# Patient Record
Sex: Male | Born: 1955
Health system: Southern US, Community
[De-identification: ages and names within clinical notes are randomized; demographics above are authoritative.]

## PROBLEM LIST (undated history)

## (undated) DIAGNOSIS — F419 Anxiety disorder, unspecified: Secondary | ICD-10-CM

## (undated) DIAGNOSIS — E782 Mixed hyperlipidemia: Secondary | ICD-10-CM

## (undated) DIAGNOSIS — I10 Essential (primary) hypertension: Secondary | ICD-10-CM

## (undated) DIAGNOSIS — F329 Major depressive disorder, single episode, unspecified: Secondary | ICD-10-CM

## (undated) DIAGNOSIS — K219 Gastro-esophageal reflux disease without esophagitis: Secondary | ICD-10-CM

## (undated) DIAGNOSIS — I5022 Chronic systolic (congestive) heart failure: Secondary | ICD-10-CM

## (undated) DIAGNOSIS — F32A Depression, unspecified: Secondary | ICD-10-CM

## (undated) DIAGNOSIS — E6609 Other obesity due to excess calories: Secondary | ICD-10-CM

## (undated) DIAGNOSIS — E119 Type 2 diabetes mellitus without complications: Secondary | ICD-10-CM

## (undated) DIAGNOSIS — I509 Heart failure, unspecified: Secondary | ICD-10-CM

## (undated) DIAGNOSIS — R9431 Abnormal electrocardiogram [ECG] [EKG]: Secondary | ICD-10-CM

## (undated) DIAGNOSIS — I2510 Atherosclerotic heart disease of native coronary artery without angina pectoris: Secondary | ICD-10-CM

## (undated) HISTORY — DX: Anxiety disorder, unspecified: F41.9

## (undated) HISTORY — DX: Gastro-esophageal reflux disease without esophagitis: K21.9

## (undated) HISTORY — DX: Mixed hyperlipidemia: E78.2

## (undated) HISTORY — PX: HAND SURGERY: SHX662

## (undated) HISTORY — PX: ANKLE FRACTURE SURGERY: SHX122

## (undated) HISTORY — DX: Depression, unspecified: F32.A

## (undated) HISTORY — DX: Atherosclerotic heart disease of native coronary artery without angina pectoris: I25.10

## (undated) HISTORY — DX: Essential (primary) hypertension: I10

## (undated) HISTORY — DX: Chronic systolic (congestive) heart failure: I50.22

## (undated) HISTORY — DX: Type 2 diabetes mellitus without complications: E11.9

## (undated) HISTORY — PX: CARDIAC CATHETERIZATION: SHX172

## (undated) HISTORY — PX: CHOLECYSTECTOMY: SHX55

## (undated) HISTORY — DX: Other obesity due to excess calories: E66.09

## (undated) HISTORY — DX: Abnormal electrocardiogram (ECG) (EKG): R94.31

## (undated) HISTORY — DX: Heart failure, unspecified: I50.9

---
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
## 2005-08-05 ENCOUNTER — Ambulatory Visit: Payer: Self-pay | Admitting: Internal Medicine

## 2005-08-12 ENCOUNTER — Ambulatory Visit: Payer: Self-pay | Admitting: Gastroenterology

## 2005-08-30 ENCOUNTER — Ambulatory Visit: Payer: Self-pay | Admitting: Gastroenterology

## 2005-09-05 ENCOUNTER — Ambulatory Visit: Payer: Self-pay | Admitting: Internal Medicine

## 2005-10-17 ENCOUNTER — Ambulatory Visit: Payer: Self-pay | Admitting: Internal Medicine

## 2005-10-21 ENCOUNTER — Ambulatory Visit: Payer: Self-pay | Admitting: Family Medicine

## 2005-12-23 ENCOUNTER — Ambulatory Visit: Payer: Self-pay | Admitting: Internal Medicine

## 2006-02-20 ENCOUNTER — Ambulatory Visit: Payer: Self-pay | Admitting: Internal Medicine

## 2006-05-21 ENCOUNTER — Ambulatory Visit: Payer: Self-pay | Admitting: Internal Medicine

## 2006-10-10 ENCOUNTER — Ambulatory Visit: Payer: Self-pay | Admitting: Internal Medicine

## 2006-11-14 ENCOUNTER — Ambulatory Visit: Payer: Self-pay | Admitting: Internal Medicine

## 2007-03-03 ENCOUNTER — Emergency Department: Payer: Self-pay | Admitting: Emergency Medicine

## 2007-03-19 ENCOUNTER — Emergency Department: Payer: Self-pay | Admitting: Emergency Medicine

## 2010-04-30 ENCOUNTER — Ambulatory Visit: Payer: Self-pay

## 2010-05-16 ENCOUNTER — Ambulatory Visit: Payer: Self-pay

## 2010-06-15 ENCOUNTER — Ambulatory Visit: Payer: Self-pay

## 2010-07-26 ENCOUNTER — Encounter (INDEPENDENT_AMBULATORY_CARE_PROVIDER_SITE_OTHER): Payer: Self-pay | Admitting: *Deleted

## 2011-01-15 NOTE — Letter (Signed)
Summary: Colonoscopy Letter  [facility]  [address] [place], [place] [address]   Phone: [phone]  Fax: [phone]      July 26, 2010 MRN: [id number]   [facility] [address] [place], [place]  [address]   Dear Mr. Chaput,   According to your medical record, it is time for you to schedule a Colonoscopy. The American Cancer Society recommends this procedure as a method to detect early colon cancer. Patients with a family history of colon cancer, or a personal history of colon polyps or inflammatory bowel disease are at increased risk.  This letter has beeen generated based on the recommendations made at the time of your procedure. If you feel that in your particular situation this may no longer apply, please contact our office.  Please call our office at [phone] to schedule this appointment or to update your records at your earliest convenience.  Thank you for cooperating with [place] to provide you with the very best care possible.   Sincerely,  Judie Petit T. Russella Dar, M.D.  [facility] [phone]

## 2012-08-19 ENCOUNTER — Encounter: Payer: Self-pay | Admitting: Gastroenterology

## 2013-12-16 HISTORY — PX: CORONARY STENT PLACEMENT: SHX1402

## 2014-07-01 ENCOUNTER — Emergency Department: Payer: Self-pay | Admitting: Emergency Medicine

## 2014-07-01 LAB — BASIC METABOLIC PANEL
Anion Gap: 5 — ABNORMAL LOW (ref 7–16)
BUN: 21 mg/dL — AB (ref 7–18)
CALCIUM: 8.5 mg/dL (ref 8.5–10.1)
Chloride: 108 mmol/L — ABNORMAL HIGH (ref 98–107)
Co2: 28 mmol/L (ref 21–32)
Creatinine: 1.18 mg/dL (ref 0.60–1.30)
EGFR (African American): >60
GLUCOSE: 103 mg/dL — AB (ref 65–99)
Osmolality: 284 (ref 275–301)
Potassium: 4.2 mmol/L (ref 3.5–5.1)
Sodium: 141 mmol/L (ref 136–145)

## 2014-07-01 LAB — CBC
HCT: 43 % (ref 40.0–52.0)
HGB: 14.1 g/dL (ref 13.0–18.0)
MCH: 26.8 pg (ref 26.0–34.0)
MCHC: 32.8 g/dL (ref 32.0–36.0)
MCV: 82 fL (ref 80–100)
Platelet: 178 10*3/uL (ref 150–440)
RBC: 5.26 10*6/uL (ref 4.40–5.90)
RDW: 14.8 % — AB (ref 11.5–14.5)
WBC: 10.2 10*3/uL (ref 3.8–10.6)

## 2014-07-01 LAB — TROPONIN I: TROPONIN-I: 0.02 ng/mL

## 2014-07-02 LAB — TROPONIN I

## 2014-07-02 LAB — D-DIMER(ARMC): D-DIMER: 341 ng/mL

## 2014-07-12 ENCOUNTER — Other Ambulatory Visit: Payer: Self-pay | Admitting: Cardiovascular Disease

## 2014-07-12 LAB — CREATININE, SERUM
CREATININE: 1.03 mg/dL (ref 0.60–1.30)
EGFR (Non-African Amer.): >60

## 2014-07-12 LAB — POTASSIUM: Potassium: 4.3 mmol/L (ref 3.5–5.1)

## 2014-07-12 LAB — PROTIME-INR
INR: 1
Prothrombin Time: 12.7 secs (ref 11.5–14.7)

## 2014-07-12 LAB — HEMOGLOBIN: HGB: 13.5 g/dL (ref 13.0–18.0)

## 2014-07-12 LAB — HEMATOCRIT: HCT: 40.9 % (ref 40.0–52.0)

## 2014-07-12 LAB — BUN: BUN: 21 mg/dL — ABNORMAL HIGH (ref 7–18)

## 2014-07-14 ENCOUNTER — Ambulatory Visit: Payer: Self-pay | Admitting: Cardiovascular Disease

## 2014-07-14 LAB — CK TOTAL AND CKMB (NOT AT ARMC)
CK, Total: 90 U/L
CK-MB: 1.4 ng/mL (ref 0.5–3.6)

## 2014-07-15 LAB — BASIC METABOLIC PANEL
Anion Gap: 6 — ABNORMAL LOW (ref 7–16)
BUN: 14 mg/dL (ref 7–18)
CHLORIDE: 111 mmol/L — AB (ref 98–107)
Calcium, Total: 8.3 mg/dL — ABNORMAL LOW (ref 8.5–10.1)
Co2: 27 mmol/L (ref 21–32)
Creatinine: 1.07 mg/dL (ref 0.60–1.30)
EGFR (African American): >60
Glucose: 106 mg/dL — ABNORMAL HIGH (ref 65–99)
OSMOLALITY: 288 (ref 275–301)
Potassium: 4.4 mmol/L (ref 3.5–5.1)
SODIUM: 144 mmol/L (ref 136–145)

## 2014-12-06 ENCOUNTER — Ambulatory Visit: Payer: Self-pay | Admitting: Internal Medicine

## 2014-12-20 DIAGNOSIS — E6609 Other obesity due to excess calories: Secondary | ICD-10-CM | POA: Insufficient documentation

## 2014-12-20 DIAGNOSIS — K219 Gastro-esophageal reflux disease without esophagitis: Secondary | ICD-10-CM | POA: Insufficient documentation

## 2014-12-20 DIAGNOSIS — F419 Anxiety disorder, unspecified: Secondary | ICD-10-CM | POA: Insufficient documentation

## 2014-12-20 DIAGNOSIS — E1169 Type 2 diabetes mellitus with other specified complication: Secondary | ICD-10-CM | POA: Insufficient documentation

## 2014-12-20 DIAGNOSIS — F32A Depression, unspecified: Secondary | ICD-10-CM | POA: Insufficient documentation

## 2015-01-13 DIAGNOSIS — E1159 Type 2 diabetes mellitus with other circulatory complications: Secondary | ICD-10-CM | POA: Insufficient documentation

## 2015-01-13 DIAGNOSIS — E1140 Type 2 diabetes mellitus with diabetic neuropathy, unspecified: Secondary | ICD-10-CM | POA: Insufficient documentation

## 2015-01-13 DIAGNOSIS — I2510 Atherosclerotic heart disease of native coronary artery without angina pectoris: Secondary | ICD-10-CM | POA: Insufficient documentation

## 2015-01-13 DIAGNOSIS — E119 Type 2 diabetes mellitus without complications: Secondary | ICD-10-CM | POA: Insufficient documentation

## 2015-01-13 DIAGNOSIS — I10 Essential (primary) hypertension: Secondary | ICD-10-CM | POA: Insufficient documentation

## 2015-01-13 DIAGNOSIS — I519 Heart disease, unspecified: Secondary | ICD-10-CM | POA: Insufficient documentation

## 2015-01-13 DIAGNOSIS — I5022 Chronic systolic (congestive) heart failure: Secondary | ICD-10-CM | POA: Insufficient documentation

## 2015-04-11 ENCOUNTER — Encounter
Admit: 2015-04-11 | Disposition: A | Discharge status: Home / Self Care | Payer: Self-pay | Attending: Internal Medicine | Admitting: Internal Medicine

## 2015-05-07 ENCOUNTER — Encounter: Payer: Self-pay | Admitting: *Deleted

## 2015-05-07 NOTE — Progress Notes (Signed)
Input data from previous EMR to update the Individualized Treatment Plan.

## 2015-05-08 ENCOUNTER — Encounter: Payer: Self-pay | Admitting: *Deleted

## 2015-05-09 ENCOUNTER — Encounter: Payer: Self-pay | Admitting: *Deleted

## 2015-05-09 NOTE — Progress Notes (Signed)
Cardiac Individual Treatment Plan  Patient Details  Name: Steven Vang MRN: [id number] Date of Birth: Dec 01, 1956 Referring Provider:Dr. Gala Lewandowsky Initial Encounter Date:  04/11/2015 Diagnosis PTCA  Patient's Home Medications on Admission: No current outpatient prescriptions on file.  Past Medical History: No past medical history on file.  Tobacco Use: History  Smoking status  . Not on file  Smokeless tobacco  . Not on file    Labs: Recent Review Flowsheet Data    There is no flowsheet data to display.       Exercise Target Goals:    Exercise Program Goal: Individual exercise prescription set with THRR, safety & activity barriers. Participant demonstrates ability to understand and report RPE using BORG scale, to self-measure pulse accurately, and to acknowledge the importance of the exercise prescription.  Exercise Prescription Goal: Starting with aerobic activity 30 plus minutes a day, 3 days per week for initial exercise prescription. Provide home exercise prescription and guidelines that participant acknowledges understanding prior to discharge.  Activity Barriers & Risk Stratification:     Activity Barriers & Risk Stratification - 05/07/15 1042    Activity Barriers & Risk Stratification   Activity Barriers Other (comment)   Comments both ankle   Risk Stratification High      6 Minute Walk:     6 Minute Walk      04/11/15 1043       6 Minute Walk   Phase Initial     Distance 1360 feet     Walk Time 6 minutes     Resting HR 71 bpm     Resting BP 110/74 mmHg     Max Ex. HR 110 bpm     Max Ex. BP 140/80 mmHg     RPE 9     Symptoms No        Initial Exercise Prescription:   Exercise Prescription Changes:     Exercise Prescription Changes      05/08/15 1400           Exercise Review   Progression No  Has not started program, on waiting list          Discharge Exercise Prescription:   Nutrition:  Target Goals: Understanding of  nutrition guidelines, daily intake of sodium 1500mg , cholesterol 200mg , calories 30% from fat and 7% or less from saturated fats, daily to have 5 or more servings of fruits and vegetables.  Biometrics:     Pre Biometrics - 04/11/15 1044    Pre Biometrics   Height 5' 10.5" (1.791 m)   Weight (!) 300 lb 14.4 oz (136.487 kg)   Waist Circumference 53.25 inches   Hip Circumference 52 inches   Waist to Hip Ratio 1.02 %   BMI (Calculated) 42.7       Nutrition Therapy Plan and Nutrition Goals:   Nutrition Discharge: Rate Your Plate Scores:   Nutrition Goals Re-Evaluation:   Psychosocial: Target Goals: Acknowledge presence or absence of depression, maximize coping skills, provide positive support system. Participant is able to verbalize types and ability to use techniques and skills needed for reducing stress and depression.  Initial Review & Psychosocial Screening:   Quality of Life Scores:     Quality of Life - 04/11/15 1047    Quality of Life Scores   Health/Function Pre 10.43 %   Socioeconomic Pre 17.07 %   Psych/Spiritual Pre 10.64 %   Family Pre 9.6 %   GLOBAL Pre 11.72 %      PHQ-9:  Recent Review Flowsheet Data    Depression screen [facility] 2/9 04/11/2015   Decreased Interest 3   Down, Depressed, Hopeless 2   PHQ - 2 Score 5   Altered sleeping 2   Tired, decreased energy 3   Change in appetite 2   Feeling bad or failure about yourself  2   Trouble concentrating 3   Moving slowly or fidgety/restless 1   Suicidal thoughts 0   PHQ-9 Score 18   Difficult doing work/chores Very difficult      Psychosocial Evaluation and Intervention:   Psychosocial Re-Evaluation:   Vocational Rehabilitation: Provide vocational rehab assistance to qualifying candidates.   Vocational Rehab Evaluation & Intervention:     Vocational Rehab - 05/07/15 1043    Initial Vocational Rehab Evaluation & Intervention   Assessment shows need for Vocational Rehabilitation No       Education: Education Goals: Education classes will be provided on a weekly basis, covering required topics. Participant will state understanding/return demonstration of topics presented.  Learning Barriers/Preferences:     Learning Barriers/Preferences - 05/07/15 1042    Learning Barriers/Preferences   Learning Barriers None   Learning Preferences None      Education Topics: General Nutrition Guidelines/Fats and Fiber: -Group instruction provided by verbal, written material, models and posters to present the general guidelines for heart healthy nutrition. Gives an explanation and review of dietary fats and fiber.   Controlling Sodium/Reading Food Labels: -Group verbal and written material supporting the discussion of sodium use in heart healthy nutrition. Review and explanation with models, verbal and written materials for utilization of the food label.   Exercise Physiology & Risk Factors: - Group verbal and written instruction with models to review the exercise physiology of the cardiovascular system and associated critical values. Details cardiovascular disease risk factors and the goals associated with each risk factor.   Aerobic Exercise & Resistance Training: - Gives group verbal and written discussion on the health impact of inactivity. On the components of aerobic and resistive training programs and the benefits of this training and how to safely progress through these programs.   Flexibility, Balance, General Exercise Guidelines: - Provides group verbal and written instruction on the benefits of flexibility and balance training programs. Provides general exercise guidelines with specific guidelines to those with heart or lung disease. Demonstration and skill practice provided.   Stress Management: - Provides group verbal and written instruction about the health risks of elevated stress, cause of high stress, and healthy ways to reduce stress.   Depression: -  Provides group verbal and written instruction on the correlation between heart/lung disease and depressed mood, treatment options, and the stigmas associated with seeking treatment.   Anatomy & Physiology of the Heart: - Group verbal and written instruction and models provide basic cardiac anatomy and physiology, with the coronary electrical and arterial systems. Review of: AMI, Angina, Valve disease, Heart Failure, Cardiac Arrhythmia, Pacemakers, and the ICD.   Cardiac Procedures: - Group verbal and written instruction and models to describe the testing methods done to diagnose heart disease. Reviews the outcomes of the test results. Describes the treatment choices: Medical Management, Angioplasty, or Coronary Bypass Surgery.   Cardiac Medications: - Group verbal and written instruction to review commonly prescribed medications for heart disease. Reviews the medication, class of the drug, and side effects. Includes the steps to properly store meds and maintain the prescription regimen.   Go Sex-Intimacy & Heart Disease, Get SMART - Goal Setting: - Group verbal and written  instruction through game format to discuss heart disease and the return to sexual intimacy. Provides group verbal and written material to discuss and apply goal setting through the application of the S.M.A.R.T. Method.   Other Matters of the Heart: - Provides group verbal, written materials and models to describe Heart Failure, Angina, Valve Disease, and Diabetes in the realm of heart disease. Includes description of the disease process and treatment options available to the cardiac patient.   Exercise & Equipment Safety: - Individual verbal instruction and demonstration of equipment use and safety with use of the equipment.   Infection Prevention: - Provides verbal and written material to individual with discussion of infection control including proper hand washing and proper equipment cleaning during exercise  session.   Falls Prevention: - Provides verbal and written material to individual with discussion of falls prevention and safety.   Diabetes: - Individual verbal and written instruction to review signs/symptoms of diabetes, desired ranges of glucose level fasting, after meals and with exercise. Advice that pre and post exercise glucose checks will be done for 3 sessions at entry of program.    Knowledge Questionnaire Score:     Knowledge Questionnaire Score - 04/11/15 1042    Knowledge Questionnaire Score   Pre Score 23/28      Personal Goals and Risk Factors at Admission:     Personal Goals and Risk Factors at Admission - 05/07/15 1046    Personal Goals and Risk Factors on Admission    Weight Management Yes;Obesity   Intervention Learn and follow the exercise and diet guidelines while in the program. Utilize the nutrition and education classes to help gain knowledge of the diet and exercise expectations in the program   Intervention Provide weight management tools through evaluation completed by registered dietician and exercise physiologist.  Establish a goal weight with participant.   Increase Aerobic Exercise and Physical Activity Yes   Intervention While in program, learn and follow the exercise prescription taught. Start at a low level workload and increase workload after able to maintain previous level for 30 minutes. Increase time before increasing intensity.   Understand more about Heart/Pulmonary Disease. Yes   Intervention While in program utilize professionals for any questions, and attend the education sessions. Great websites to use are www.americanheart.org or www.lung.org for reliable information.   Diabetes Yes   Goal Blood glucose control identified by blood glucose values, HgbA1C. Participant verbalizes understanding of the signs/symptoms of hyper/hypo glycemia, proper foot care and importance of medication and nutrition plan for blood glucose control.    Intervention Provide nutrition & aerobic exercise along with prescribed medications to achieve blood glucose in normal ranges: Fasting 65-99 mg/dL   Hypertension Yes   Goal Participant will see blood pressure controlled within the values of 140/71mm/Hg or within value directed by their physician.   Intervention Provide nutrition & aerobic exercise along with prescribed medications to achieve BP 140/90 or less.   Lipids Yes   Goal Cholesterol controlled with medications as prescribed, with individualized exercise RX and with personalized nutrition plan. Value goals: LDL < 70mg , HDL > 40mg . Participant states understanding of desired cholesterol values and following prescriptions.   Intervention Provide nutrition & aerobic exercise along with prescribed medications to achieve LDL 70mg , HDL >40mg .      Personal Goals and Risk Factors Review:    Personal Goals Discharge:     Comments: 30 day review   Ready to start program this week.

## 2015-05-11 ENCOUNTER — Other Ambulatory Visit: Payer: Self-pay | Admitting: *Deleted

## 2015-05-11 DIAGNOSIS — Z9861 Coronary angioplasty status: Secondary | ICD-10-CM

## 2015-05-17 ENCOUNTER — Telehealth: Payer: Self-pay | Admitting: *Deleted

## 2015-05-17 ENCOUNTER — Encounter: Payer: BLUE CROSS/BLUE SHIELD | Attending: Internal Medicine

## 2015-05-17 DIAGNOSIS — Z9861 Coronary angioplasty status: Secondary | ICD-10-CM | POA: Insufficient documentation

## 2015-05-17 NOTE — Telephone Encounter (Signed)
called Steven Vang and he got stuck at work but hopes to be here Monday.

## 2015-05-22 DIAGNOSIS — Z9861 Coronary angioplasty status: Secondary | ICD-10-CM | POA: Diagnosis not present

## 2015-05-22 NOTE — Progress Notes (Signed)
Daily Session Note  Patient Details  Name: Steven Vang MRN: [id number] Date of Birth: January 20, 1956 Referring Provider:  Alison Stalling, MD  Encounter Date: 05/22/2015  Check In:     Session Check In - 05/22/15 1628    Check-In   Staff Present Heath Lark RN, BSN, CCRP;Truth Wolaver BS, ACSM EP-C, Exercise Physiologist;Carroll Enterkin RN, BSN   ER physicians immediately available to respond to emergencies See telemetry face sheet for immediately available ER MD   Medication changes reported     No   Fall or balance concerns reported    No   Warm-up and Cool-down Performed on first and last piece of equipment   VAD Patient? No   Pain Assessment   Currently in Pain? No/denies         Goals Met:  Proper associated with RPD/PD & O2 Sat Exercise tolerated well No report of cardiac concerns or symptoms Strength training completed today  Goals Unmet:  Not Applicable  Goals Comments:    Dr. Emily Filbert is Medical Director for [facility].

## 2015-06-03 ENCOUNTER — Other Ambulatory Visit: Payer: Self-pay | Admitting: Family Medicine

## 2015-06-06 ENCOUNTER — Encounter: Payer: Self-pay | Admitting: *Deleted

## 2015-06-06 DIAGNOSIS — Z9861 Coronary angioplasty status: Secondary | ICD-10-CM

## 2015-06-06 NOTE — Progress Notes (Signed)
Cardiac Individual Treatment Plan  Patient Details  Name: Steven Vang MRN: [id number] Date of Birth: 03-Jul-1956 Referring Provider:  Dr. Keturah Vang. Steven Vang Initial Encounter Date:  04/11/2015  Visit Diagnosis: S/P PTCA (percutaneous transluminal coronary angioplasty)  Patient's Home Medications on Admission:  Current outpatient prescriptions:  .  QUEtiapine (SEROQUEL) 50 MG tablet, TAKE 1-2 TABLETS BY MOUTH AT BEDTIME, Disp: 45 tablet, Rfl: 3  Past Medical History: No past medical history on file.  Tobacco Use: History  Smoking status  . Not on file  Smokeless tobacco  . Not on file    Labs: Recent Review Flowsheet Data    There is no flowsheet data to display.       Exercise Target Goals:    Exercise Program Goal: Individual exercise prescription set with THRR, safety & activity barriers. Participant demonstrates ability to understand and report RPE using BORG scale, to self-measure pulse accurately, and to acknowledge the importance of the exercise prescription.  Exercise Prescription Goal: Starting with aerobic activity 30 plus minutes a day, 3 days per week for initial exercise prescription. Provide home exercise prescription and guidelines that participant acknowledges understanding prior to discharge.  Activity Barriers & Risk Stratification:     Activity Barriers & Risk Stratification - 05/07/15 1042    Activity Barriers & Risk Stratification   Activity Barriers Other (comment)   Comments both ankle   Risk Stratification High      6 Minute Walk:     6 Minute Walk      04/11/15 1043       6 Minute Walk   Phase Initial     Distance 1360 feet     Walk Time 6 minutes     Resting HR 71 bpm     Resting BP 110/74 mmHg     Max Ex. HR 110 bpm     Max Ex. BP 140/80 mmHg     RPE 9     Symptoms No        Initial Exercise Prescription:   Exercise Prescription Changes:     Exercise Prescription Changes      05/08/15 1400 06/06/15 0700          Exercise Review   Progression No  Has not started program, on waiting list No  Only one visit recorded in last review period      Response to Exercise   Blood Pressure (Admit)  124/72 mmHg      Blood Pressure (Exercise)  144/72 mmHg      Blood Pressure (Exit)  138/88 mmHg      Heart Rate (Admit)  69 bpm      Heart Rate (Exercise)  99 bpm      Heart Rate (Exit)  76 bpm      Rating of Perceived Exertion (Exercise)  11      Duration  Progress to 30 minutes of continuous aerobic without signs/symptoms of physical distress      Intensity  Rest + 30      Progression  Continue progressive overload as per policy without signs/symptoms or physical distress.      Resistance Training   Training Prescription  Yes      Weight  2      Reps  10-15      Interval Training   Interval Training  No      Recumbant Elliptical   Level  2      Watts  25      Minutes  20         Discharge Exercise Prescription (Final Exercise Prescription Changes):     Exercise Prescription Changes - 06/06/15 0700    Exercise Review   Progression No  Only one visit recorded in last review period   Response to Exercise   Blood Pressure (Admit) 124/72 mmHg   Blood Pressure (Exercise) 144/72 mmHg   Blood Pressure (Exit) 138/88 mmHg   Heart Rate (Admit) 69 bpm   Heart Rate (Exercise) 99 bpm   Heart Rate (Exit) 76 bpm   Rating of Perceived Exertion (Exercise) 11   Duration Progress to 30 minutes of continuous aerobic without signs/symptoms of physical distress   Intensity Rest + 30   Progression Continue progressive overload as per policy without signs/symptoms or physical distress.   Resistance Training   Training Prescription Yes   Weight 2   Reps 10-15   Interval Training   Interval Training No   Recumbant Elliptical   Level 2   Watts 25   Minutes 20      Nutrition:  Target Goals: Understanding of nutrition guidelines, daily intake of sodium <1517m, cholesterol <2036m calories 30% from fat and 7%  or less from saturated fats, daily to have 5 or more servings of fruits and vegetables.  Biometrics:     Pre Biometrics - 04/11/15 1044    Pre Biometrics   Height 5' 10.5" (1.791 m)   Weight (!) 300 lb 14.4 oz (136.487 kg)   Waist Circumference 53.25 inches   Hip Circumference 52 inches   Waist to Hip Ratio 1.02 %   BMI (Calculated) 42.7       Nutrition Therapy Plan and Nutrition Goals:   Nutrition Discharge: Rate Your Plate Scores:   Nutrition Goals Re-Evaluation:   Psychosocial: Target Goals: Acknowledge presence or absence of depression, maximize coping skills, provide positive support system. Participant is able to verbalize types and ability to use techniques and skills needed for reducing stress and depression.  Initial Review & Psychosocial Screening:   Quality of Life Scores:     Quality of Life - 04/11/15 1047    Quality of Life Scores   Health/Function Pre 10.43 %   Socioeconomic Pre 17.07 %   Psych/Spiritual Pre 10.64 %   Family Pre 9.6 %   GLOBAL Pre 11.72 %      PHQ-9:     Recent Review Flowsheet Data    [facility]   Decreased Interest 3   Down, Depressed, Hopeless 2   PHQ - 2 Score 5   Altered sleeping 2   Tired, decreased energy 3   Change in appetite 2   Feeling bad or failure about yourself  2   Trouble concentrating 3   Moving slowly or fidgety/restless 1   Suicidal thoughts 0   PHQ-9 Score 18   Difficult doing work/chores Very difficult      Psychosocial Evaluation and Intervention:     Psychosocial Evaluation - 05/22/15 1733    Psychosocial Evaluation & Interventions   Interventions Stress management education;Therapist referral;Relaxation education;Encouraged to exercise with the program and follow exercise prescription   Comments Counselor met with Mr. Steven Vang for initial psychosocial evaluation.  He is a [age]ear old gentleman who has had 3 stents inserted in the past year and reports having  gained over 80 pounds in the process.  Mr. Steven Vang a strong support system with a significant other, a daughter who lives close by and is actively involved  in his faith community.  He admits he has struggled with depression and the medications prescribed are somewhat helpful, but he continues to report not sleeping well and under a great deal of stress.  Counselor recommended decreasing or eliminating caffeine from diet, exercising more consistently, attending the psychoeducational components of this program  (stress management and depression), contacting a counselor for individual therapy (information provided to client); and speaking with Dr. about a possible change in medication due to excessive weight gain over the past year.  Counselor will follow up with Mr. Glendora Score  as needed.   Continued Psychosocial Services Needed --  Attend and participate in the psychoeducational components of this program - stress management, depression and regularly practice the relaxation techniques taught.  Also, meet with dietician to work on weight loss goals.        Psychosocial Re-Evaluation:   Vocational Rehabilitation: Provide vocational rehab assistance to qualifying candidates.   Vocational Rehab Evaluation & Intervention:     Vocational Rehab - 05/07/15 1043    Initial Vocational Rehab Evaluation & Intervention   Assessment shows need for Vocational Rehabilitation No      Education: Education Goals: Education classes will be provided on a weekly basis, covering required topics. Participant will state understanding/return demonstration of topics presented.  Learning Barriers/Preferences:     Learning Barriers/Preferences - 05/07/15 1042    Learning Barriers/Preferences   Learning Barriers None   Learning Preferences None      Education Topics: General Nutrition Guidelines/Fats and Fiber: -Group instruction provided by verbal, written material, models and posters to present the general  guidelines for heart healthy nutrition. Gives an explanation and review of dietary fats and fiber.   Controlling Sodium/Reading Food Labels: -Group verbal and written material supporting the discussion of sodium use in heart healthy nutrition. Review and explanation with models, verbal and written materials for utilization of the food label.   Exercise Physiology & Risk Factors: - Group verbal and written instruction with models to review the exercise physiology of the cardiovascular system and associated critical values. Details cardiovascular disease risk factors and the goals associated with each risk factor.   Aerobic Exercise & Resistance Training: - Gives group verbal and written discussion on the health impact of inactivity. On the components of aerobic and resistive training programs and the benefits of this training and how to safely progress through these programs.   Flexibility, Balance, General Exercise Guidelines: - Provides group verbal and written instruction on the benefits of flexibility and balance training programs. Provides general exercise guidelines with specific guidelines to those with heart or lung disease. Demonstration and skill practice provided.   Stress Management: - Provides group verbal and written instruction about the health risks of elevated stress, cause of high stress, and healthy ways to reduce stress.   Depression: - Provides group verbal and written instruction on the correlation between heart/lung disease and depressed mood, treatment options, and the stigmas associated with seeking treatment.   Anatomy & Physiology of the Heart: - Group verbal and written instruction and models provide basic cardiac anatomy and physiology, with the coronary electrical and arterial systems. Review of: AMI, Angina, Valve disease, Heart Failure, Cardiac Arrhythmia, Pacemakers, and the ICD.   Cardiac Procedures: - Group verbal and written instruction and models to  describe the testing methods done to diagnose heart disease. Reviews the outcomes of the test results. Describes the treatment choices: Medical Management, Angioplasty, or Coronary Bypass Surgery.   Cardiac Medications: - Group verbal and written  instruction to review commonly prescribed medications for heart disease. Reviews the medication, class of the drug, and side effects. Includes the steps to properly store meds and maintain the prescription regimen.   Go Sex-Intimacy & Heart Disease, Get SMART - Goal Setting: - Group verbal and written instruction through game format to discuss heart disease and the return to sexual intimacy. Provides group verbal and written material to discuss and apply goal setting through the application of the S.M.A.R.T. Method.   Other Matters of the Heart: - Provides group verbal, written materials and models to describe Heart Failure, Angina, Valve Disease, and Diabetes in the realm of heart disease. Includes description of the disease process and treatment options available to the cardiac patient.   Exercise & Equipment Safety: - Individual verbal instruction and demonstration of equipment use and safety with use of the equipment.   Infection Prevention: - Provides verbal and written material to individual with discussion of infection control including proper hand washing and proper equipment cleaning during exercise session.   Falls Prevention: - Provides verbal and written material to individual with discussion of falls prevention and safety.   Diabetes: - Individual verbal and written instruction to review signs/symptoms of diabetes, desired ranges of glucose level fasting, after meals and with exercise. Advice that pre and post exercise glucose checks will be done for 3 sessions at entry of program.    Knowledge Questionnaire Score:     Knowledge Questionnaire Score - 04/11/15 1042    Knowledge Questionnaire Score   Pre Score 23/28       Personal Goals and Risk Factors at Admission:     Personal Goals and Risk Factors at Admission - 05/07/15 1046    Personal Goals and Risk Factors on Admission    Weight Management Yes;Obesity   Intervention Learn and follow the exercise and diet guidelines while in the program. Utilize the nutrition and education classes to help gain knowledge of the diet and exercise expectations in the program   Intervention Provide weight management tools through evaluation completed by registered dietician and exercise physiologist.  Establish a goal weight with participant.   Increase Aerobic Exercise and Physical Activity Yes   Intervention While in program, learn and follow the exercise prescription taught. Start at a low level workload and increase workload after able to maintain previous level for 30 minutes. Increase time before increasing intensity.   Understand more about Heart/Pulmonary Disease. Yes   Intervention While in program utilize professionals for any questions, and attend the education sessions. Great websites to use are www.americanheart.org or www.lung.org for reliable information.   Diabetes Yes   Goal Blood glucose control identified by blood glucose values, HgbA1C. Participant verbalizes understanding of the signs/symptoms of hyper/hypo glycemia, proper foot care and importance of medication and nutrition plan for blood glucose control.   Intervention Provide nutrition & aerobic exercise along with prescribed medications to achieve blood glucose in normal ranges: Fasting 65-99 mg/dL   Hypertension Yes   Goal Participant will see blood pressure controlled within the values of 140/61m/Hg or within value directed by their physician.   Intervention Provide nutrition & aerobic exercise along with prescribed medications to achieve BP 140/90 or less.   Lipids Yes   Goal Cholesterol controlled with medications as prescribed, with individualized exercise RX and with personalized nutrition  plan. Value goals: LDL < 75m HDL > 4025mParticipant states understanding of desired cholesterol values and following prescriptions.   Intervention Provide nutrition & aerobic exercise along with prescribed  medications to achieve LDL <20m, HDL >462m      Personal Goals and Risk Factors Review:    Personal Goals Discharge:     Comments: 30 day review. Continue with ITP.  Has attended 2 sessions last on 05/22/2015

## 2015-06-07 ENCOUNTER — Other Ambulatory Visit: Payer: Self-pay | Admitting: *Deleted

## 2015-06-07 DIAGNOSIS — Z9861 Coronary angioplasty status: Secondary | ICD-10-CM

## 2015-06-21 ENCOUNTER — Encounter: Payer: BLUE CROSS/BLUE SHIELD | Attending: Internal Medicine

## 2015-06-21 DIAGNOSIS — Z9861 Coronary angioplasty status: Secondary | ICD-10-CM | POA: Insufficient documentation

## 2015-06-26 ENCOUNTER — Encounter: Payer: Self-pay | Admitting: *Deleted

## 2015-06-26 ENCOUNTER — Other Ambulatory Visit: Payer: Self-pay | Admitting: Family Medicine

## 2015-06-30 ENCOUNTER — Telehealth: Payer: Self-pay | Admitting: *Deleted

## 2015-06-30 ENCOUNTER — Encounter: Payer: Self-pay | Admitting: *Deleted

## 2015-06-30 DIAGNOSIS — Z9861 Coronary angioplasty status: Secondary | ICD-10-CM

## 2015-06-30 NOTE — Telephone Encounter (Signed)
I called Steven Vang and he wants to return to Cardiac Rehab has been busy at work. Hopes to return this Wed at 3:45pm.

## 2015-07-04 ENCOUNTER — Encounter: Payer: Self-pay | Admitting: *Deleted

## 2015-07-04 DIAGNOSIS — Z9861 Coronary angioplasty status: Secondary | ICD-10-CM

## 2015-07-04 NOTE — Progress Notes (Signed)
Cardiac Individual Treatment Plan  Patient Details  Name: Steven Vang MRN: [id number] Date of Birth: 1956-05-08 Referring Provider:  Alison Stalling, MD  Initial Encounter Date:    Visit Diagnosis: S/P PTCA (percutaneous transluminal coronary angioplasty)  Patient's Home Medications on Admission:  Current outpatient prescriptions:  .  QUEtiapine (SEROQUEL) 50 MG tablet, TAKE 1-2 TABLETS BY MOUTH AT BEDTIME, Disp: 45 tablet, Rfl: 3 .  VIAGRA 100 MG tablet, TAKE 1/2 TABLET (50MG) BY MOUTH 4 HOURS PRIOR TO INTERCOURSE MAX OF 1DAILY, Disp: 8 tablet, Rfl: 2  Past Medical History: No past medical history on file.  Tobacco Use: History  Smoking status  . Not on file  Smokeless tobacco  . Not on file    Labs: Recent Review Flowsheet Data    There is no flowsheet data to display.       Exercise Target Goals:    Exercise Program Goal: Individual exercise prescription set with THRR, safety & activity barriers. Participant demonstrates ability to understand and report RPE using BORG scale, to self-measure pulse accurately, and to acknowledge the importance of the exercise prescription.  Exercise Prescription Goal: Starting with aerobic activity 30 plus minutes a day, 3 days per week for initial exercise prescription. Provide home exercise prescription and guidelines that participant acknowledges understanding prior to discharge.  Activity Barriers & Risk Stratification:     Activity Barriers & Risk Stratification - 05/07/15 1042    Activity Barriers & Risk Stratification   Activity Barriers Other (comment)   Comments both ankle   Risk Stratification High      6 Minute Walk:     6 Minute Walk      04/11/15 1043       6 Minute Walk   Phase Initial     Distance 1360 feet     Walk Time 6 minutes     Resting HR 71 bpm     Resting BP 110/74 mmHg     Max Ex. HR 110 bpm     Max Ex. BP 140/80 mmHg     RPE 9     Symptoms No        Initial Exercise  Prescription:   Exercise Prescription Changes:     Exercise Prescription Changes      05/08/15 1400 06/06/15 0700 06/26/15 0700       Exercise Review   Progression No  Has not started program, on waiting list No  Only one visit recorded in last review period No  Absent since last review, only 1 visit recorded total     Response to Exercise   Blood Pressure (Admit)  124/72 mmHg      Blood Pressure (Exercise)  144/72 mmHg      Blood Pressure (Exit)  138/88 mmHg      Heart Rate (Admit)  69 bpm      Heart Rate (Exercise)  99 bpm      Heart Rate (Exit)  76 bpm      Rating of Perceived Exertion (Exercise)  11      Duration  Progress to 30 minutes of continuous aerobic without signs/symptoms of physical distress      Intensity  Rest + 30      Progression  Continue progressive overload as per policy without signs/symptoms or physical distress.      Resistance Training   Training Prescription  Yes      Weight  2      Reps  10-15  Interval Training   Interval Training  No      Recumbant Elliptical   Level  2      Watts  25      Minutes  20         Discharge Exercise Prescription (Final Exercise Prescription Changes):     Exercise Prescription Changes - 06/26/15 0700    Exercise Review   Progression No  Absent since last review, only 1 visit recorded total      Nutrition:  Target Goals: Understanding of nutrition guidelines, daily intake of sodium <1545m, cholesterol <2028m calories 30% from fat and 7% or less from saturated fats, daily to have 5 or more servings of fruits and vegetables.  Biometrics:     Pre Biometrics - 04/11/15 1044    Pre Biometrics   Height 5' 10.5" (1.791 m)   Weight (!) 300 lb 14.4 oz (136.487 kg)   Waist Circumference 53.25 inches   Hip Circumference 52 inches   Waist to Hip Ratio 1.02 %   BMI (Calculated) 42.7       Nutrition Therapy Plan and Nutrition Goals:   Nutrition Discharge: Rate Your Plate Scores:   Nutrition Goals  Re-Evaluation:     Nutrition Goals Re-Evaluation      06/30/15 1534           Personal Goal #1 Re-Evaluation   Personal Goal #1 Unable to attend. Very busy at work.        Goal Progress Seen No          Psychosocial: Target Goals: Acknowledge presence or absence of depression, maximize coping skills, provide positive support system. Participant is able to verbalize types and ability to use techniques and skills needed for reducing stress and depression.  Initial Review & Psychosocial Screening:   Quality of Life Scores:     Quality of Life - 04/11/15 1047    Quality of Life Scores   Health/Function Pre 10.43 %   Socioeconomic Pre 17.07 %   Psych/Spiritual Pre 10.64 %   Family Pre 9.6 %   GLOBAL Pre 11.72 %      PHQ-9:     Recent Review Flowsheet Data    [facility] 04/11/2015   Decreased Interest 3   Down, Depressed, Hopeless 2   PHQ - 2 Score 5   Altered sleeping 2   Tired, decreased energy 3   Change in appetite 2   Feeling bad or failure about yourself  2   Trouble concentrating 3   Moving slowly or fidgety/restless 1   Suicidal thoughts 0   PHQ-9 Score 18   Difficult doing work/chores Very difficult      Psychosocial Evaluation and Intervention:     Psychosocial Evaluation - 05/22/15 1733    Psychosocial Evaluation & Interventions   Interventions Stress management education;Therapist referral;Relaxation education;Encouraged to exercise with the program and follow exercise prescription   Comments Counselor met with Mr. Steven Vang Scoreoday for initial psychosocial evaluation.  He is a 89ear old gentleman who has had 3 stents inserted in the past year and reports having gained over 80 pounds in the process.  Mr. Steven Vang Scoreas a strong support system with a significant other, a daughter who lives close by and is actively involved in his faith community.  He admits he has struggled with depression and the medications prescribed are somewhat helpful, but  he continues to report not sleeping well and under a great deal of stress.  Counselor recommended decreasing  or eliminating caffeine from diet, exercising more consistently, attending the psychoeducational components of this program  (stress management and depression), contacting a counselor for individual therapy (information provided to client); and speaking with Dr. about a possible change in medication due to excessive weight gain over the past year.  Counselor will follow up with Mr. Glendora Score  as needed.   Continued Psychosocial Services Needed --  Attend and participate in the psychoeducational components of this program - stress management, depression and regularly practice the relaxation techniques taught.  Also, meet with dietician to work on weight loss goals.        Psychosocial Re-Evaluation:   Vocational Rehabilitation: Provide vocational rehab assistance to qualifying candidates.   Vocational Rehab Evaluation & Intervention:     Vocational Rehab - 05/07/15 1043    Initial Vocational Rehab Evaluation & Intervention   Assessment shows need for Vocational Rehabilitation No      Education: Education Goals: Education classes will be provided on a weekly basis, covering required topics. Participant will state understanding/return demonstration of topics presented.  Learning Barriers/Preferences:     Learning Barriers/Preferences - 05/07/15 1042    Learning Barriers/Preferences   Learning Barriers None   Learning Preferences None      Education Topics: General Nutrition Guidelines/Fats and Fiber: -Group instruction provided by verbal, written material, models and posters to present the general guidelines for heart healthy nutrition. Gives an explanation and review of dietary fats and fiber.   Controlling Sodium/Reading Food Labels: -Group verbal and written material supporting the discussion of sodium use in heart healthy nutrition. Review and explanation with models,  verbal and written materials for utilization of the food label.   Exercise Physiology & Risk Factors: - Group verbal and written instruction with models to review the exercise physiology of the cardiovascular system and associated critical values. Details cardiovascular disease risk factors and the goals associated with each risk factor.   Aerobic Exercise & Resistance Training: - Gives group verbal and written discussion on the health impact of inactivity. On the components of aerobic and resistive training programs and the benefits of this training and how to safely progress through these programs.   Flexibility, Balance, General Exercise Guidelines: - Provides group verbal and written instruction on the benefits of flexibility and balance training programs. Provides general exercise guidelines with specific guidelines to those with heart or lung disease. Demonstration and skill practice provided.   Stress Management: - Provides group verbal and written instruction about the health risks of elevated stress, cause of high stress, and healthy ways to reduce stress.   Depression: - Provides group verbal and written instruction on the correlation between heart/lung disease and depressed mood, treatment options, and the stigmas associated with seeking treatment.   Anatomy & Physiology of the Heart: - Group verbal and written instruction and models provide basic cardiac anatomy and physiology, with the coronary electrical and arterial systems. Review of: AMI, Angina, Valve disease, Heart Failure, Cardiac Arrhythmia, Pacemakers, and the ICD.          Cardiac Rehab from 05/22/2015 in [facility]   Date  05/22/15   Educator  SB   Instruction Review Code  2- meets goals/outcomes      Cardiac Procedures: - Group verbal and written instruction and models to describe the testing methods done to diagnose heart disease. Reviews the outcomes of the test results. Describes the treatment  choices: Medical Management, Angioplasty, or Coronary Bypass Surgery.   Cardiac Medications: - Group verbal and  written instruction to review commonly prescribed medications for heart disease. Reviews the medication, class of the drug, and side effects. Includes the steps to properly store meds and maintain the prescription regimen.   Go Sex-Intimacy & Heart Disease, Get SMART - Goal Setting: - Group verbal and written instruction through game format to discuss heart disease and the return to sexual intimacy. Provides group verbal and written material to discuss and apply goal setting through the application of the S.M.A.R.T. Method.   Other Matters of the Heart: - Provides group verbal, written materials and models to describe Heart Failure, Angina, Valve Disease, and Diabetes in the realm of heart disease. Includes description of the disease process and treatment options available to the cardiac patient.   Exercise & Equipment Safety: - Individual verbal instruction and demonstration of equipment use and safety with use of the equipment.   Infection Prevention: - Provides verbal and written material to individual with discussion of infection control including proper hand washing and proper equipment cleaning during exercise session.   Falls Prevention: - Provides verbal and written material to individual with discussion of falls prevention and safety.   Diabetes: - Individual verbal and written instruction to review signs/symptoms of diabetes, desired ranges of glucose level fasting, after meals and with exercise. Advice that pre and post exercise glucose checks will be done for 3 sessions at entry of program.    Knowledge Questionnaire Score:     Knowledge Questionnaire Score - 04/11/15 1042    Knowledge Questionnaire Score   Pre Score 23/28      Personal Goals and Risk Factors at Admission:     Personal Goals and Risk Factors at Admission - 05/07/15 1046    Personal  Goals and Risk Factors on Admission    Weight Management Yes;Obesity   Intervention Learn and follow the exercise and diet guidelines while in the program. Utilize the nutrition and education classes to help gain knowledge of the diet and exercise expectations in the program   Intervention Provide weight management tools through evaluation completed by registered dietician and exercise physiologist.  Establish a goal weight with participant.   Increase Aerobic Exercise and Physical Activity Yes   Intervention While in program, learn and follow the exercise prescription taught. Start at a low level workload and increase workload after able to maintain previous level for 30 minutes. Increase time before increasing intensity.   Understand more about Heart/Pulmonary Disease. Yes   Intervention While in program utilize professionals for any questions, and attend the education sessions. Great websites to use are www.americanheart.org or www.lung.org for reliable information.   Diabetes Yes   Goal Blood glucose control identified by blood glucose values, HgbA1C. Participant verbalizes understanding of the signs/symptoms of hyper/hypo glycemia, proper foot care and importance of medication and nutrition plan for blood glucose control.   Intervention Provide nutrition & aerobic exercise along with prescribed medications to achieve blood glucose in normal ranges: Fasting 65-99 mg/dL   Hypertension Yes   Goal Participant will see blood pressure controlled within the values of 140/26m/Hg or within value directed by their physician.   Intervention Provide nutrition & aerobic exercise along with prescribed medications to achieve BP 140/90 or less.   Lipids Yes   Goal Cholesterol controlled with medications as prescribed, with individualized exercise RX and with personalized nutrition plan. Value goals: LDL < 730m HDL > 4063mParticipant states understanding of desired cholesterol values and following  prescriptions.   Intervention Provide nutrition & aerobic exercise along with  prescribed medications to achieve LDL <25m, HDL >488m      Personal Goals and Risk Factors Review:      Goals and Risk Factor Review      06/30/15 1534           Increase Aerobic Exercise and Physical Activity   Goals Progress/Improvement seen  No       Comments I called KeEilleen Kempfnd he wants to return to Cardiac Rehab has been busy at work. Hopes to return this Wed at 3:45pm.          Personal Goals Discharge:     Comments: 30 day review. Continue with ITP. Has been out since 6/6 Plans to return this week.

## 2015-07-05 ENCOUNTER — Other Ambulatory Visit: Payer: Self-pay | Admitting: *Deleted

## 2015-07-05 DIAGNOSIS — Z9861 Coronary angioplasty status: Secondary | ICD-10-CM

## 2015-07-11 ENCOUNTER — Other Ambulatory Visit: Payer: Self-pay | Admitting: Family Medicine

## 2015-07-24 ENCOUNTER — Encounter: Payer: Self-pay | Admitting: *Deleted

## 2015-07-24 DIAGNOSIS — Z9861 Coronary angioplasty status: Secondary | ICD-10-CM

## 2015-07-29 ENCOUNTER — Telehealth: Payer: Self-pay | Admitting: *Deleted

## 2015-07-29 NOTE — Telephone Encounter (Signed)
I called Steven Vang and he asked to be discharged from [facility] since he is working till [employer] every night and very busy wtih his work. He said he is feeling fine and when works quiets down-he hopes to go back to his gym.

## 2015-07-29 NOTE — Progress Notes (Signed)
Discharge Summary  Patient Details  Name: Steven Vang MRN: [id number] Date of Birth: [birth year] Referring Provider:  No ref. provider found   Number of Visits:   Reason for Discharge:  Early Exit:  Back to work  Smoking History:  History  Smoking status  . Not on file  Smokeless tobacco  . Not on file    Diagnosis:  S/P PTCA (percutaneous transluminal coronary angioplasty)  ADL [facility]SD:   Initial Exercise Prescription:   Discharge Exercise Prescription (Final Exercise Prescription Changes):     Exercise Prescription Changes - 07/24/15 0800    Exercise Review   Progression No  Absent since last review, only 1 visit recorded total      Functional Capacity:   Psychological, QOL, Others - Outcomes: PHQ 2/9: Depression screen PHQ 2/9 04/11/2015  Decreased Interest 3  Down, Depressed, Hopeless 2  PHQ - 2 Score 5  Altered sleeping 2  Tired, decreased energy 3  Change in appetite 2  Feeling bad or failure about yourself  2  Trouble concentrating 3  Moving slowly or fidgety/restless 1  Suicidal thoughts 0  PHQ-9 Score 18  Difficult doing work/chores Very difficult    Quality of Life:   Personal Goals: Goals established at orientation with interventions provided to work toward goal.    Personal Goals Discharge:   Nutrition & Weight - Outcomes:    Nutrition:   Nutrition Discharge:   Education Questionnaire Score:   Goals reviewed with patient; copy given to patient.

## 2015-07-29 NOTE — Telephone Encounter (Signed)
I called Steven Vang and he asked to be discharged from [facility] since he is working till [employer] every night and very busy wtih his work. He said he is feeling fine and when works quiets down-he hopes to go back to his gym.

## 2015-07-29 NOTE — Addendum Note (Signed)
Addended by: Gerlene Burdock on: 07/29/2015 02:32 PM   Modules accepted: Orders

## 2015-07-29 NOTE — Progress Notes (Signed)
Cardiac Individual Treatment Plan  Patient Details  Name: Steven Vang MRN: [id number] Date of Birth: 1955-12-23 Referring Provider:  No ref. provider found  Initial Encounter Date:    Visit Diagnosis: S/P PTCA (percutaneous transluminal coronary angioplasty)  Patient's Home Medications on Admission:  Current outpatient prescriptions:  .  furosemide (LASIX) 20 MG tablet, TAKE ONE TABLET EVERY DAY AS NEEDED FOR SWELLING, Disp: 30 tablet, Rfl: 3 .  QUEtiapine (SEROQUEL) 50 MG tablet, TAKE 1-2 TABLETS BY MOUTH AT BEDTIME, Disp: 45 tablet, Rfl: 3 .  VIAGRA 100 MG tablet, TAKE 1/2 TABLET (50MG) BY MOUTH 4 HOURS PRIOR TO INTERCOURSE MAX OF 1DAILY, Disp: 8 tablet, Rfl: 2  Past Medical History: No past medical history on file.  Tobacco Use: History  Smoking status  . Not on file  Smokeless tobacco  . Not on file    Labs: Recent Review Flowsheet Data    There is no flowsheet data to display.       Exercise Target Goals:    Exercise Program Goal: Individual exercise prescription set with THRR, safety & activity barriers. Participant demonstrates ability to understand and report RPE using BORG scale, to self-measure pulse accurately, and to acknowledge the importance of the exercise prescription.  Exercise Prescription Goal: Starting with aerobic activity 30 plus minutes a day, 3 days per week for initial exercise prescription. Provide home exercise prescription and guidelines that participant acknowledges understanding prior to discharge.  Activity Barriers & Risk Stratification:   6 Minute Walk:   Initial Exercise Prescription:   Exercise Prescription Changes:     Exercise Prescription Changes      06/06/15 0700 06/26/15 0700 07/24/15 0800       Exercise Review   Progression No  Only one visit recorded in last review period No  Absent since last review, only 1 visit recorded total No  Absent since last review, only 1 visit recorded total     Response to  Exercise   Blood Pressure (Admit) 124/72 mmHg       Blood Pressure (Exercise) 144/72 mmHg       Blood Pressure (Exit) 138/88 mmHg       Heart Rate (Admit) 69 bpm       Heart Rate (Exercise) 99 bpm       Heart Rate (Exit) 76 bpm       Rating of Perceived Exertion (Exercise) 11       Duration Progress to 30 minutes of continuous aerobic without signs/symptoms of physical distress       Intensity Rest + 30       Progression Continue progressive overload as per policy without signs/symptoms or physical distress.       Resistance Training   Training Prescription Yes       Weight 2       Reps 10-15       Interval Training   Interval Training No       Recumbant Elliptical   Level 2       Watts 25       Minutes 20          Discharge Exercise Prescription (Final Exercise Prescription Changes):     Exercise Prescription Changes - 07/24/15 0800    Exercise Review   Progression No  Absent since last review, only 1 visit recorded total      Nutrition:  Target Goals: Understanding of nutrition guidelines, daily intake of sodium <1556m, cholesterol <2036m calories 30% from fat and 7% or  less from saturated fats, daily to have 5 or more servings of fruits and vegetables.  Biometrics:    Nutrition Therapy Plan and Nutrition Goals:   Nutrition Discharge: Rate Your Plate Scores:   Nutrition Goals Re-Evaluation:     Nutrition Goals Re-Evaluation      06/30/15 1534 07/29/15 1427         Personal Goal #1 Re-Evaluation   Personal Goal #1 Unable to attend. Very busy at work.  Trying to eat healthy but unable to attend Cardiac REhab due to working so much.       Goal Progress Seen No          Psychosocial: Target Goals: Acknowledge presence or absence of depression, maximize coping skills, provide positive support system. Participant is able to verbalize types and ability to use techniques and skills needed for reducing stress and depression.  Initial Review & Psychosocial  Screening:   Quality of Life Scores:   PHQ-9:     Recent Review Flowsheet Data    Depression screen [facility] 2/9 04/11/2015   Decreased Interest 3   Down, Depressed, Hopeless 2   PHQ - 2 Vang 5   Altered sleeping 2   Tired, decreased energy 3   Change in appetite 2   Feeling bad or failure about yourself  2   Trouble concentrating 3   Moving slowly or fidgety/restless 1   Suicidal thoughts 0   PHQ-9 Vang 18   Difficult doing work/chores Very difficult      Psychosocial Evaluation and Intervention:     Psychosocial Evaluation - 05/22/15 1733    Psychosocial Evaluation & Interventions   Interventions Stress management education;Therapist referral;Relaxation education;Encouraged to exercise with the program and follow exercise prescription   Comments Counselor met with Steven Vang today for initial psychosocial evaluation.  He is a 59 year old gentleman who has had 3 stents inserted in the past year and reports having gained over 80 pounds in the process.  Steven Vang has a strong support system with a significant other, a daughter who lives close by and is actively involved in his faith community.  He admits he has struggled with depression and the medications prescribed are somewhat helpful, but he continues to report not sleeping well and under a great deal of stress.  Counselor recommended decreasing or eliminating caffeine from diet, exercising more consistently, attending the psychoeducational components of this program  (stress management and depression), contacting a counselor for individual therapy (information provided to client); and speaking with Dr. about a possible change in medication due to excessive weight gain over the past year.  Counselor will follow up with Steven Vang  as needed.   Continued Psychosocial Services Needed --  Attend and participate in the psychoeducational components of this program - stress management, depression and regularly practice the relaxation  techniques taught.  Also, meet with dietician to work on weight loss goals.        Psychosocial Re-Evaluation:   Vocational Rehabilitation: Provide vocational rehab assistance to qualifying candidates.   Vocational Rehab Evaluation & Intervention:   Education: Education Goals: Education classes will be provided on a weekly basis, covering required topics. Participant will state understanding/return demonstration of topics presented.  Learning Barriers/Preferences:   Education Topics: General Nutrition Guidelines/Fats and Fiber: -Group instruction provided by verbal, written material, models and posters to present the general guidelines for heart healthy nutrition. Gives an explanation and review of dietary fats and fiber.   Controlling Sodium/Reading Food Labels: -Group verbal  and written material supporting the discussion of sodium use in heart healthy nutrition. Review and explanation with models, verbal and written materials for utilization of the food label.   Exercise Physiology & Risk Factors: - Group verbal and written instruction with models to review the exercise physiology of the cardiovascular system and associated critical values. Details cardiovascular disease risk factors and the goals associated with each risk factor.   Aerobic Exercise & Resistance Training: - Gives group verbal and written discussion on the health impact of inactivity. On the components of aerobic and resistive training programs and the benefits of this training and how to safely progress through these programs.   Flexibility, Balance, General Exercise Guidelines: - Provides group verbal and written instruction on the benefits of flexibility and balance training programs. Provides general exercise guidelines with specific guidelines to those with heart or lung disease. Demonstration and skill practice provided.   Stress Management: - Provides group verbal and written instruction about the  health risks of elevated stress, cause of high stress, and healthy ways to reduce stress.   Depression: - Provides group verbal and written instruction on the correlation between heart/lung disease and depressed mood, treatment options, and the stigmas associated with seeking treatment.   Anatomy & Physiology of the Heart: - Group verbal and written instruction and models provide basic cardiac anatomy and physiology, with the coronary electrical and arterial systems. Review of: AMI, Angina, Valve disease, Heart Failure, Cardiac Arrhythmia, Pacemakers, and the ICD.          Cardiac Rehab from 05/22/2015 in [facility] Cardiac Rehab   Date  05/22/15   Educator  SB   Instruction Review Code  2- meets goals/outcomes      Cardiac Procedures: - Group verbal and written instruction and models to describe the testing methods done to diagnose heart disease. Reviews the outcomes of the test results. Describes the treatment choices: Medical Management, Angioplasty, or Coronary Bypass Surgery.   Cardiac Medications: - Group verbal and written instruction to review commonly prescribed medications for heart disease. Reviews the medication, class of the drug, and side effects. Includes the steps to properly store meds and maintain the prescription regimen.   Go Sex-Intimacy & Heart Disease, Get SMART - Goal Setting: - Group verbal and written instruction through game format to discuss heart disease and the return to sexual intimacy. Provides group verbal and written material to discuss and apply goal setting through the application of the S.M.A.R.T. Method.   Other Matters of the Heart: - Provides group verbal, written materials and models to describe Heart Failure, Angina, Valve Disease, and Diabetes in the realm of heart disease. Includes description of the disease process and treatment options available to the cardiac patient.   Exercise & Equipment Safety: - Individual verbal instruction and  demonstration of equipment use and safety with use of the equipment.   Infection Prevention: - Provides verbal and written material to individual with discussion of infection control including proper hand washing and proper equipment cleaning during exercise session.   Falls Prevention: - Provides verbal and written material to individual with discussion of falls prevention and safety.   Diabetes: - Individual verbal and written instruction to review signs/symptoms of diabetes, desired ranges of glucose level fasting, after meals and with exercise. Advice that pre and post exercise glucose checks will be done for 3 sessions at entry of program.    Knowledge Questionnaire Vang:   Personal Goals and Risk Factors at Admission:   Personal Goals and  Risk Factors Review:      Goals and Risk Factor Review      06/30/15 1534 07/29/15 1427         Increase Aerobic Exercise and Physical Activity   Goals Progress/Improvement seen  No Yes      Comments I called Steven Vang and he wants to return to Cardiac Rehab has been busy at work. Hopes to return this Wed at 3:45pm. some increase with his work but unable to return to Cardiac REhab gym due to work. When his work slows down he hopes to return to his gym. Steven Vang wanted to be discharged from Cardiac Rehab.          Personal Goals Discharge:     Comments: Unable to attend Cardiac Rehab since he is so busy at work. Wants to be discharged from Cardiac REhab and when his work slows down he hopes to return to his gym.

## 2015-07-29 NOTE — Patient Instructions (Addendum)
Discharge Instructions  Patient Details  Name: Steven Vang MRN: [id number] Date of Birth: 10/09/1956 Referring Provider:  No ref. provider found   Number of Visits:   Reason for Discharge:  Early Exit:  Back to work  Smoking History:  History  Smoking status  . Not on file  Smokeless tobacco  . Not on file    Diagnosis:  S/P PTCA (percutaneous transluminal coronary angioplasty)  Initial Exercise Prescription:   Discharge Exercise Prescription (Final Exercise Prescription Changes):     Exercise Prescription Changes - 07/24/15 0800    Exercise Review   Progression No  Absent since last review, only 1 visit recorded total      Functional Capacity:   Quality of Life:   Personal Goals: Goals established at orientation with interventions provided to work toward goal.    Personal Goals Discharge:   Nutrition & Weight - Outcomes:    Nutrition:   Nutrition Discharge:   Education Questionnaire Score:   Goals reviewed with patient; copy given to patient. Discharge Instructions  Patient Details  Name: Steven Vang MRN: [id number] Date of Birth: 1956-11-12 Referring Provider:  No ref. provider found   Number of Visits: 3  Reason for Discharge:  Early Exit:  Back to work  Smoking History:  History  Smoking status  . Not on file  Smokeless tobacco  . Not on file    Diagnosis:  S/P PTCA (percutaneous transluminal coronary angioplasty) - Plan: CARDIAC REHAB 30 DAY REVIEW  Initial Exercise Prescription:   Discharge Exercise Prescription (Final Exercise Prescription Changes):     Exercise Prescription Changes - 07/24/15 0800    Exercise Review   Progression No  Absent since last review, only 1 visit recorded total      Functional Capacity:   Quality of Life:   Personal Goals: Goals established at orientation with interventions provided to work toward goal.    Personal Goals Discharge:   Nutrition & Weight -  Outcomes:    Nutrition:   Nutrition Discharge:   Education Questionnaire Score:   Goals reviewed with patient; copy given to patient.

## 2015-08-04 NOTE — Addendum Note (Signed)
Addended by: Gerlene Burdock on: 08/04/2015 06:01 AM   Modules accepted: Orders

## 2015-08-04 NOTE — Progress Notes (Signed)
Discharge Summary  Patient Details  Name: Steven Vang MRN: [id number] Date of Birth: Feb 11, 1956 Referring Provider:  No ref. provider found   Number of Visits: 3  Reason for Discharge:  Early Exit:  Back to work  Smoking History:  History  Smoking status  . Not on file  Smokeless tobacco  . Not on file    Diagnosis:  S/P PTCA (percutaneous transluminal coronary angioplasty) - Plan: CARDIAC REHAB 30 DAY REVIEW  ADL [facility]:   Initial Exercise Prescription:   Discharge Exercise Prescription (Final Exercise Prescription Changes):     Exercise Prescription Changes - 07/24/15 0800    Exercise Review   Progression No  Absent since last review, only 1 visit recorded total      Functional Capacity:   Psychological, QOL, Others - Outcomes: PHQ 2/9: Depression screen PHQ 2/9 04/11/2015  Decreased Interest 3  Down, Depressed, Hopeless 2  PHQ - 2 Score 5  Altered sleeping 2  Tired, decreased energy 3  Change in appetite 2  Feeling bad or failure about yourself  2  Trouble concentrating 3  Moving slowly or fidgety/restless 1  Suicidal thoughts 0  PHQ-9 Score 18  Difficult doing work/chores Very difficult    Quality of Life:   Personal Goals: Goals established at orientation with interventions provided to work toward goal.    Personal Goals Discharge:   Nutrition & Weight - Outcomes:    Nutrition:   Nutrition Discharge:   Education Questionnaire Score:   Goals reviewed with patient; copy given to patient.

## 2016-01-05 DIAGNOSIS — F330 Major depressive disorder, recurrent, mild: Secondary | ICD-10-CM | POA: Insufficient documentation

## 2016-02-02 ENCOUNTER — Other Ambulatory Visit: Payer: Self-pay | Admitting: Family Medicine

## 2018-06-20 LAB — HEMOGLOBIN A1C: Hemoglobin A1C: 6.8

## 2020-01-14 ENCOUNTER — Ambulatory Visit: Payer: BLUE CROSS/BLUE SHIELD | Attending: Internal Medicine

## 2020-01-14 DIAGNOSIS — Z20822 Contact with and (suspected) exposure to covid-19: Secondary | ICD-10-CM

## 2020-01-15 LAB — NOVEL CORONAVIRUS, NAA: SARS-CoV-2, NAA: NOT DETECTED

## 2020-01-16 ENCOUNTER — Telehealth: Payer: Self-pay

## 2020-01-16 NOTE — Telephone Encounter (Signed)
Pt. Given COVID 19 results, verbalizes understanding. 

## 2020-03-03 ENCOUNTER — Other Ambulatory Visit: Payer: Self-pay

## 2020-03-03 ENCOUNTER — Ambulatory Visit (INDEPENDENT_AMBULATORY_CARE_PROVIDER_SITE_OTHER): Payer: 59 | Admitting: Nurse Practitioner

## 2020-03-03 ENCOUNTER — Encounter: Payer: Self-pay | Admitting: Nurse Practitioner

## 2020-03-03 VITALS — BP 134/80 | HR 74 | Temp 98.5°F | Ht 69.75 in | Wt 275.4 lb

## 2020-03-03 DIAGNOSIS — K219 Gastro-esophageal reflux disease without esophagitis: Secondary | ICD-10-CM

## 2020-03-03 DIAGNOSIS — E119 Type 2 diabetes mellitus without complications: Secondary | ICD-10-CM

## 2020-03-03 DIAGNOSIS — I25110 Atherosclerotic heart disease of native coronary artery with unstable angina pectoris: Secondary | ICD-10-CM

## 2020-03-03 DIAGNOSIS — Z7689 Persons encountering health services in other specified circumstances: Secondary | ICD-10-CM

## 2020-03-03 DIAGNOSIS — I10 Essential (primary) hypertension: Secondary | ICD-10-CM

## 2020-03-03 DIAGNOSIS — F330 Major depressive disorder, recurrent, mild: Secondary | ICD-10-CM

## 2020-03-03 DIAGNOSIS — E782 Mixed hyperlipidemia: Secondary | ICD-10-CM

## 2020-03-03 DIAGNOSIS — I5022 Chronic systolic (congestive) heart failure: Secondary | ICD-10-CM

## 2020-03-03 MED ORDER — LOSARTAN POTASSIUM 50 MG PO TABS: 50.0000 mg | ORAL_TABLET | Freq: Every day | ORAL | 0 refills | Status: DC

## 2020-03-03 MED ORDER — PRAVASTATIN SODIUM 40 MG PO TABS: 20.0000 mg | ORAL_TABLET | Freq: Every evening | ORAL | 0 refills | Status: DC

## 2020-03-03 MED ORDER — CLOPIDOGREL BISULFATE 75 MG PO TABS: 75.0000 mg | ORAL_TABLET | Freq: Every day | ORAL | 0 refills | Status: DC

## 2020-03-03 MED ORDER — METFORMIN HCL 1000 MG PO TABS: 1000.0000 mg | ORAL_TABLET | Freq: Two times a day (BID) | ORAL | 0 refills | Status: DC

## 2020-03-03 MED ORDER — TRAZODONE HCL 50 MG PO TABS: 50.0000 mg | ORAL_TABLET | Freq: Every day | ORAL | 0 refills | Status: DC

## 2020-03-03 MED ORDER — PANTOPRAZOLE SODIUM 40 MG PO TBEC: 40.0000 mg | DELAYED_RELEASE_TABLET | Freq: Every day | ORAL | 0 refills | Status: DC

## 2020-03-03 MED ORDER — GABAPENTIN 300 MG PO CAPS: 300.0000 mg | ORAL_CAPSULE | Freq: Every evening | ORAL | 0 refills | Status: DC | PRN

## 2020-03-03 MED ORDER — CITALOPRAM HYDROBROMIDE 40 MG PO TABS: 40.0000 mg | ORAL_TABLET | Freq: Every day | ORAL | 0 refills | Status: DC

## 2020-03-03 MED ORDER — FAMOTIDINE 20 MG PO TABS: 20.0000 mg | ORAL_TABLET | Freq: Two times a day (BID) | ORAL | 0 refills | Status: DC

## 2020-03-03 NOTE — Assessment & Plan Note (Addendum)
Chronic, stable.  Patient reports good control of symptoms on Lasix.  Requesting referral to Cardiologist to establish care for extensive cardiac history; referral placed.  Continue current regimen.

## 2020-03-03 NOTE — Assessment & Plan Note (Signed)
Chronic, uncontrolled.  Patient reports taking OTC without relief, will start on daily PPI and H2 antagonist and f/u on symptoms in 4 weeks.  Patient to call office or return to clinic with concerns.

## 2020-03-03 NOTE — Assessment & Plan Note (Signed)
Chronic, stable.  Will continue pravastatin for now.  Will check labs at f/u appointment in 4 weeks.

## 2020-03-03 NOTE — Assessment & Plan Note (Signed)
Chronic, stable.  Of note, did not take BP medication prior to appt.  Advised to continue current BP medication and monitor BP at home, will check labs at follow up appointment.  Follow up in 4 weeks on BP.

## 2020-03-03 NOTE — Progress Notes (Signed)
BP 134/80 (BP Location: Right Arm, Cuff Size: Normal)   Pulse 74   Temp 98.5 F (36.9 C) (Oral)   Ht 5' 9.75" (1.772 m)   Wt 275 lb 6.4 oz (124.9 kg)   SpO2 99%   BMI 39.80 kg/m    Subjective:    Patient ID: Steven Vang, male    DOB: 06-05-1956, 64 y.o.   MRN: [id number]  HPI: Steven Vang is a 64 y.o. male presents for new patient visit to establish care.  Introduced to Designer, jewellery role and practice setting.  All questions answered.  Chief Complaint  Patient presents with  . Establish Care  . Abdominal Pain    Patient states he has stomach pain/burning after eating.   . Gastroesophageal Reflux  . Shortness of Breath    Patient states he gets SOB easily and has coughing spells.  . Cough   GERD Onset: about 2 months ago He states everything he eats makes his stomach hurt including water. GERD control status: uncontrolled  Satisfied with current treatment? no Heartburn frequency: daily Medication side effects: no  Medication compliance: worse Previous GERD medications: gave something but does not remember Antacid use frequency:  Daily, no relief Duration: constant Nature: burning in epigastric area Location: epigastric Heartburn duration: constant Alleviatiating factors:  Comes and goes on own Aggravating factors: eating/drinking anything Dysphagia: no Odynophagia:  no  Nausea/Vomiting: no Hematemesis: no Blood in stool: no EGD: no  Recurrent NSAID use:  Has taken PPI without success.  SHORTNESS OF BREATH Duration: about 1 year Onset: sudden Description of breathing discomfort: when doing physical activity Severity: moderate Episode duration: goes away if he sits down once heart rate goes down Frequency: daily Related to exertion: yes Cough: no Chest tightness: yes Wheezing: yes Fevers: no Chest pain: no Palpitations: no  Nausea: no Diaphoresis: yes; thinks is related to sugar being low Deconditioning: yes Status: worse Aggravating  factors: activity Alleviating factors: sitting down Treatments attempted: none tried  No Known Allergies  Outpatient Encounter Medications as of 03/03/2020  Medication Sig  . aspirin 81 MG EC tablet Take 1 tablet by mouth daily.  . citalopram (CELEXA) 40 MG tablet Take 1 tablet (40 mg total) by mouth daily.  . clopidogrel (PLAVIX) 75 MG tablet Take 1 tablet (75 mg total) by mouth daily.  . furosemide (LASIX) 40 MG tablet Take 40 mg by mouth daily.  Marland Kitchen gabapentin (NEURONTIN) 300 MG capsule Take 1-2 capsules (300-600 mg total) by mouth at bedtime as needed.  Marland Kitchen losartan (COZAAR) 50 MG tablet Take 1 tablet (50 mg total) by mouth daily.  . metFORMIN (GLUCOPHAGE) 1000 MG tablet Take 1 tablet (1,000 mg total) by mouth 2 (two) times daily with a meal.  . pravastatin (PRAVACHOL) 40 MG tablet Take 0.5 tablets (20 mg total) by mouth every evening.  . traZODone (DESYREL) 50 MG tablet Take 1 tablet (50 mg total) by mouth at bedtime. PRN  . [DISCONTINUED] citalopram (CELEXA) 40 MG tablet Take 1 tablet by mouth daily.  . [DISCONTINUED] clopidogrel (PLAVIX) 75 MG tablet Take 75 mg by mouth daily.  . [DISCONTINUED] gabapentin (NEURONTIN) 300 MG capsule Take 1-2 capsules by mouth at bedtime as needed.  . [DISCONTINUED] losartan (COZAAR) 50 MG tablet Take 50 mg by mouth daily.  . [DISCONTINUED] metFORMIN (GLUCOPHAGE) 1000 MG tablet Take 1 tablet by mouth 2 (two) times daily with a meal.  . [DISCONTINUED] pravastatin (PRAVACHOL) 40 MG tablet Take 0.5 tablets by mouth every evening.  . [  DISCONTINUED] QUEtiapine (SEROQUEL) 50 MG tablet TAKE 1-2 TABLETS BY MOUTH AT BEDTIME  . [DISCONTINUED] traZODone (DESYREL) 50 MG tablet Take 1 tablet by mouth at bedtime. PRN  . famotidine (PEPCID) 20 MG tablet Take 1 tablet (20 mg total) by mouth 2 (two) times daily.  . pantoprazole (PROTONIX) 40 MG tablet Take 1 tablet (40 mg total) by mouth daily.  . [DISCONTINUED] furosemide (LASIX) 20 MG tablet TAKE ONE TABLET EVERY DAY AS  NEEDED FOR SWELLING  . [DISCONTINUED] VIAGRA 100 MG tablet TAKE 1/2 TABLET (50MG ) BY MOUTH 4 HOURS PRIOR TO INTERCOURSE MAX OF 1DAILY (Patient not taking: Reported on 03/03/2020)   No facility-administered encounter medications on file as of 03/03/2020.   Patient Active Problem List   Diagnosis Date Noted  . Mild episode of recurrent major depressive disorder ([place]) 01/05/2016  . Chronic systolic heart failure ([place]) 01/13/2015  . Coronary artery disease 01/13/2015  . Essential hypertension 01/13/2015  . Left ventricular dysfunction 01/13/2015  . Type 2 diabetes mellitus without complication, without long-term current use of insulin ([place]) 01/13/2015  . Anxiety disorder 12/20/2014  . GERD (gastroesophageal reflux disease) 12/20/2014  . Mixed hyperlipidemia 12/20/2014  . Non morbid obesity due to excess calories 12/20/2014   Past Medical History:  Diagnosis Date  . Abnormal EKG   . Anxiety disorder   . CAD (coronary artery disease)   . CHF (congestive heart failure) ([place])   . Chronic systolic heart failure ([place])   . Depression   . Diabetes mellitus ([place])   . GERD (gastroesophageal reflux disease)   . Hypertension   . Mixed hyperlipidemia   . Non morbid obesity due to excess calories   . Type 2 diabetes mellitus without complication, without long-term current use of insulin ([place])     Review of Systems  Constitutional: Negative.  Negative for activity change, appetite change, fatigue and fever.  HENT: Negative for trouble swallowing and voice change.   Respiratory: Positive for cough, chest tightness and shortness of breath (exertional). Negative for wheezing.   Cardiovascular: Negative.  Negative for chest pain, palpitations and leg swelling.  Gastrointestinal: Positive for abdominal pain. Negative for abdominal distention, blood in stool, constipation, diarrhea, nausea and vomiting.  Endocrine: Negative.   Genitourinary: Negative.   Musculoskeletal: Positive for arthralgias.  Negative for gait problem, myalgias and neck pain.  Skin: Negative.  Negative for color change and pallor.  Neurological: Negative.  Negative for dizziness, light-headedness and headaches.  Psychiatric/Behavioral: Negative.  Negative for confusion. The patient is not nervous/anxious.    Per HPI unless specifically indicated above     Objective:    BP 134/80 (BP Location: Right Arm, Cuff Size: Normal)   Pulse 74   Temp 98.5 F (36.9 C) (Oral)   Ht 5' 9.75" (1.772 m)   Wt 275 lb 6.4 oz (124.9 kg)   SpO2 99%   BMI 39.80 kg/m   Wt Readings from Last 3 Encounters:  03/03/20 275 lb 6.4 oz (124.9 kg)  04/11/15 (!) 300 lb 14.4 oz (136.5 kg)    Physical Exam Vitals and nursing note reviewed.  Constitutional:      General: He is not in acute distress.    Appearance: He is obese. He is not toxic-appearing.  Eyes:     Extraocular Movements: Extraocular movements intact.  Cardiovascular:     Rate and Rhythm: Normal rate and regular rhythm.     Heart sounds: Normal heart sounds.  Pulmonary:     Effort: Pulmonary effort is normal.  No respiratory distress.     Breath sounds: Normal breath sounds. No wheezing or rhonchi.  Abdominal:     General: Bowel sounds are normal. There is no distension.     Palpations: Abdomen is soft.  Musculoskeletal:        General: No swelling. Normal range of motion.     Right lower leg: No edema.     Left lower leg: No edema.  Skin:    General: Skin is warm and dry.     Coloration: Skin is not cyanotic or jaundiced.  Neurological:     General: No focal deficit present.     Mental Status: He is alert and oriented to person, place, and time.  Psychiatric:        Mood and Affect: Mood normal. Mood is not anxious or depressed.        Behavior: Behavior normal.       Assessment & Plan:   Problem List Items Addressed This Visit      Cardiovascular and Mediastinum   Chronic systolic heart failure (HCC)    Chronic, stable.  Patient reports good  control of symptoms on Lasix.  Requesting referral to Cardiologist to establish care for extensive cardiac history; referral placed.  Continue current regimen.      Relevant Medications   aspirin 81 MG EC tablet   furosemide (LASIX) 40 MG tablet   pravastatin (PRAVACHOL) 40 MG tablet   losartan (COZAAR) 50 MG tablet   Coronary artery disease    Chronic, stable.  Denies chest pain or other cardiac symptoms.  Referral to Cardiology to establish care placed.      Relevant Medications   aspirin 81 MG EC tablet   furosemide (LASIX) 40 MG tablet   pravastatin (PRAVACHOL) 40 MG tablet   losartan (COZAAR) 50 MG tablet   clopidogrel (PLAVIX) 75 MG tablet   Essential hypertension    Chronic, stable.  Of note, did not take BP medication prior to appt.  Advised to continue current BP medication and monitor BP at home, will check labs at follow up appointment.  Follow up in 4 weeks on BP.      Relevant Medications   aspirin 81 MG EC tablet   furosemide (LASIX) 40 MG tablet   pravastatin (PRAVACHOL) 40 MG tablet   losartan (COZAAR) 50 MG tablet     Digestive   GERD (gastroesophageal reflux disease)    Chronic, uncontrolled.  Patient reports taking OTC without relief, will start on daily PPI and H2 antagonist and f/u on symptoms in 4 weeks.  Patient to call office or return to clinic with concerns.      Relevant Medications   pantoprazole (PROTONIX) 40 MG tablet   famotidine (PEPCID) 20 MG tablet     Endocrine   Type 2 diabetes mellitus without complication, without long-term current use of insulin (HCC) - Primary    Chronic, stable.  Will continue metformin for now and check A1c at follow up appointment in 4 weeks.      Relevant Medications   aspirin 81 MG EC tablet   pravastatin (PRAVACHOL) 40 MG tablet   metFORMIN (GLUCOPHAGE) 1000 MG tablet   gabapentin (NEURONTIN) 300 MG capsule   losartan (COZAAR) 50 MG tablet     Other   Mild episode of recurrent major depressive disorder  (HCC)    Chronic, stable on celexa 40mg .  Will continue current medication.  May consider SNRI in future to help with mood and chronic pain issues.  Patient to call with concerns.      Relevant Medications   citalopram (CELEXA) 40 MG tablet   traZODone (DESYREL) 50 MG tablet   Mixed hyperlipidemia    Chronic, stable.  Will continue pravastatin for now.  Will check labs at f/u appointment in 4 weeks.        Relevant Medications   aspirin 81 MG EC tablet   furosemide (LASIX) 40 MG tablet   pravastatin (PRAVACHOL) 40 MG tablet   losartan (COZAAR) 50 MG tablet    Other Visit Diagnoses    Encounter to establish care           Follow up plan: Return in about 4 weeks (around 03/31/2020) for DM/HTN/HLD f/u.

## 2020-03-03 NOTE — Assessment & Plan Note (Addendum)
Chronic, stable on celexa 40mg .  Will continue current medication.  May consider SNRI in future to help with mood and chronic pain issues.  Patient to call with concerns.

## 2020-03-03 NOTE — Assessment & Plan Note (Signed)
Chronic, stable.  Will continue metformin for now and check A1c at follow up appointment in 4 weeks.

## 2020-03-03 NOTE — Assessment & Plan Note (Signed)
Chronic, stable.  Denies chest pain or other cardiac symptoms.  Referral to Cardiology to establish care placed.

## 2020-03-27 ENCOUNTER — Other Ambulatory Visit: Payer: Self-pay

## 2020-03-27 DIAGNOSIS — K219 Gastro-esophageal reflux disease without esophagitis: Secondary | ICD-10-CM

## 2020-03-27 MED ORDER — PANTOPRAZOLE SODIUM 40 MG PO TBEC: 40.0000 mg | DELAYED_RELEASE_TABLET | Freq: Every day | ORAL | 0 refills | Status: DC

## 2020-03-27 MED ORDER — FAMOTIDINE 20 MG PO TABS: 20.0000 mg | ORAL_TABLET | Freq: Two times a day (BID) | ORAL | 0 refills | Status: DC

## 2020-03-27 NOTE — Telephone Encounter (Signed)
LOV: 03/03/2020; NOV: 03/31/2020 with Carnella Guadalajara, NP

## 2020-03-30 ENCOUNTER — Other Ambulatory Visit: Payer: Self-pay | Admitting: Nurse Practitioner

## 2020-03-31 ENCOUNTER — Ambulatory Visit (INDEPENDENT_AMBULATORY_CARE_PROVIDER_SITE_OTHER): Payer: 59 | Admitting: Nurse Practitioner

## 2020-03-31 ENCOUNTER — Encounter: Payer: Self-pay | Admitting: Nurse Practitioner

## 2020-03-31 ENCOUNTER — Other Ambulatory Visit: Payer: Self-pay

## 2020-03-31 VITALS — BP 119/76 | HR 65 | Temp 97.5°F | Wt 268.8 lb

## 2020-03-31 DIAGNOSIS — K219 Gastro-esophageal reflux disease without esophagitis: Secondary | ICD-10-CM

## 2020-03-31 DIAGNOSIS — E782 Mixed hyperlipidemia: Secondary | ICD-10-CM | POA: Diagnosis not present

## 2020-03-31 DIAGNOSIS — D170 Benign lipomatous neoplasm of skin and subcutaneous tissue of head, face and neck: Secondary | ICD-10-CM

## 2020-03-31 DIAGNOSIS — Z1329 Encounter for screening for other suspected endocrine disorder: Secondary | ICD-10-CM

## 2020-03-31 DIAGNOSIS — E119 Type 2 diabetes mellitus without complications: Secondary | ICD-10-CM

## 2020-03-31 DIAGNOSIS — I10 Essential (primary) hypertension: Secondary | ICD-10-CM

## 2020-03-31 DIAGNOSIS — Z1211 Encounter for screening for malignant neoplasm of colon: Secondary | ICD-10-CM

## 2020-03-31 HISTORY — DX: Benign lipomatous neoplasm of skin and subcutaneous tissue of head, face and neck: D17.0

## 2020-03-31 LAB — BAYER DCA HB A1C WAIVED: HB A1C (BAYER DCA - WAIVED): 6.9 % (ref <7.0)

## 2020-03-31 MED ORDER — FAMOTIDINE 20 MG PO TABS: 20.0000 mg | ORAL_TABLET | Freq: Two times a day (BID) | ORAL | 0 refills | Status: DC

## 2020-03-31 MED ORDER — PANTOPRAZOLE SODIUM 40 MG PO TBEC: 40.0000 mg | DELAYED_RELEASE_TABLET | Freq: Every day | ORAL | 0 refills | Status: DC

## 2020-03-31 NOTE — Assessment & Plan Note (Signed)
Chronic, enlarging per patient.  Previously removed via Dermatologist, patient requesting referral to have complete removal of entire lipoma.  Educated that lipoma may recur even if completely removed, as it has done.  Patient desiring further consultation and referral to have complete removal.  Will generate referral for general surgery today.

## 2020-03-31 NOTE — Assessment & Plan Note (Signed)
Chronic, stable.  A1C, CBC, and CMP checked today, will follow.  Continue metformin 1,000 mg bid for now.

## 2020-03-31 NOTE — Assessment & Plan Note (Signed)
Chronic, stable.  Lipids checked today, will follow.  Will continue pravastatin 40 mg.  CMP checked today.

## 2020-03-31 NOTE — Progress Notes (Signed)
BP 119/76   Pulse 65   Temp (!) 97.5 F (36.4 C) (Oral)   Wt 268 lb 12.8 oz (121.9 kg)   SpO2 96%   BMI 38.85 kg/m    Subjective:    Patient ID: Steven Vang, male    DOB: 1956/07/31, 64 y.o.   MRN: [id number]  HPI: Steven Vang is a 64 y.o. male presenting for follow up.  Chief Complaint  Patient presents with  . Diabetes  . Hyperlipidemia  . Hypertension   DIABETES Hypoglycemic episodes:no Polydipsia/polyuria: no Visual disturbance: no; vision check recently done Chest pain: no Paresthesias: yes; chronic  Glucose Monitoring: yes  Accucheck frequency: once every 3-4 weeks  Fasting glucose: 100-120 Blood Pressure Monitoring: rarely Retinal Examination: Up to Date  Foot Exam: patient declined today Diabetic Education: Completed Pneumovax: Up to Date Influenza: Up to Date Aspirin: yes  HYPERTENSION / HYPERLIPIDEMIA Satisfied with current treatment? yes Duration of hypertension: chronic BP monitoring frequency: rarely BP range: 120s/70-80s BP medication side effects: no Past BP meds: losartan Duration of hyperlipidemia: chronic Cholesterol medication side effects: no Cholesterol supplements: none Past cholesterol medications: pravastatin every other day Medication compliance: fair compliance Aspirin: yes Recent stressors: no Recurrent headaches: no Visual changes: no Palpitations: no Dyspnea: no Chest pain: no Lower extremity edema: no Dizzy/lightheaded: no   GERD GERD control status: better  Satisfied with current treatment? yes Heartburn frequency: weekly Medication side effects: no  Medication compliance: excellent compliance Previous GERD medications: pepcid 20 mg twice daily and protonix 40 mg daily Antacid use frequency:  never Heartburn duration: never Dysphagia: no Odynophagia:  no Hematemesis: no Blood in stool: no EGD: no   SKIN LUMP Patient states he has a spot on the back of his head that he previously had removed by a  dermatologist years ago.  He reports that the lump is getting bigger but is not painful, red, or oozing any liquid.  The lump is becoming more bothersome and he is requesting a referral to have it completely removed.  Duration: years Location: back of head Painful: no Itching: no Onset: gradual Context: bigger Associated signs and symptoms: none History of skin cancer: no History of precancerous skin lesions: no Family history of skin cancer: no   Patient reports that he is overdue for his colonoscopy and would like a referral for that.  No Known Allergies  Outpatient Encounter Medications as of 03/31/2020  Medication Sig  . aspirin 81 MG EC tablet Take 1 tablet by mouth daily.  . citalopram (CELEXA) 40 MG tablet Take 1 tablet (40 mg total) by mouth daily.  . clopidogrel (PLAVIX) 75 MG tablet Take 1 tablet (75 mg total) by mouth daily.  . famotidine (PEPCID) 20 MG tablet Take 1 tablet (20 mg total) by mouth 2 (two) times daily.  . furosemide (LASIX) 40 MG tablet Take 40 mg by mouth daily.  Marland Kitchen gabapentin (NEURONTIN) 300 MG capsule Take 1-2 capsules (300-600 mg total) by mouth at bedtime as needed.  Marland Kitchen losartan (COZAAR) 50 MG tablet Take 1 tablet (50 mg total) by mouth daily.  . metFORMIN (GLUCOPHAGE) 1000 MG tablet Take 1 tablet (1,000 mg total) by mouth 2 (two) times daily with a meal.  . pantoprazole (PROTONIX) 40 MG tablet Take 1 tablet (40 mg total) by mouth daily.  . pravastatin (PRAVACHOL) 40 MG tablet Take 0.5 tablets (20 mg total) by mouth every evening.  . traZODone (DESYREL) 50 MG tablet Take 1 tablet (50 mg total) by mouth  at bedtime. PRN  . [DISCONTINUED] famotidine (PEPCID) 20 MG tablet Take 1 tablet (20 mg total) by mouth 2 (two) times daily.  . [DISCONTINUED] pantoprazole (PROTONIX) 40 MG tablet Take 1 tablet (40 mg total) by mouth daily.   No facility-administered encounter medications on file as of 03/31/2020.   Patient Active Problem List   Diagnosis Date Noted  .  Lipoma of head 03/31/2020  . Mild episode of recurrent major depressive disorder ([place]) 01/05/2016  . Chronic systolic heart failure ([place]) 01/13/2015  . Coronary artery disease 01/13/2015  . Essential hypertension 01/13/2015  . Left ventricular dysfunction 01/13/2015  . Type 2 diabetes mellitus without complication, without long-term current use of insulin ([place]) 01/13/2015  . Anxiety disorder 12/20/2014  . GERD (gastroesophageal reflux disease) 12/20/2014  . Mixed hyperlipidemia 12/20/2014  . Non morbid obesity due to excess calories 12/20/2014   Past Medical History:  Diagnosis Date  . Abnormal EKG   . Anxiety disorder   . CAD (coronary artery disease)   . CHF (congestive heart failure) ([place])   . Chronic systolic heart failure ([place])   . Depression   . Diabetes mellitus ([place])   . GERD (gastroesophageal reflux disease)   . Hypertension   . Mixed hyperlipidemia   . Non morbid obesity due to excess calories   . Type 2 diabetes mellitus without complication, without long-term current use of insulin (HCC)    Relevant past medical, surgical, family and social history reviewed and updated as indicated. Interim medical history since our last visit reviewed.  Review of Systems  Constitutional: Negative.  Negative for activity change, chills, diaphoresis and fever.  Eyes: Negative.  Negative for visual disturbance.  Respiratory: Negative.  Negative for cough, shortness of breath and wheezing.   Cardiovascular: Negative for chest pain, palpitations and leg swelling.  Skin: Negative.  Negative for color change and pallor.  Neurological: Positive for numbness (chronic per patient). Negative for dizziness, weakness, light-headedness and headaches.  Hematological: Negative.  Negative for adenopathy.  Psychiatric/Behavioral: Negative.  Negative for confusion, decreased concentration and sleep disturbance. The patient is not nervous/anxious.    Per HPI unless specifically indicated above      Objective:    BP 119/76   Pulse 65   Temp (!) 97.5 F (36.4 C) (Oral)   Wt 268 lb 12.8 oz (121.9 kg)   SpO2 96%   BMI 38.85 kg/m   Wt Readings from Last 3 Encounters:  03/31/20 268 lb 12.8 oz (121.9 kg)  03/03/20 275 lb 6.4 oz (124.9 kg)  04/11/15 (!) 300 lb 14.4 oz (136.5 kg)    Physical Exam Vitals and nursing note reviewed.  Constitutional:      General: He is not in acute distress.    Appearance: Normal appearance. He is not toxic-appearing.  Eyes:     General: No scleral icterus.    Extraocular Movements: Extraocular movements intact.  Cardiovascular:     Rate and Rhythm: Normal rate and regular rhythm.     Heart sounds: Normal heart sounds.  Pulmonary:     Effort: Pulmonary effort is normal. No respiratory distress.     Breath sounds: Normal breath sounds. No wheezing.  Abdominal:     General: Abdomen is flat. Bowel sounds are normal. There is no distension.     Palpations: Abdomen is soft.     Tenderness: There is no abdominal tenderness.  Musculoskeletal:        General: No swelling. Normal range of motion.  Cervical back: Normal range of motion. No rigidity.     Right lower leg: No edema.     Left lower leg: No edema.  Skin:    General: Skin is warm and dry.     Coloration: Skin is not jaundiced or pale.  Neurological:     General: No focal deficit present.     Mental Status: He is alert and oriented to person, place, and time.     Motor: No weakness.     Gait: Gait normal.  Psychiatric:        Mood and Affect: Mood normal.        Behavior: Behavior normal.        Thought Content: Thought content normal.        Judgment: Judgment normal.       Assessment & Plan:   Problem List Items Addressed This Visit      Cardiovascular and Mediastinum   Essential hypertension    Chronic, stable on losartan 50 mg daily.  CBC and CMP checked today.  Patient to continue checking BP at home and notify clinic if persists >140/90.  With any chest pain, or  shortness of breath, go to ER.      Relevant Orders   CBC with Differential/Platelet   Comprehensive metabolic panel     Digestive   GERD (gastroesophageal reflux disease)    Chronic, improved with daily PPI and H2 antagonist.  Continue medication for now and follow up in 3 months.      Relevant Medications   pantoprazole (PROTONIX) 40 MG tablet   famotidine (PEPCID) 20 MG tablet   Other Relevant Orders   CBC with Differential/Platelet   Magnesium     Endocrine   Type 2 diabetes mellitus without complication, without long-term current use of insulin (HCC) - Primary    Chronic, stable.  A1C, CBC, and CMP checked today, will follow.  Continue metformin 1,000 mg bid for now.      Relevant Orders   Bayer DCA Hb A1c Waived (Completed)     Other   Mixed hyperlipidemia    Chronic, stable.  Lipids checked today, will follow.  Will continue pravastatin 40 mg.  CMP checked today.      Relevant Orders   Comprehensive metabolic panel   Lipid Panel w/o Chol/HDL Ratio   Lipoma of head    Chronic, enlarging per patient.  Previously removed via Dermatologist, patient requesting referral to have complete removal of entire lipoma.  Educated that lipoma may recur even if completely removed, as it has done.  Patient desiring further consultation and referral to have complete removal.  Will generate referral for general surgery today.      Relevant Orders   Ambulatory referral to General Surgery    Other Visit Diagnoses    Screening for thyroid disorder       TSH checked today, will follow.   Relevant Orders   TSH   Screening for colon cancer       Overdue for colonoscopy, referral generated today.   Relevant Orders   Ambulatory referral to Gastroenterology       Follow up plan: Return in about 3 months (around 06/30/2020) for DM, HTN/HLD f/u.

## 2020-03-31 NOTE — Patient Instructions (Signed)
Nice seeing you today, Steven Vang!  We will call you with your lab results on Monday.  Keep up the great work and take care. Steven Vang  Carbohydrate Counting for Diabetes Mellitus, Adult  Carbohydrate counting is a method of keeping track of how many carbohydrates you eat. Eating carbohydrates naturally increases the amount of sugar (glucose) in the blood. Counting how many carbohydrates you eat helps keep your blood glucose within normal limits, which helps you manage your diabetes (diabetes mellitus). It is important to know how many carbohydrates you can safely have in each meal. This is different for every person. A diet and nutrition specialist (registered dietitian) can help you make a meal plan and calculate how many carbohydrates you should have at each meal and snack. Carbohydrates are found in the following foods:  Grains, such as breads and cereals.  Dried beans and soy products.  Starchy vegetables, such as potatoes, peas, and corn.  Fruit and fruit juices.  Milk and yogurt.  Sweets and snack foods, such as cake, cookies, candy, chips, and soft drinks. How do I count carbohydrates? There are two ways to count carbohydrates in food. You can use either of the methods or a combination of both. Reading "Nutrition Facts" on packaged food The "Nutrition Facts" list is included on the labels of almost all packaged foods and beverages in the [place] It includes:  The serving size.  Information about nutrients in each serving, including the grams (g) of carbohydrate per serving. To use the "Nutrition Facts":  Decide how many servings you will have.  Multiply the number of servings by the number of carbohydrates per serving.  The resulting number is the total amount of carbohydrates that you will be having. Learning standard serving sizes of other foods When you eat carbohydrate foods that are not packaged or do not include "Nutrition Facts" on the label, you need to measure the  servings in order to count the amount of carbohydrates:  Measure the foods that you will eat with a food scale or measuring cup, if needed.  Decide how many standard-size servings you will eat.  Multiply the number of servings by 15. Most carbohydrate-rich foods have about 15 g of carbohydrates per serving. ? For example, if you eat 8 oz (170 g) of strawberries, you will have eaten 2 servings and 30 g of carbohydrates (2 servings x 15 g = 30 g).  For foods that have more than one food mixed, such as soups and casseroles, you must count the carbohydrates in each food that is included. The following list contains standard serving sizes of common carbohydrate-rich foods. Each of these servings has about 15 g of carbohydrates:   hamburger bun or  English muffin.   oz (15 mL) syrup.   oz (14 g) jelly.  1 slice of bread.  1 six-inch tortilla.  3 oz (85 g) cooked rice or pasta.  4 oz (113 g) cooked dried beans.  4 oz (113 g) starchy vegetable, such as peas, corn, or potatoes.  4 oz (113 g) hot cereal.  4 oz (113 g) mashed potatoes or  of a large baked potato.  4 oz (113 g) canned or frozen fruit.  4 oz (120 mL) fruit juice.  4-6 crackers.  6 chicken nuggets.  6 oz (170 g) unsweetened dry cereal.  6 oz (170 g) plain fat-free yogurt or yogurt sweetened with artificial sweeteners.  8 oz (240 mL) milk.  8 oz (170 g) fresh fruit or one  small piece of fruit.  24 oz (680 g) popped popcorn. Example of carbohydrate counting Sample meal  3 oz (85 g) chicken breast.  6 oz (170 g) brown rice.  4 oz (113 g) corn.  8 oz (240 mL) milk.  8 oz (170 g) strawberries with sugar-free whipped topping. Carbohydrate calculation 1. Identify the foods that contain carbohydrates: ? Rice. ? Corn. ? Milk. ? Strawberries. 2. Calculate how many servings you have of each food: ? 2 servings rice. ? 1 serving corn. ? 1 serving milk. ? 1 serving strawberries. 3. Multiply each  number of servings by 15 g: ? 2 servings rice x 15 g = 30 g. ? 1 serving corn x 15 g = 15 g. ? 1 serving milk x 15 g = 15 g. ? 1 serving strawberries x 15 g = 15 g. 4. Add together all of the amounts to find the total grams of carbohydrates eaten: ? 30 g + 15 g + 15 g + 15 g = 75 g of carbohydrates total. Summary  Carbohydrate counting is a method of keeping track of how many carbohydrates you eat.  Eating carbohydrates naturally increases the amount of sugar (glucose) in the blood.  Counting how many carbohydrates you eat helps keep your blood glucose within normal limits, which helps you manage your diabetes.  A diet and nutrition specialist (registered dietitian) can help you make a meal plan and calculate how many carbohydrates you should have at each meal and snack. This information is not intended to replace advice given to you by your health care provider. Make sure you discuss any questions you have with your health care provider. Document Revised: 06/26/2017 Document Reviewed: 05/15/2016 Elsevier Patient Education  [place].

## 2020-03-31 NOTE — Assessment & Plan Note (Signed)
Chronic, improved with daily PPI and H2 antagonist.  Continue medication for now and follow up in 3 months.

## 2020-03-31 NOTE — Assessment & Plan Note (Signed)
Chronic, stable on losartan 50 mg daily.  CBC and CMP checked today.  Patient to continue checking BP at home and notify clinic if persists >140/90.  With any chest pain, or shortness of breath, go to ER.

## 2020-04-01 LAB — CBC WITH DIFFERENTIAL/PLATELET
Basophils Absolute: 0.1 10*3/uL (ref 0.0–0.2)
Basos: 1 %
EOS (ABSOLUTE): 0.3 10*3/uL (ref 0.0–0.4)
Eos: 6 %
Hematocrit: 41.3 % (ref 37.5–51.0)
Hemoglobin: 13.7 g/dL (ref 13.0–17.7)
Immature Grans (Abs): 0 10*3/uL (ref 0.0–0.1)
Immature Granulocytes: 0 %
Lymphocytes Absolute: 1.7 10*3/uL (ref 0.7–3.1)
Lymphs: 32 %
MCH: 26.8 pg (ref 26.6–33.0)
MCHC: 33.2 g/dL (ref 31.5–35.7)
MCV: 81 fL (ref 79–97)
Monocytes Absolute: 0.4 10*3/uL (ref 0.1–0.9)
Monocytes: 7 %
Neutrophils Absolute: 2.8 10*3/uL (ref 1.4–7.0)
Neutrophils: 54 %
Platelets: 188 10*3/uL (ref 150–450)
RBC: 5.12 x10E6/uL (ref 4.14–5.80)
RDW: 13.9 % (ref 11.6–15.4)
WBC: 5.2 10*3/uL (ref 3.4–10.8)

## 2020-04-01 LAB — COMPREHENSIVE METABOLIC PANEL
ALT: 16 IU/L (ref 0–44)
AST: 25 IU/L (ref 0–40)
Albumin/Globulin Ratio: 1.8 (ref 1.2–2.2)
Albumin: 4.4 g/dL (ref 3.8–4.8)
Alkaline Phosphatase: 69 IU/L (ref 39–117)
BUN/Creatinine Ratio: 8 — ABNORMAL LOW (ref 10–24)
BUN: 11 mg/dL (ref 8–27)
Bilirubin Total: 0.3 mg/dL (ref 0.0–1.2)
CO2: 21 mmol/L (ref 20–29)
Calcium: 9.4 mg/dL (ref 8.6–10.2)
Chloride: 106 mmol/L (ref 96–106)
Creatinine, Ser: 1.3 mg/dL — ABNORMAL HIGH (ref 0.76–1.27)
GFR calc Af Amer: 68 mL/min/{1.73_m2} (ref >59)
GFR calc non Af Amer: 58 mL/min/{1.73_m2} — ABNORMAL LOW (ref >59)
Globulin, Total: 2.4 g/dL (ref 1.5–4.5)
Glucose: 142 mg/dL — ABNORMAL HIGH (ref 65–99)
Potassium: 4.6 mmol/L (ref 3.5–5.2)
Sodium: 141 mmol/L (ref 134–144)
Total Protein: 6.8 g/dL (ref 6.0–8.5)

## 2020-04-01 LAB — LIPID PANEL W/O CHOL/HDL RATIO
Cholesterol, Total: 173 mg/dL (ref 100–199)
HDL: 36 mg/dL — ABNORMAL LOW (ref >39)
LDL Chol Calc (NIH): 106 mg/dL — ABNORMAL HIGH (ref 0–99)
Triglycerides: 176 mg/dL — ABNORMAL HIGH (ref 0–149)
VLDL Cholesterol Cal: 31 mg/dL (ref 5–40)

## 2020-04-01 LAB — TSH: TSH: 2.5 u[IU]/mL (ref 0.450–4.500)

## 2020-04-01 LAB — MAGNESIUM: Magnesium: 2.1 mg/dL (ref 1.6–2.3)

## 2020-04-03 ENCOUNTER — Telehealth: Payer: Self-pay | Admitting: Nurse Practitioner

## 2020-04-03 DIAGNOSIS — N289 Disorder of kidney and ureter, unspecified: Secondary | ICD-10-CM

## 2020-04-03 NOTE — Telephone Encounter (Signed)
Order placed to recheck kidney function.

## 2020-04-07 ENCOUNTER — Encounter: Payer: Self-pay | Admitting: *Deleted

## 2020-04-07 ENCOUNTER — Telehealth: Payer: 59

## 2020-04-10 ENCOUNTER — Ambulatory Visit (INDEPENDENT_AMBULATORY_CARE_PROVIDER_SITE_OTHER): Payer: 59 | Admitting: Surgery

## 2020-04-10 ENCOUNTER — Encounter: Payer: Self-pay | Admitting: Surgery

## 2020-04-10 ENCOUNTER — Other Ambulatory Visit: Payer: Self-pay

## 2020-04-10 VITALS — BP 164/91 | HR 73 | Temp 97.9°F | Resp 15 | Ht 69.75 in | Wt 275.0 lb

## 2020-04-10 DIAGNOSIS — D170 Benign lipomatous neoplasm of skin and subcutaneous tissue of head, face and neck: Secondary | ICD-10-CM | POA: Diagnosis not present

## 2020-04-10 DIAGNOSIS — D171 Benign lipomatous neoplasm of skin and subcutaneous tissue of trunk: Secondary | ICD-10-CM

## 2020-04-10 DIAGNOSIS — L723 Sebaceous cyst: Secondary | ICD-10-CM

## 2020-04-10 NOTE — Progress Notes (Signed)
04/10/2020  Reason for Visit:  Posterior scalp mass  Referring Provider:  Carnella Guadalajara, NP  History of Present Illness: Steven Vang is a 64 y.o. male presenting with a posterior scalp mass.  Patient reports that he used to have a lipoma in that area that was removed years ago, but it has grown back.  He denies any pain in the area, but reports "it's there".  Denies any drainage or redness or flare up.  He does report that he also has other areas of masses in his body.  He reports an area in his mid to lower back which has swollen before and drained fluid, and you can express fluid when squeezing on it during flareups.  There are also two areas in the inferior portion of each breast that are sore to palpation.  Those haven't had any flareups or drainage issues, but are sore when you push on them.  Past Medical History: Past Medical History:  Diagnosis Date  . Abnormal EKG   . Anxiety disorder   . CAD (coronary artery disease)   . CHF (congestive heart failure) ([place])   . Chronic systolic heart failure ([place])   . Depression   . Diabetes mellitus ([place])   . GERD (gastroesophageal reflux disease)   . Hypertension   . Mixed hyperlipidemia   . Non morbid obesity due to excess calories   . Type 2 diabetes mellitus without complication, without long-term current use of insulin ([facility])      Past Surgical History: Past Surgical History:  Procedure Laterality Date  . CARDIAC CATHETERIZATION    . CHOLECYSTECTOMY    . CORONARY STENT PLACEMENT     3 stents  . HAND SURGERY Right     Home Medications: Prior to Admission medications   Medication Sig Start Date End Date Taking? Authorizing Provider  aspirin 81 MG EC tablet Take 1 tablet by mouth daily.   Yes [provider]  citalopram (CELEXA) 40 MG tablet Take 1 tablet (40 mg total) by mouth daily. 03/03/20  Yes Carnella Guadalajara I, NP  clopidogrel (PLAVIX) 75 MG tablet Take 1 tablet (75 mg total) by mouth daily. 03/03/20  Yes Carnella Guadalajara I, NP  famotidine (PEPCID) 20 MG tablet Take 1 tablet (20 mg total) by mouth 2 (two) times daily. 03/31/20  Yes Carnella Guadalajara I, NP  furosemide (LASIX) 40 MG tablet Take 40 mg by mouth daily. 01/14/20  Yes [provider]  gabapentin (NEURONTIN) 300 MG capsule Take 1-2 capsules (300-600 mg total) by mouth at bedtime as needed. 03/03/20  Yes Carnella Guadalajara I, NP  losartan (COZAAR) 50 MG tablet Take 1 tablet (50 mg total) by mouth daily. 03/03/20  Yes Carnella Guadalajara I, NP  metFORMIN (GLUCOPHAGE) 1000 MG tablet Take 1 tablet (1,000 mg total) by mouth 2 (two) times daily with a meal. 03/03/20  Yes Carnella Guadalajara I, NP  pantoprazole (PROTONIX) 40 MG tablet Take 1 tablet (40 mg total) by mouth daily. 03/31/20  Yes Carnella Guadalajara I, NP  pravastatin (PRAVACHOL) 40 MG tablet Take 0.5 tablets (20 mg total) by mouth every evening. 03/03/20  Yes Carnella Guadalajara I, NP  traZODone (DESYREL) 50 MG tablet Take 1 tablet (50 mg total) by mouth at bedtime. PRN 03/03/20  Yes Carnella Guadalajara I, NP    Allergies: No Known Allergies  Social History:  reports that he has quit smoking. His smoking use included cigarettes. He has never used smokeless tobacco. He reports previous alcohol use. He reports  that he does not use drugs.   Family History: Family History  Problem Relation Age of Onset  . CAD Mother   . Diabetes Mother   . Colon cancer Father   . Breast cancer Paternal Grandmother   . Stomach cancer Paternal Grandfather     Review of Systems: Review of Systems  Constitutional: Negative for chills and fever.  HENT: Negative for hearing loss.   Eyes: Negative for blurred vision.  Respiratory: Negative for shortness of breath.   Cardiovascular: Negative for chest pain.  Gastrointestinal: Negative for abdominal pain, nausea and vomiting.  Genitourinary: Negative for dysuria.  Musculoskeletal: Negative for myalgias.  Skin:       Posterior scalp mass, lower back mass, bilateral chest mass.   Neurological: Negative for dizziness.  Psychiatric/Behavioral: Negative for depression.    Physical Exam BP (!) 164/91   Pulse 73   Temp 97.9 F (36.6 C)   Resp 15   Ht 5' 9.75" (1.772 m)   Wt 275 lb (124.7 kg)   SpO2 96%   BMI 39.74 kg/m  CONSTITUTIONAL: No acute distress HEENT:  Normocephalic, atraumatic, extraocular motion intact. NECK: Trachea is midline, and there is no jugular venous distension.  RESPIRATORY:  Lungs are clear, and breath sounds are equal bilaterally. Normal respiratory effort without pathologic use of accessory muscles. CARDIOVASCULAR: Heart is regular without murmurs, gallops, or rubs. GI: The abdomen is soft, obese, non-distended, non-tender.  MUSCULOSKELETAL:  Normal muscle strength and tone in all four extremities.  No peripheral edema or cyanosis. SKIN: The patient has a 3-4 cm mass in the posterior right lower scalp, about the level of the ear, which is soft, mobile, and non-tender.  There is a scar from his prior excision at the center of this mass.  It is non-tender to palpation.  Over his lower back, there is a 2 cm mass consistent with a sebaceous cyst, with a skin pore opening leading to the cyst.  There is no erythema or induration or drainage.  Over the left inframammary fold, midclavicular line, there is a 1 cm mass consistent with lipoma that is sore to palpation.  It is soft and mobile. Over the right chest, about 2 cm inferior to the inframammary fold, midclavicular line, there is a 1 cm mass as well, which is also soft and mobile, consistent with lipoma. NEUROLOGIC:  Motor and sensation is grossly normal.  Cranial nerves are grossly intact. PSYCH:  Alert and oriented to person, place and time. Affect is normal.  Laboratory Analysis: No results found for this or any previous visit (from the past 24 hour(s)).  Imaging: No results found.  Assessment and Plan: This is a 64 y.o. male with a posterior right scalp lipoma, lower back sebaceous cyst,  and bilateral chest lipomas.  Discussed with the patient that we can excise all the masses in the office.  However, given the locations, and also the fact that he's on ASA and Plavix, would be best to do this in stepwise fashion.  We can start with the posterior masses first, and we can do the scalp and back areas.  Based on his schedule and mine, we can schedule him for office procedure on 04/21/20.  He would stop his ASA and Plavix 5 days prior, with last dose on 5/1.  Discussed with him that if everything goes well, he likely will be able to resume both medications on 04/23/20 vs 04/24/20.  We would then wait about two weeks to see him post-op  and schedule for the next round when we would excise the two chest masses, also after stopping ASA/Plavix for 5 days.  That way he's not off the medications for too long and we risk less with the stents he has.  He is in agreement with this plan, and all of his questions have been answered.  Face-to-face time spent with the patient and care providers was 60 minutes, with more than 50% of the time spent counseling, educating, and coordinating care of the patient.     Melvyn Neth, [facility]

## 2020-04-10 NOTE — Patient Instructions (Addendum)
We will schedule you for Lipoma and sebaceous cyst removal in office on Friday May 7th. You will need to be off of your Plavix and Aspirin for 5 days prior to your procedure.  Your last dose of Aspirin and Plavix will be on Saturday May 1st.  Epidermal Cyst  An epidermal cyst is a sac made of skin tissue. The sac contains a substance called keratin. Keratin is a protein that is normally secreted through the hair follicles. When keratin becomes trapped in the top layer of skin (epidermis), it can form an epidermal cyst. Epidermal cysts can be found anywhere on your body. These cysts are usually harmless (benign), and they may not cause symptoms unless they become infected. What are the causes? This condition may be caused by:  A blocked hair follicle.  A hair that curls and re-enters the skin instead of growing straight out of the skin (ingrown hair).  A blocked pore.  Irritated skin.  An injury to the skin.  Certain conditions that are passed along from parent to child (inherited).  Human papillomavirus (HPV).  Long-term (chronic) sun damage to the skin. What increases the risk? The following factors may make you more likely to develop an epidermal cyst:  Having acne.  Being overweight.  Being 62-68 years old. What are the signs or symptoms? The only symptom of this condition may be a small, painless lump underneath the skin. When an epidermal cyst ruptures, it may become infected. Symptoms may include:  Redness.  Inflammation.  Tenderness.  Warmth.  Fever.  Keratin draining from the cyst. Keratin is grayish-white, bad-smelling substance.  Pus draining from the cyst. How is this diagnosed? This condition is diagnosed with a physical exam.  In some cases, you may have a sample of tissue (biopsy) taken from your cyst to be examined under a microscope or tested for bacteria.  You may be referred to a health care provider who specializes in skin care  (dermatologist). How is this treated? In many cases, epidermal cysts go away on their own without treatment. If a cyst becomes infected, treatment may include:  Opening and draining the cyst, done by a health care provider. After draining, minor surgery to remove the rest of the cyst may be done.  Antibiotic medicine.  Injections of medicines (steroids) that help to reduce inflammation.  Surgery to remove the cyst. Surgery may be done if the cyst: ? Becomes large. ? Bothers you. ? Has a chance of turning into cancer.  Do not try to open a cyst yourself. Follow these instructions at home:  Take over-the-counter and prescription medicines only as told by your health care provider.  If you were prescribed an antibiotic medicine, take it it as told by your health care provider. Do not stop using the antibiotic even if you start to feel better.  Keep the area around your cyst clean and dry.  Wear loose, dry clothing.  Avoid touching your cyst.  Check your cyst every day for signs of infection. Check for: ? Redness, swelling, or pain. ? Fluid or blood. ? Warmth. ? Pus or a bad smell.  Keep all follow-up visits as told by your health care provider. This is important. How is this prevented?  Wear clean, dry, clothing.  Avoid wearing tight clothing.  Keep your skin clean and dry. Take showers or baths every day. Contact a health care provider if:  Your cyst develops symptoms of infection.  Your condition is not improving or is getting worse.  You develop a cyst that looks different from other cysts you have had.  You have a fever. Get help right away if:  Redness spreads from the cyst into the surrounding area. Summary  An epidermal cyst is a sac made of skin tissue. These cysts are usually harmless (benign), and they may not cause symptoms unless they become infected.  If a cyst becomes infected, treatment may include surgery to open and drain the cyst, or to remove  it. Treatment may also include medicines by mouth or through an injection.  Take over-the-counter and prescription medicines only as told by your health care provider. If you were prescribed an antibiotic medicine, take it as told by your health care provider. Do not stop using the antibiotic even if you start to feel better.  Contact a health care provider if your condition is not improving or is getting worse.  Keep all follow-up visits as told by your health care provider. This is important. This information is not intended to replace advice given to you by your health care provider. Make sure you discuss any questions you have with your health care provider. Document Revised: 03/25/2019 Document Reviewed: 06/15/2018 Elsevier Patient Education  [place].

## 2020-04-12 ENCOUNTER — Telehealth: Payer: Self-pay

## 2020-04-12 NOTE — Telephone Encounter (Signed)
Pt returned call to reschedule appt .

## 2020-04-18 ENCOUNTER — Other Ambulatory Visit: Payer: Self-pay

## 2020-04-18 DIAGNOSIS — E119 Type 2 diabetes mellitus without complications: Secondary | ICD-10-CM

## 2020-04-18 MED ORDER — GABAPENTIN 300 MG PO CAPS: 300.0000 mg | ORAL_CAPSULE | Freq: Every evening | ORAL | 2 refills | Status: DC | PRN

## 2020-04-21 ENCOUNTER — Ambulatory Visit: Payer: 59 | Admitting: Surgery

## 2020-04-21 ENCOUNTER — Telehealth (INDEPENDENT_AMBULATORY_CARE_PROVIDER_SITE_OTHER): Payer: Self-pay | Admitting: Gastroenterology

## 2020-04-21 DIAGNOSIS — Z1211 Encounter for screening for malignant neoplasm of colon: Secondary | ICD-10-CM

## 2020-04-21 NOTE — Progress Notes (Signed)
Gastroenterology Pre-Procedure Review  Request Date: Friday 05/12/20 Requesting Physician: Dr. Allen Norris  PATIENT REVIEW QUESTIONS: The patient responded to the following health history questions as indicated:    1. Are you having any GI issues? no 2. Do you have a personal history of Polyps? no 3. Do you have a family history of Colon Cancer or Polyps? no 4. Diabetes Mellitus?yes oral meds 5. Joint replacements in the past 12 months?no 6. Major health problems in the past 3 months?no 7. Any artificial heart valves, MVP, or defibrillator?no    MEDICATIONS & ALLERGIES:    Patient reports the following regarding taking any anticoagulation/antiplatelet therapy:   Plavix, Coumadin, Eliquis, Xarelto, Lovenox, Pradaxa, Brilinta, or Effient? Plavix  Aspirin? 81 mg  Patient confirms/reports the following medications:  Current Outpatient Medications  Medication Sig Dispense Refill  . aspirin 81 MG EC tablet Take 1 tablet by mouth daily.    . citalopram (CELEXA) 40 MG tablet Take 1 tablet (40 mg total) by mouth daily. 90 tablet 0  . clopidogrel (PLAVIX) 75 MG tablet Take 1 tablet (75 mg total) by mouth daily. 90 tablet 0  . famotidine (PEPCID) 20 MG tablet Take 1 tablet (20 mg total) by mouth 2 (two) times daily. 180 tablet 0  . furosemide (LASIX) 40 MG tablet Take 40 mg by mouth daily.    Marland Kitchen gabapentin (NEURONTIN) 300 MG capsule Take 1-2 capsules (300-600 mg total) by mouth at bedtime as needed. 90 capsule 2  . losartan (COZAAR) 50 MG tablet Take 1 tablet (50 mg total) by mouth daily. 90 tablet 0  . metFORMIN (GLUCOPHAGE) 1000 MG tablet Take 1 tablet (1,000 mg total) by mouth 2 (two) times daily with a meal. 180 tablet 0  . metoprolol tartrate (LOPRESSOR) 100 MG tablet     . pantoprazole (PROTONIX) 40 MG tablet Take 1 tablet (40 mg total) by mouth daily. 90 tablet 0  . pravastatin (PRAVACHOL) 40 MG tablet Take 0.5 tablets (20 mg total) by mouth every evening. 90 tablet 0  . traZODone (DESYREL) 50  MG tablet Take 1 tablet (50 mg total) by mouth at bedtime. PRN 90 tablet 0   No current facility-administered medications for this visit.    Patient confirms/reports the following allergies:  No Known Allergies  No orders of the defined types were placed in this encounter.   AUTHORIZATION INFORMATION Primary Insurance: 1D#: Group #:  Secondary Insurance: 1D#: Group #:  SCHEDULE INFORMATION: Date: Friday 05/12/20 Time: Location:[facility]

## 2020-04-24 ENCOUNTER — Other Ambulatory Visit: Payer: Self-pay

## 2020-04-28 ENCOUNTER — Other Ambulatory Visit: Payer: Self-pay

## 2020-04-28 ENCOUNTER — Encounter: Payer: Self-pay | Admitting: Surgery

## 2020-04-28 ENCOUNTER — Other Ambulatory Visit: Payer: Self-pay | Admitting: Surgery

## 2020-04-28 ENCOUNTER — Ambulatory Visit (INDEPENDENT_AMBULATORY_CARE_PROVIDER_SITE_OTHER): Payer: 59 | Admitting: Surgery

## 2020-04-28 VITALS — BP 123/80 | HR 73 | Temp 97.9°F | Ht 69.75 in | Wt 267.0 lb

## 2020-04-28 DIAGNOSIS — L723 Sebaceous cyst: Secondary | ICD-10-CM

## 2020-04-28 DIAGNOSIS — D170 Benign lipomatous neoplasm of skin and subcutaneous tissue of head, face and neck: Secondary | ICD-10-CM | POA: Diagnosis not present

## 2020-04-28 MED ORDER — OXYCODONE HCL 5 MG PO TABS: 5.0000 mg | ORAL_TABLET | Freq: Four times a day (QID) | ORAL | 0 refills | Status: DC | PRN

## 2020-04-28 NOTE — Patient Instructions (Addendum)
We have removed a Lipoma and a Cyst in our office today.  You may resume your Plavix and Aspirin on Sunday(04/20/15/21)  Follow up here in one week.  You have sutures under the skin that will dissolve and also dermabond (skin glue) on top of your skin which will come off on it's own in 10-14 days.  You may shower, do not scrub the areas.  You may use the ice pack provided to help prevent bleeding, swelling, and for comfort. We have given you a prescription for Oxycodone for more intense pain control. You may also use Tylenol as needed for pain control. You may use Ibuprofen, but only until you resume your Plavix and Aspirin.  Avoid Strenuous activities that will make you sweat during the next 48 hours to avoid the glue coming off prematurely. Avoid activities that will place pressure to this area of the body for 1-2 weeks to avoid re-injury to incision site.  Please see your follow-up appointment provided. We will see you back in office to make sure this area is healed and to review the final pathology. If you have any questions or concerns prior to this appointment, call our office and speak with a nurse.    Excision of Skin Cysts or Lesions Excision of a skin lesion refers to the removal of a section of skin by making small cuts (incisions) in the skin. This procedure may be done to remove a cancerous (malignant) or noncancerous (benign) growth on the skin. It is typically done to treat or prevent cancer or infection. It may also be done to improve cosmetic appearance. The procedure may be done to remove:  Cancerous growths, such as basal cell carcinoma, squamous cell carcinoma, or melanoma.  Noncancerous growths, such as a cyst or lipoma.  Growths, such as moles or skin tags, which may be removed for cosmetic reasons.  Various excision or surgical techniques may be used depending on your condition, the location of the lesion, and your overall health. Tell a health care provider  about:  Any allergies you have.  All medicines you are taking, including vitamins, herbs, eye drops, creams, and over-the-counter medicines.  Any problems you or family members have had with anesthetic medicines.  Any blood disorders you have.  Any surgeries you have had.  Any medical conditions you have.  Whether you are pregnant or may be pregnant. What are the risks? Generally, this is a safe procedure. However, problems may occur, including:  Bleeding.  Infection.  Scarring.  Recurrence of the cyst, lipoma, or cancer.  Changes in skin sensation or appearance, such as discoloration or swelling.  Reaction to the anesthetics.  Allergic reaction to surgical materials or ointments.  Damage to nerves, blood vessels, muscles, or other structures.  Continued pain.  What happens before the procedure?  Ask your health care provider about: ? Changing or stopping your regular medicines. This is especially important if you are taking diabetes medicines or blood thinners. ? Taking medicines such as aspirin and ibuprofen. These medicines can thin your blood. Do not take these medicines before your procedure if your health care provider instructs you not to.  You may be asked to take certain medicines.  You may be asked to stop smoking.  You may have an exam or testing.  Plan to have someone take you home after the procedure.  Plan to have someone help you with activities during recovery. What happens during the procedure?  To reduce your risk of infection: ? Your  health care team will wash or sanitize their hands. ? Your skin will be washed with soap.  You will be given a medicine to numb the area (local anesthetic).  One of the following excision techniques will be performed.  At the end of any of these procedures, antibiotic ointment will be applied as needed. Each of the following techniques may vary among health care providers and hospitals. Complete Surgical  Excision The area of skin that needs to be removed will be marked with a pen. Using a small scalpel or scissors, the surgeon will gently cut around and under the lesion until it is completely removed. The lesion will be placed in a fluid and sent to the lab for examination. If necessary, bleeding will be controlled with a device that delivers heat (electrocautery). The edges of the wound may be stitched (sutured) together, and a bandage (dressing) will be applied. This procedure may be performed to treat a cancerous growth or a noncancerous cyst or lesion. Excision of a Cyst The surgeon will make an incision on the cyst. The entire cyst will be removed through the incision. The incision may be closed with sutures. Shave Excision During shave excision, the surgeon will use a small blade or an electrically heated loop instrument to shave off the lesion. This may be done to remove a mole or a skin tag. The wound will usually be left to heal on its own without sutures. Punch Excision During punch excision, the surgeon will use a small tool that is like a cookie cutter or a hole punch to cut a circle shape out of the skin. The outer edges of the skin will be sutured together. This may be done to remove a mole or a scar or to perform a biopsy of the lesion. Mohs Micrographic Surgery During Mohs micrographic surgery, layers of the lesion will be removed with a scalpel or a loop instrument and will be examined right away under a microscope. Layers will be removed until all of the abnormal or cancerous tissue has been removed. This procedure is minimally invasive, and it ensures the best cosmetic outcome. It involves the removal of as little normal tissue as possible. Mohs is usually done to treat skin cancer, such as basal cell carcinoma or squamous cell carcinoma, particularly on the face and ears. Depending on the size of the surgical wound, it may be sutured closed. What happens after the procedure?  Return  to your normal activities as told by your health care provider.  Talk with your health care provider to discuss any test results, treatment options, and if necessary, the need for more tests. This information is not intended to replace advice given to you by your health care provider. Make sure you discuss any questions you have with your health care provider. Document Released: 02/26/2010 Document Revised: 05/09/2016 Document Reviewed: 01/18/2015 Elsevier Interactive Patient Education  [employer].

## 2020-04-28 NOTE — Progress Notes (Signed)
  Procedure Date:  04/28/2020  Pre-operative Diagnosis:  Right posterior scalp lipoma, left lower back sebaceous cyst  Post-operative Diagnosis: Right posterior scalp lipoma, left lower back sebaceous cyst  Procedure:  Excision of right posterior scalp lipoma and left lower back sebaceous cyst  Surgeon:  Melvyn Neth, MD  Anesthesia:  25 ml of 1% lidocaine with epi  Estimated Blood Loss:  20 ml  Specimens:  Right posterior scalp lipoma, left lower back sebaceous cyst  Complications:  None  Indications for Procedure:  This is a 64 y.o. male with diagnosis of a symptomatic right posterior scalp lipoma and a left lower back sebaceous cyst.  The patient wishes to have them excised. The risks of bleeding, abscess or infection, injury to surrounding structures, and need for further procedures were all discussed with the patient and he was willing to proceed.  Description of Procedure: The patient was correctly identified at bedside.  The patient was placed prone.  Appropriate time-outs were performed.  The patient's posterior scalp was prepped and draped in usual sterile fashion.  Local anesthetic was infused intradermally.  A 5 cm incision was made over the lipoma, in the location of prior excision, and scalpel was used to dissect down the subcutaneous tissue to the lipoma itself.  Skin flaps were created using scalpel as well, and then the lipoma was excised using scalpel.  It was sent off to pathology.  The cavity was then irrigated and hemostasis was assured with pressure and gauze packing.  Following that, the wound was then closed in two layers using 3-0 Vicryl and 4-0 Monocryl.  The incision was cleaned and sealed with DermaBond.  We then proceeded to the left lower back.  The patient's left lower back was prepped and draped in usual sterile fashion.  Local anesthetic was infused intradermally.  A 2.5 cm elliptical incision was made over the cyst incorporating its draining pore, and  scalpel was used to dissect down the subcutaneous tissue to the cyst itself.  Skin flaps were created using scalpel as well, and then the cyst was excised using scalpel.  It was sent off to pathology.  The cavity was then irrigated and hemostasis was assured with pressure and one 3-0 Vicryl suture.  Following that, the wound was then closed in two layers using 3-0 Vicryl and 4-0 Monocryl.  The incision was cleaned and sealed with DermaBond.  The patient tolerated the procedure well and all sharps were appropriately disposed of at the end of the case.   --Patient may apply ice packs to the wounds for comfort. --May take Tylenol and will given a prescription for Oxycodone.   --Resume Aspirin and Plavix on 5/16. --Follow up in one week.   Melvyn Neth, MD

## 2020-05-02 ENCOUNTER — Telehealth: Payer: Self-pay

## 2020-05-02 NOTE — Telephone Encounter (Signed)
LVM advising patient of blood thinner advice prior to his 05/12/20 colonoscopy per Carnella [place].  Patient has been advised to stop Plavix 7 days prior to his colonoscopy and restart 1 day after procedure.  Thank you,  Sharyn Lull, CMA

## 2020-05-04 ENCOUNTER — Encounter: Payer: Self-pay | Admitting: Gastroenterology

## 2020-05-05 ENCOUNTER — Encounter: Payer: Self-pay | Admitting: Surgery

## 2020-05-05 ENCOUNTER — Ambulatory Visit: Payer: Self-pay | Admitting: Cardiovascular Disease

## 2020-05-05 ENCOUNTER — Other Ambulatory Visit: Payer: Self-pay

## 2020-05-05 ENCOUNTER — Ambulatory Visit (INDEPENDENT_AMBULATORY_CARE_PROVIDER_SITE_OTHER): Payer: Self-pay | Admitting: Surgery

## 2020-05-05 VITALS — BP 116/73 | HR 71 | Temp 97.9°F | Ht 69.5 in | Wt 271.8 lb

## 2020-05-05 DIAGNOSIS — D170 Benign lipomatous neoplasm of skin and subcutaneous tissue of head, face and neck: Secondary | ICD-10-CM

## 2020-05-05 DIAGNOSIS — Z09 Encounter for follow-up examination after completed treatment for conditions other than malignant neoplasm: Secondary | ICD-10-CM

## 2020-05-05 DIAGNOSIS — L723 Sebaceous cyst: Secondary | ICD-10-CM

## 2020-05-05 DIAGNOSIS — I200 Unstable angina: Secondary | ICD-10-CM

## 2020-05-05 MED ORDER — SODIUM CHLORIDE 0.9% FLUSH
3.0000 mL | Freq: Two times a day (BID) | INTRAVENOUS | Status: DC
  Filled 2020-05-05: qty 3

## 2020-05-05 NOTE — Patient Instructions (Addendum)
Please give our office a call if any questions or concerns   Wound Care, Adult Taking care of your wound properly can help to prevent pain, infection, and scarring. It can also help your wound to heal more quickly. How to care for your wound Wound care      Follow instructions from your health care provider about how to take care of your wound. Make sure you: ? Wash your hands with soap and water before you change the bandage (dressing). If soap and water are not available, use hand sanitizer. ? Change your dressing as told by your health care provider. ? Leave stitches (sutures), skin glue, or adhesive strips in place. These skin closures may need to stay in place for 2 weeks or longer. If adhesive strip edges start to loosen and curl up, you may trim the loose edges. Do not remove adhesive strips completely unless your health care provider tells you to do that.  Check your wound area every day for signs of infection. Check for: ? Redness, swelling, or pain. ? Fluid or blood. ? Warmth. ? Pus or a bad smell.  Ask your health care provider if you should clean the wound with mild soap and water. Doing this may include: ? Using a clean towel to pat the wound dry after cleaning it. Do not rub or scrub the wound. ? Applying a cream or ointment. Do this only as told by your health care provider. ? Covering the incision with a clean dressing.  Ask your health care provider when you can leave the wound uncovered.  Keep the dressing dry until your health care provider says it can be removed. Do not take baths, swim, use a hot tub, or do anything that would put the wound underwater until your health care provider approves. Ask your health care provider if you can take showers. You may only be allowed to take sponge baths. Medicines   If you were prescribed an antibiotic medicine, cream, or ointment, take or use the antibiotic as told by your health care provider. Do not stop taking or using the  antibiotic even if your condition improves.  Take over-the-counter and prescription medicines only as told by your health care provider. If you were prescribed pain medicine, take it 30 or more minutes before you do any wound care or as told by your health care provider. General instructions  Return to your normal activities as told by your health care provider. Ask your health care provider what activities are safe.  Do not scratch or pick at the wound.  Do not use any products that contain nicotine or tobacco, such as cigarettes and e-cigarettes. These may delay wound healing. If you need help quitting, ask your health care provider.  Keep all follow-up visits as told by your health care provider. This is important.  Eat a diet that includes protein, vitamin A, vitamin C, and other nutrient-rich foods to help the wound heal. ? Foods rich in protein include meat, dairy, beans, nuts, and other sources. ? Foods rich in vitamin A include carrots and dark green, leafy vegetables. ? Foods rich in vitamin C include citrus, tomatoes, and other fruits and vegetables. ? Nutrient-rich foods have protein, carbohydrates, fat, vitamins, or minerals. Eat a variety of healthy foods including vegetables, fruits, and whole grains. Contact a health care provider if:  You received a tetanus shot and you have swelling, severe pain, redness, or bleeding at the injection site.  Your pain is not controlled  with medicine.  You have redness, swelling, or pain around the wound.  You have fluid or blood coming from the wound.  Your wound feels warm to the touch.  You have pus or a bad smell coming from the wound.  You have a fever or chills.  You are nauseous or you vomit.  You are dizzy. Get help right away if:  You have a red streak going away from your wound.  The edges of the wound open up and separate.  Your wound is bleeding, and the bleeding does not stop with gentle pressure.  You have a  rash.  You faint.  You have trouble breathing. Summary  Always wash your hands with soap and water before changing your bandage (dressing).  To help with healing, eat foods that are rich in protein, vitamin A, vitamin C, and other nutrients.  Check your wound every day for signs of infection. Contact your health care provider if you suspect that your wound is infected. This information is not intended to replace advice given to you by your health care provider. Make sure you discuss any questions you have with your health care provider. Document Revised: 03/22/2019 Document Reviewed: 06/18/2016 Elsevier Patient Education  [employer].

## 2020-05-05 NOTE — Progress Notes (Signed)
05/05/2020  HPI: Caisyn Caputo Hassell is a 64 y.o. male s/p excision of posterior scalp lipoma as well as excision of lower back sebaceous cyst on 04/28/2020.  Patient presents today for follow-up.  Patient reports that he has been doing very well and denies any significant pain or issues with either of the incisions.  Vital signs: BP 116/73   Pulse 71   Temp 97.9 F (36.6 C) (Temporal)   Ht 5' 9.5" (1.765 m)   Wt 271 lb 12.8 oz (123.3 kg)   BMI 39.56 kg/m    Physical Exam: Constitutional: No acute distress Skin: Posterior scalp incision is healing well with no evidence of breakdown or infection.  There is slight amount of puffiness or swelling but otherwise no palpable masses.  Lower back incision is also healing well with Dermabond still in place.  There is no evidence of infection or recurrence.  Assessment/Plan: This is a 64 y.o. male s/p excision of posterior scalp lipoma and excision of lower back sebaceous cyst.  -Discussed pathology results with the patient.  The scalp mass was consistent with a lipoma and the one in his lower back was consistent with a sebaceous cyst.  Both benign. -Patient is healing very well at this point no further follow-up as needed.  He does have further masses in the anterior chest wall bilaterally.  Discussed with him that he can call us at any time to schedule procedure visits for excision of these.   Melvyn Neth, [facility]

## 2020-05-08 ENCOUNTER — Encounter: Payer: Self-pay | Admitting: Gastroenterology

## 2020-05-08 ENCOUNTER — Other Ambulatory Visit: Payer: Self-pay

## 2020-05-08 ENCOUNTER — Encounter: Payer: Self-pay | Admitting: Anesthesiology

## 2020-05-12 ENCOUNTER — Ambulatory Visit: Admission: RE | Admit: 2020-05-12 | Payer: 59 | Source: Home / Self Care | Admitting: Gastroenterology

## 2020-05-12 SURGERY — COLONOSCOPY WITH PROPOFOL
Anesthesia: General

## 2020-05-19 DIAGNOSIS — I200 Unstable angina: Secondary | ICD-10-CM | POA: Insufficient documentation

## 2020-05-29 ENCOUNTER — Other Ambulatory Visit: Payer: Self-pay

## 2020-05-29 DIAGNOSIS — F330 Major depressive disorder, recurrent, mild: Secondary | ICD-10-CM

## 2020-05-29 MED ORDER — TRAZODONE HCL 50 MG PO TABS: 50.0000 mg | ORAL_TABLET | Freq: Every day | ORAL | 0 refills | Status: DC

## 2020-05-29 NOTE — Telephone Encounter (Signed)
Looks like this is your patient

## 2020-05-31 ENCOUNTER — Other Ambulatory Visit: Payer: Self-pay

## 2020-05-31 ENCOUNTER — Other Ambulatory Visit
Admission: RE | Admit: 2020-05-31 | Discharge: 2020-05-31 | Disposition: A | Discharge status: Home / Self Care | Payer: 59 | Source: Ambulatory Visit | Attending: Cardiovascular Disease | Admitting: Cardiovascular Disease

## 2020-05-31 DIAGNOSIS — Z01812 Encounter for preprocedural laboratory examination: Secondary | ICD-10-CM | POA: Insufficient documentation

## 2020-05-31 DIAGNOSIS — Z20822 Contact with and (suspected) exposure to covid-19: Secondary | ICD-10-CM | POA: Insufficient documentation

## 2020-06-01 LAB — SARS CORONAVIRUS 2 (TAT 6-24 HRS): SARS Coronavirus 2: NEGATIVE

## 2020-06-02 ENCOUNTER — Encounter: Payer: Self-pay | Admitting: Cardiovascular Disease

## 2020-06-02 ENCOUNTER — Ambulatory Visit
Admission: RE | Admit: 2020-06-02 | Discharge: 2020-06-02 | Disposition: A | Discharge status: Home / Self Care | Payer: 59 | Attending: Cardiovascular Disease | Admitting: Cardiovascular Disease

## 2020-06-02 ENCOUNTER — Encounter
Admission: RE | Disposition: A | Discharge status: Home / Self Care | Payer: Self-pay | Source: Home / Self Care | Attending: Cardiovascular Disease

## 2020-06-02 DIAGNOSIS — E669 Obesity, unspecified: Secondary | ICD-10-CM | POA: Insufficient documentation

## 2020-06-02 DIAGNOSIS — I200 Unstable angina: Secondary | ICD-10-CM | POA: Diagnosis present

## 2020-06-02 DIAGNOSIS — R0602 Shortness of breath: Secondary | ICD-10-CM | POA: Insufficient documentation

## 2020-06-02 DIAGNOSIS — Z7982 Long term (current) use of aspirin: Secondary | ICD-10-CM | POA: Insufficient documentation

## 2020-06-02 DIAGNOSIS — Z955 Presence of coronary angioplasty implant and graft: Secondary | ICD-10-CM | POA: Diagnosis not present

## 2020-06-02 DIAGNOSIS — Z8639 Personal history of other endocrine, nutritional and metabolic disease: Secondary | ICD-10-CM | POA: Diagnosis not present

## 2020-06-02 DIAGNOSIS — I25110 Atherosclerotic heart disease of native coronary artery with unstable angina pectoris: Secondary | ICD-10-CM | POA: Insufficient documentation

## 2020-06-02 DIAGNOSIS — Z79899 Other long term (current) drug therapy: Secondary | ICD-10-CM | POA: Diagnosis not present

## 2020-06-02 DIAGNOSIS — Z6839 Body mass index (BMI) 39.0-39.9, adult: Secondary | ICD-10-CM | POA: Diagnosis not present

## 2020-06-02 DIAGNOSIS — Z7902 Long term (current) use of antithrombotics/antiplatelets: Secondary | ICD-10-CM | POA: Diagnosis not present

## 2020-06-02 HISTORY — PX: LEFT HEART CATH AND CORONARY ANGIOGRAPHY: CATH118249

## 2020-06-02 LAB — CBC
HCT: 39.1 % (ref 39.0–52.0)
Hemoglobin: 13.2 g/dL (ref 13.0–17.0)
MCH: 26.4 pg (ref 26.0–34.0)
MCHC: 33.8 g/dL (ref 30.0–36.0)
MCV: 78.2 fL — ABNORMAL LOW (ref 80.0–100.0)
Platelets: 172 10*3/uL (ref 150–400)
RBC: 5 MIL/uL (ref 4.22–5.81)
RDW: 13.5 % (ref 11.5–15.5)
WBC: 6.3 10*3/uL (ref 4.0–10.5)
nRBC: 0 % (ref 0.0–0.2)

## 2020-06-02 LAB — BASIC METABOLIC PANEL
Anion gap: 6 (ref 5–15)
BUN: 15 mg/dL (ref 8–23)
CO2: 26 mmol/L (ref 22–32)
Calcium: 8.8 mg/dL — ABNORMAL LOW (ref 8.9–10.3)
Chloride: 107 mmol/L (ref 98–111)
Creatinine, Ser: 1.3 mg/dL — ABNORMAL HIGH (ref 0.61–1.24)
GFR calc Af Amer: >60 mL/min (ref >60)
GFR calc non Af Amer: 58 mL/min — ABNORMAL LOW (ref >60)
Glucose, Bld: 162 mg/dL — ABNORMAL HIGH (ref 70–99)
Potassium: 4.3 mmol/L (ref 3.5–5.1)
Sodium: 139 mmol/L (ref 135–145)

## 2020-06-02 SURGERY — LEFT HEART CATH AND CORONARY ANGIOGRAPHY
Anesthesia: Moderate Sedation | Laterality: Left

## 2020-06-02 MED ORDER — VERAPAMIL HCL 2.5 MG/ML IV SOLN
INTRAVENOUS | Status: AC
  Filled 2020-06-02: qty 2

## 2020-06-02 MED ORDER — ONDANSETRON HCL 4 MG/2ML IJ SOLN: 4.0000 mg | Freq: Four times a day (QID) | INTRAMUSCULAR | Status: DC | PRN

## 2020-06-02 MED ORDER — IOHEXOL 300 MG/ML  SOLN
INTRAMUSCULAR | Status: DC | PRN
  Administered 2020-06-02: 85 mL

## 2020-06-02 MED ORDER — ASPIRIN 81 MG PO CHEW: 81.0000 mg | CHEWABLE_TABLET | ORAL | Status: AC

## 2020-06-02 MED ORDER — SODIUM CHLORIDE 0.9 % WEIGHT BASED INFUSION: 1.0000 mL/kg/h | INTRAVENOUS | Status: DC

## 2020-06-02 MED ORDER — MIDAZOLAM HCL 2 MG/2ML IJ SOLN
INTRAMUSCULAR | Status: DC | PRN
  Administered 2020-06-02 (×2): 1 mg via INTRAVENOUS

## 2020-06-02 MED ORDER — HEPARIN SODIUM (PORCINE) 1000 UNIT/ML IJ SOLN
INTRAMUSCULAR | Status: DC | PRN
  Administered 2020-06-02: 5000 [IU] via INTRAVENOUS

## 2020-06-02 MED ORDER — SODIUM BICARBONATE BOLUS VIA INFUSION
INTRAVENOUS | Status: DC
  Filled 2020-06-02: qty 1

## 2020-06-02 MED ORDER — SODIUM BICARBONATE-DEXTROSE 150-5 MEQ/L-% IV SOLN
INTRAVENOUS | Status: DC
  Filled 2020-06-02: qty 1000

## 2020-06-02 MED ORDER — HYDRALAZINE HCL 20 MG/ML IJ SOLN: 10.0000 mg | INTRAMUSCULAR | Status: DC | PRN

## 2020-06-02 MED ORDER — FENTANYL CITRATE (PF) 100 MCG/2ML IJ SOLN
INTRAMUSCULAR | Status: AC
  Filled 2020-06-02: qty 2

## 2020-06-02 MED ORDER — HEPARIN (PORCINE) IN NACL 1000-0.9 UT/500ML-% IV SOLN
INTRAVENOUS | Status: DC | PRN
  Administered 2020-06-02: 500 mL

## 2020-06-02 MED ORDER — SODIUM CHLORIDE 0.9 % IV SOLN: 250.0000 mL | INTRAVENOUS | Status: DC | PRN

## 2020-06-02 MED ORDER — SODIUM BICARBONATE BOLUS VIA INFUSION: INTRAVENOUS | Status: DC

## 2020-06-02 MED ORDER — SODIUM CHLORIDE 0.9% FLUSH: 3.0000 mL | Freq: Two times a day (BID) | INTRAVENOUS | Status: DC

## 2020-06-02 MED ORDER — VERAPAMIL HCL 2.5 MG/ML IV SOLN
INTRAVENOUS | Status: DC | PRN
  Administered 2020-06-02: 2.5 mg via INTRA_ARTERIAL

## 2020-06-02 MED ORDER — MIDAZOLAM HCL 2 MG/2ML IJ SOLN
INTRAMUSCULAR | Status: AC
  Filled 2020-06-02: qty 2

## 2020-06-02 MED ORDER — LIDOCAINE HCL (PF) 1 % IJ SOLN
INTRAMUSCULAR | Status: AC
  Filled 2020-06-02: qty 30

## 2020-06-02 MED ORDER — SODIUM CHLORIDE 0.9% FLUSH: 3.0000 mL | INTRAVENOUS | Status: DC | PRN

## 2020-06-02 MED ORDER — ASPIRIN 81 MG PO CHEW
CHEWABLE_TABLET | ORAL | Status: AC
  Administered 2020-06-02: 81 mg via ORAL
  Filled 2020-06-02: qty 1

## 2020-06-02 MED ORDER — SODIUM BICARBONATE-DEXTROSE 150-5 MEQ/L-% IV SOLN
INTRAVENOUS | Status: AC
  Filled 2020-06-02: qty 1000

## 2020-06-02 MED ORDER — FENTANYL CITRATE (PF) 100 MCG/2ML IJ SOLN
INTRAMUSCULAR | Status: DC | PRN
  Administered 2020-06-02: 50 ug via INTRAVENOUS
  Administered 2020-06-02: 25 ug via INTRAVENOUS

## 2020-06-02 MED ORDER — LABETALOL HCL 5 MG/ML IV SOLN: 10.0000 mg | INTRAVENOUS | Status: DC | PRN

## 2020-06-02 MED ORDER — HEPARIN SODIUM (PORCINE) 1000 UNIT/ML IJ SOLN
INTRAMUSCULAR | Status: AC
  Filled 2020-06-02: qty 1

## 2020-06-02 MED ORDER — SODIUM CHLORIDE 0.9 % WEIGHT BASED INFUSION
3.0000 mL/kg/h | INTRAVENOUS | Status: DC
  Administered 2020-06-02: 3 mL/kg/h via INTRAVENOUS

## 2020-06-02 MED ORDER — HEPARIN (PORCINE) IN NACL 1000-0.9 UT/500ML-% IV SOLN
INTRAVENOUS | Status: AC
  Filled 2020-06-02: qty 1000

## 2020-06-02 MED ORDER — ACETAMINOPHEN 325 MG PO TABS: 650.0000 mg | ORAL_TABLET | ORAL | Status: DC | PRN

## 2020-06-02 SURGICAL SUPPLY — 14 items
CATH INFINITI 5 FR JL3.5 (CATHETERS) ×3 IMPLANT
CATH INFINITI 5FR JK (CATHETERS) ×3 IMPLANT
CATH INFINITI 5FR JL4 (CATHETERS) ×3 IMPLANT
CATH INFINITI JR4 5F (CATHETERS) ×3 IMPLANT
DEVICE CLOSURE MYNXGRIP 5F (Vascular Products) ×3 IMPLANT
DEVICE RAD TR BAND REGULAR (VASCULAR PRODUCTS) ×3 IMPLANT
GLIDESHEATH SLEND SS 6F .021 (SHEATH) ×3 IMPLANT
GUIDEWIRE INQWIRE 1.5J.035X260 (WIRE) ×1 IMPLANT
INQWIRE 1.5J .035X260CM (WIRE) ×3
KIT MANI 3VAL PERCEP (MISCELLANEOUS) ×3 IMPLANT
NEEDLE PERC 18GX7CM (NEEDLE) ×3 IMPLANT
PACK CARDIAC CATH (CUSTOM PROCEDURE TRAY) ×3 IMPLANT
SHEATH AVANTI 5FR X 11CM (SHEATH) ×3 IMPLANT
WIRE GUIDERIGHT .035X150 (WIRE) ×3 IMPLANT

## 2020-06-16 ENCOUNTER — Other Ambulatory Visit: Payer: Self-pay

## 2020-06-16 DIAGNOSIS — I10 Essential (primary) hypertension: Secondary | ICD-10-CM

## 2020-06-16 NOTE — Telephone Encounter (Signed)
Patient last seen 03/31/20.

## 2020-06-16 NOTE — Telephone Encounter (Signed)
LVM for pt to call back to schedule appt

## 2020-06-16 NOTE — Telephone Encounter (Signed)
Needs appt

## 2020-06-20 NOTE — Telephone Encounter (Signed)
Appt scheduled for 06/23/20.

## 2020-06-23 ENCOUNTER — Other Ambulatory Visit: Payer: Self-pay

## 2020-06-23 ENCOUNTER — Encounter: Payer: Self-pay | Admitting: Nurse Practitioner

## 2020-06-23 ENCOUNTER — Ambulatory Visit (INDEPENDENT_AMBULATORY_CARE_PROVIDER_SITE_OTHER): Payer: 59 | Admitting: Nurse Practitioner

## 2020-06-23 VITALS — BP 109/67 | HR 65 | Temp 98.8°F | Ht 69.96 in | Wt 269.1 lb

## 2020-06-23 DIAGNOSIS — E119 Type 2 diabetes mellitus without complications: Secondary | ICD-10-CM

## 2020-06-23 DIAGNOSIS — R05 Cough: Secondary | ICD-10-CM

## 2020-06-23 DIAGNOSIS — F330 Major depressive disorder, recurrent, mild: Secondary | ICD-10-CM

## 2020-06-23 DIAGNOSIS — I25110 Atherosclerotic heart disease of native coronary artery with unstable angina pectoris: Secondary | ICD-10-CM

## 2020-06-23 DIAGNOSIS — E782 Mixed hyperlipidemia: Secondary | ICD-10-CM

## 2020-06-23 DIAGNOSIS — I10 Essential (primary) hypertension: Secondary | ICD-10-CM

## 2020-06-23 DIAGNOSIS — F411 Generalized anxiety disorder: Secondary | ICD-10-CM

## 2020-06-23 DIAGNOSIS — Z87891 Personal history of nicotine dependence: Secondary | ICD-10-CM

## 2020-06-23 DIAGNOSIS — K219 Gastro-esophageal reflux disease without esophagitis: Secondary | ICD-10-CM

## 2020-06-23 DIAGNOSIS — R059 Cough, unspecified: Secondary | ICD-10-CM | POA: Insufficient documentation

## 2020-06-23 DIAGNOSIS — Z1211 Encounter for screening for malignant neoplasm of colon: Secondary | ICD-10-CM

## 2020-06-23 MED ORDER — CLOPIDOGREL BISULFATE 75 MG PO TABS: 75.0000 mg | ORAL_TABLET | Freq: Every day | ORAL | 0 refills | Status: DC

## 2020-06-23 MED ORDER — METFORMIN HCL 1000 MG PO TABS: 1000.0000 mg | ORAL_TABLET | Freq: Two times a day (BID) | ORAL | 0 refills | Status: DC

## 2020-06-23 MED ORDER — TRAZODONE HCL 50 MG PO TABS: 50.0000 mg | ORAL_TABLET | Freq: Every day | ORAL | 0 refills | Status: DC

## 2020-06-23 MED ORDER — PANTOPRAZOLE SODIUM 40 MG PO TBEC: 40.0000 mg | DELAYED_RELEASE_TABLET | Freq: Every day | ORAL | 0 refills | Status: DC

## 2020-06-23 MED ORDER — CITALOPRAM HYDROBROMIDE 40 MG PO TABS: 40.0000 mg | ORAL_TABLET | Freq: Every day | ORAL | 0 refills | Status: DC

## 2020-06-23 MED ORDER — FLUTICASONE PROPIONATE 50 MCG/ACT NA SUSP
2.0000 | Freq: Every day | NASAL | 6 refills | Status: DC
Start: 2020-06-23 — End: 2020-12-27

## 2020-06-23 MED ORDER — PRAVASTATIN SODIUM 40 MG PO TABS: 20.0000 mg | ORAL_TABLET | Freq: Every evening | ORAL | 0 refills | Status: DC

## 2020-06-23 MED ORDER — GABAPENTIN 300 MG PO CAPS: 300.0000 mg | ORAL_CAPSULE | Freq: Every evening | ORAL | 2 refills | Status: DC | PRN

## 2020-06-23 MED ORDER — LOSARTAN POTASSIUM 50 MG PO TABS: 50.0000 mg | ORAL_TABLET | Freq: Every day | ORAL | 0 refills | Status: DC

## 2020-06-23 MED ORDER — FAMOTIDINE 20 MG PO TABS: 20.0000 mg | ORAL_TABLET | Freq: Two times a day (BID) | ORAL | 0 refills | Status: DC

## 2020-06-23 NOTE — Assessment & Plan Note (Signed)
Chronic, stable.  PHQ-9 elevated today, will refill citalopram and restart.  Patient to return to clinic if symptoms persist.

## 2020-06-23 NOTE — Assessment & Plan Note (Signed)
Chronic, stable.  Will refill pravastatin and continue for now.  Will recheck lipids at upcoming physical.

## 2020-06-23 NOTE — Progress Notes (Signed)
BP 109/67   Pulse 65   Temp 98.8 F (37.1 C)   Ht 5' 9.96" (1.777 m)   Wt 269 lb 2 oz (122.1 kg)   SpO2 96%   BMI 38.66 kg/m    Subjective:    Patient ID: Steven Vang, male    DOB: 15-Dec-1956, 64 y.o.   MRN: [id number]  HPI: Steven Vang is a 64 y.o. male presenting for follow up  Chief Complaint  Patient presents with  . Depression  . Anxiety  . Diabetes  . Hyperlipidemia   HYPERTENSION / HYPERLIPIDEMIA Currently taking Losartan and pravastatin 40 mg daily. Satisfied with current treatment? yes Duration of hypertension: chronic BP monitoring frequency: not checking BP medication side effects: no Past BP meds:  Losartan 50 mg daily Duration of hyperlipidemia: chronic Cholesterol medication side effects: no Cholesterol supplements: none Past cholesterol medications: pravastatin Medication compliance: good compliance Aspirin: yes Recent stressors: yes - job is very stressful Recurrent headaches: no Visual changes: no Palpitations: no Dyspnea: yes - being evaluated by Cardiologist Chest pain: no Lower extremity edema: no Dizzy/lightheaded: no  CORONARY ARTERY DISEASE Most recent heart catheterization in June 2021 with Dr. Humphrey Rolls - transferred care to Dr. Michiel Cowboy at [facility] who recently started Isosorbide and elected to continue Metoprolol and Lasix that Dr. Humphrey Rolls started.  He reports he is still having some shortness of breath and arm numbness but feels confident that Dr. Michiel Cowboy is "staying on top of it."   Does think anginal symptoms are improving since initiation of Isosorbide. Angina frequency: daily Chest pain: no Aggravating factors: stress Alleviating factors: thinks isosorbide is helping Edema: no Orthopnea: no Paroxysmal nocturnal dyspnea: yes Dyspnea on exertion: yes Pneumovax:  Up to Date Aspirin and Plavix: no  DIABETES Currently taking Metformin 1000 mg bid and tolerating well. Hypoglycemic episodes:no Polydipsia/polyuria: no Visual  disturbance: no Chest pain: no Paresthesias: no Glucose Monitoring: yes  Accucheck frequency: randomly  Fasting glucose: <100  Post prandial:  Evening:  Before meals: Blood Pressure Monitoring: not checking Retinal Examination: Up to Date Foot Exam: Not up to Date Diabetic Education: Not Completed Pneumovax: Up to Date Aspirin: yes  INSOMNIA Currently taking Trazodone and gabapentin prn for sleep and doing well. Duration: chronic Satisfied with sleep quality: yes Difficulty falling asleep: no Difficulty staying asleep: no Waking a few hours after sleep onset: no Early morning awakenings: no Daytime hypersomnolence: no Wakes feeling refreshed: yes Good sleep hygiene: yes Apnea: no Snoring: yes Depressed/anxious mood: yes Recent stress: yes Restless legs/nocturnal leg cramps: no Chronic pain/arthritis: yes  - improved with Co-Q-10 History of sleep study: yes Treatments attempted: Trazodone, Gabapentin   GERD GERD control status: controlled  Satisfied with current treatment? yes Heartburn frequency: rarely Medication side effects: no  Medication compliance: good compliance - missed some days especially when travelling for work Previous GERD medications: none Antacid use frequency:  none Duration: rare Nature: rare Location: rare Heartburn duration: rare Alleviatiating factors:  Taking medication Aggravating factors: certain foods - acidic and spicy Dysphagia: no Odynophagia:  no Hematemesis: no Blood in stool: no EGD: no  DEPRESSION AND ANXIETY Mood status: controlled Satisfied with current treatment?: yes Symptom severity: mild  Duration of current treatment : chronic Side effects: no Medication compliance: good compliance Psychotherapy/counseling: no  Previous psychiatric medications: celexa 40 mg Depressed mood: no Anxious mood: yes Anhedonia: no Significant weight loss or gain: no Insomnia: no Fatigue: no Feelings of worthlessness or guilt:  no Impaired concentration/indecisiveness: yes Suicidal ideations: no  Hopelessness: no Crying spells: no Depression screen [facility] 2/9 06/23/2020 03/03/2020 04/11/2015  Decreased Interest 0 - 3  Down, Depressed, Hopeless 0 - 2  PHQ - 2 Score 0 - 5  Altered sleeping 0 2 2  Tired, decreased energy 0 3 3  Change in appetite 2 1 2   Feeling bad or failure about yourself  0 1 2  Trouble concentrating 1 2 3   Moving slowly or fidgety/restless 1 0 1  Suicidal thoughts 0 0 0  PHQ-9 Score 4 - 18  Difficult doing work/chores Somewhat difficult - Very difficult   GAD 7 : Generalized Anxiety Score 06/23/2020 03/03/2020  Nervous, Anxious, on Edge 1 1  Control/stop worrying 1 -  Worry too much - different things 1 3  Trouble relaxing 1 2  Restless 0 1  Easily annoyed or irritable 1 2  Afraid - awful might happen 0 1  Total GAD 7 Score 5 -  Anxiety Difficulty Somewhat difficult Somewhat difficult   COUGH Duration: weeks Circumstances of initial development of cough: AC fan blowing in face Cough severity: mild Cough description: productive in morning, dry rest of day Aggravating factors:  Worse in the am Alleviating factors: Mucinex Status:  stable Treatments attempted: Mucinex Wheezing: no Shortness of breath: yes Chest pain: no Chest tightness:no Nasal congestion: no Runny nose: no Postnasal drip: no Frequent throat clearing or swallowing: yes Hemoptysis: no Fevers: no Night sweats: no Weight loss: no Heartburn: no Recent foreign travel: no Tuberculosis contacts: no  No Known Allergies  Outpatient Encounter Medications as of 06/23/2020  Medication Sig  . isosorbide mononitrate (IMDUR) 30 MG 24 hr tablet Take by mouth.  Marland Kitchen aspirin 81 MG EC tablet Take 1 tablet by mouth daily.  . citalopram (CELEXA) 40 MG tablet Take 1 tablet (40 mg total) by mouth daily.  . clopidogrel (PLAVIX) 75 MG tablet Take 1 tablet (75 mg total) by mouth daily.  . famotidine (PEPCID) 20 MG tablet Take 1 tablet  (20 mg total) by mouth 2 (two) times daily.  . fluticasone (FLONASE) 50 MCG/ACT nasal spray Place 2 sprays into both nostrils daily.  . furosemide (LASIX) 40 MG tablet Take 40 mg by mouth daily.  Marland Kitchen gabapentin (NEURONTIN) 300 MG capsule Take 1-2 capsules (300-600 mg total) by mouth at bedtime as needed.  Marland Kitchen losartan (COZAAR) 50 MG tablet Take 1 tablet (50 mg total) by mouth daily.  . metFORMIN (GLUCOPHAGE) 1000 MG tablet Take 1 tablet (1,000 mg total) by mouth 2 (two) times daily with a meal.  . metoprolol succinate (TOPROL-XL) 50 MG 24 hr tablet Take 50 mg by mouth daily.  Marland Kitchen oxyCODONE (ROXICODONE) 5 MG immediate release tablet Take 1 tablet (5 mg total) by mouth every 6 (six) hours as needed for moderate pain or severe pain. (Patient not taking: Reported on 05/05/2020)  . pantoprazole (PROTONIX) 40 MG tablet Take 1 tablet (40 mg total) by mouth daily.  . pravastatin (PRAVACHOL) 40 MG tablet Take 0.5 tablets (20 mg total) by mouth every evening.  . traZODone (DESYREL) 50 MG tablet Take 1 tablet (50 mg total) by mouth at bedtime. PRN  . [DISCONTINUED] citalopram (CELEXA) 40 MG tablet Take 1 tablet (40 mg total) by mouth daily.  . [DISCONTINUED] clopidogrel (PLAVIX) 75 MG tablet Take 1 tablet (75 mg total) by mouth daily.  . [DISCONTINUED] famotidine (PEPCID) 20 MG tablet Take 1 tablet (20 mg total) by mouth 2 (two) times daily.  . [DISCONTINUED] gabapentin (NEURONTIN) 300 MG capsule Take 1-2  capsules (300-600 mg total) by mouth at bedtime as needed.  . [DISCONTINUED] losartan (COZAAR) 50 MG tablet Take 1 tablet (50 mg total) by mouth daily.  . [DISCONTINUED] metFORMIN (GLUCOPHAGE) 1000 MG tablet Take 1 tablet (1,000 mg total) by mouth 2 (two) times daily with a meal.  . [DISCONTINUED] metoprolol tartrate (LOPRESSOR) 100 MG tablet   . [DISCONTINUED] pantoprazole (PROTONIX) 40 MG tablet Take 1 tablet (40 mg total) by mouth daily.  . [DISCONTINUED] pravastatin (PRAVACHOL) 40 MG tablet Take 0.5 tablets  (20 mg total) by mouth every evening.  . [DISCONTINUED] traZODone (DESYREL) 50 MG tablet Take 1 tablet (50 mg total) by mouth at bedtime. PRN  . [DISCONTINUED] sodium chloride flush (NS) 0.9 % injection 3 mL    No facility-administered encounter medications on file as of 06/23/2020.   Patient Active Problem List   Diagnosis Date Noted  . History of tobacco use 06/23/2020  . Cough 06/23/2020  . Unstable angina ([place]) 05/19/2020  . Lipoma of head 03/31/2020  . Mild episode of recurrent major depressive disorder ([place]) 01/05/2016  . Chronic systolic heart failure ([place]) 01/13/2015  . Coronary artery disease 01/13/2015  . Essential hypertension 01/13/2015  . Left ventricular dysfunction 01/13/2015  . Type 2 diabetes mellitus without complication, without long-term current use of insulin ([place]) 01/13/2015  . Anxiety disorder 12/20/2014  . GERD (gastroesophageal reflux disease) 12/20/2014  . Mixed hyperlipidemia 12/20/2014  . Non morbid obesity due to excess calories 12/20/2014   Past Medical History:  Diagnosis Date  . Abnormal EKG   . Anxiety disorder   . CAD (coronary artery disease)   . CHF (congestive heart failure) ([place])   . Chronic systolic heart failure ([place])   . Depression   . Diabetes mellitus ([place])   . GERD (gastroesophageal reflux disease)   . Hypertension   . Lipoma of head 03/31/2020  . Mixed hyperlipidemia   . Non morbid obesity due to excess calories   . Type 2 diabetes mellitus without complication, without long-term current use of insulin (HCC)    Relevant past medical, surgical, family and social history reviewed and updated as indicated. Interim medical history since our last visit reviewed.  Review of Systems  Constitutional: Negative.  Negative for activity change, appetite change, fatigue and fever.  HENT: Positive for congestion, rhinorrhea and sore throat. Negative for ear pain, postnasal drip, sinus pressure, sinus pain and sneezing.   Eyes: Negative.   Negative for visual disturbance.  Respiratory: Positive for cough and shortness of breath. Negative for chest tightness and wheezing.   Cardiovascular: Negative.  Negative for chest pain and leg swelling.  Gastrointestinal: Negative.  Negative for constipation, diarrhea, nausea and vomiting.  Musculoskeletal: Negative.   Skin: Negative.   Neurological: Negative.  Negative for dizziness, weakness and headaches.  Psychiatric/Behavioral: Negative.  Negative for sleep disturbance. The patient is not nervous/anxious.    Per HPI unless specifically indicated above     Objective:    BP 109/67   Pulse 65   Temp 98.8 F (37.1 C)   Ht 5' 9.96" (1.777 m)   Wt 269 lb 2 oz (122.1 kg)   SpO2 96%   BMI 38.66 kg/m   Wt Readings from Last 3 Encounters:  06/23/20 269 lb 2 oz (122.1 kg)  06/02/20 264 lb (119.7 kg)  05/05/20 271 lb 12.8 oz (123.3 kg)    Physical Exam Vitals and nursing note reviewed.  Constitutional:      Appearance: Normal appearance. He is obese.  HENT:     Head: Normocephalic and atraumatic.     Nose: Congestion present.     Mouth/Throat:     Mouth: Mucous membranes are moist.     Pharynx: Oropharynx is clear.  Eyes:     General: No scleral icterus.    Extraocular Movements: Extraocular movements intact.  Cardiovascular:     Rate and Rhythm: Normal rate and regular rhythm.     Heart sounds: Normal heart sounds.  Pulmonary:     Effort: Pulmonary effort is normal. No respiratory distress.     Breath sounds: Normal breath sounds. No wheezing or rhonchi.  Abdominal:     General: Abdomen is flat. Bowel sounds are normal. There is no distension.     Palpations: Abdomen is soft.     Tenderness: There is no abdominal tenderness.  Musculoskeletal:        General: Normal range of motion.     Right lower leg: No edema.     Left lower leg: No edema.  Skin:    General: Skin is warm and dry.     Coloration: Skin is not jaundiced or pale.  Neurological:     General: No  focal deficit present.     Mental Status: He is alert and oriented to person, place, and time.     Motor: No weakness.     Gait: Gait normal.  Psychiatric:        Mood and Affect: Mood normal.        Behavior: Behavior normal.        Thought Content: Thought content normal.        Judgment: Judgment normal.       Assessment & Plan:   Problem List Items Addressed This Visit      Cardiovascular and Mediastinum   Coronary artery disease    Chronic, ongoing.  Most recent heart cath in June 2021 - following with [facility] Cardiology Dr. Michiel Cowboy.  Continue to follow with Cardiology for cardiac symptoms.      Relevant Medications   isosorbide mononitrate (IMDUR) 30 MG 24 hr tablet   metoprolol succinate (TOPROL-XL) 50 MG 24 hr tablet   clopidogrel (PLAVIX) 75 MG tablet   losartan (COZAAR) 50 MG tablet   pravastatin (PRAVACHOL) 40 MG tablet   Essential hypertension    Chronic, stable on Losartan 50 mg daily.  Continue regimen for now.  Will check labs at upcoming physical appointment.      Relevant Medications   isosorbide mononitrate (IMDUR) 30 MG 24 hr tablet   metoprolol succinate (TOPROL-XL) 50 MG 24 hr tablet   losartan (COZAAR) 50 MG tablet   pravastatin (PRAVACHOL) 40 MG tablet     Digestive   GERD (gastroesophageal reflux disease)    Chronic, ongoing.  Stable on pepcid 20 mg bid and pantoprazole 40 mg daily.  Continue for now; will discuss long-term effects and check labs at upcoming physical.      Relevant Medications   famotidine (PEPCID) 20 MG tablet   pantoprazole (PROTONIX) 40 MG tablet     Endocrine   Type 2 diabetes mellitus without complication, without long-term current use of insulin (HCC)    Chronic, stable per patient.  Will check A1c, urine microalbumin, CMP at upcoming physical.  Continue metformin 1,000 mg twice daily for now.  Continue with increasing physical activity and diet.      Relevant Medications   gabapentin (NEURONTIN) 300 MG capsule   losartan  (COZAAR) 50 MG tablet  metFORMIN (GLUCOPHAGE) 1000 MG tablet   pravastatin (PRAVACHOL) 40 MG tablet     Other   Anxiety disorder    Chronic, ongoing.  GAD-7 mildly elevated, patient reports he is out of his citalopram.  Refill sent; patient advised to call or return to clinic if anxiety not improved after restarting medication.      Relevant Medications   citalopram (CELEXA) 40 MG tablet   traZODone (DESYREL) 50 MG tablet   Mild episode of recurrent major depressive disorder (HCC)    Chronic, stable.  PHQ-9 elevated today, will refill citalopram and restart.  Patient to return to clinic if symptoms persist.      Relevant Medications   citalopram (CELEXA) 40 MG tablet   traZODone (DESYREL) 50 MG tablet   Mixed hyperlipidemia    Chronic, stable.  Will refill pravastatin and continue for now.  Will recheck lipids at upcoming physical.      Relevant Medications   isosorbide mononitrate (IMDUR) 30 MG 24 hr tablet   metoprolol succinate (TOPROL-XL) 50 MG 24 hr tablet   losartan (COZAAR) 50 MG tablet   pravastatin (PRAVACHOL) 40 MG tablet   History of tobacco use    Reports 28 pack year history - will discuss preventative screening at upcoming physical.      Cough    Ongoing x 2 weeks, productive in the morning.  No fever or other upper respiratory symptoms.  Possible allergic component although early COPD remains on differential for now.  If not better with flonase, may consider spirometry at next appointment, especially with smoking history.       Other Visit Diagnoses    Screening for colon cancer    -  Primary   Relevant Orders   Ambulatory referral to Gastroenterology       Follow up plan: Return in about 1 month (around 07/24/2020) for physical with fasting labs.

## 2020-06-23 NOTE — Assessment & Plan Note (Signed)
Chronic, ongoing.  GAD-7 mildly elevated, patient reports he is out of his citalopram.  Refill sent; patient advised to call or return to clinic if anxiety not improved after restarting medication.

## 2020-06-23 NOTE — Assessment & Plan Note (Signed)
Ongoing x 2 weeks, productive in the morning.  No fever or other upper respiratory symptoms.  Possible allergic component although early COPD remains on differential for now.  If not better with flonase, may consider spirometry at next appointment, especially with smoking history.

## 2020-06-23 NOTE — Assessment & Plan Note (Addendum)
Chronic, stable on Losartan 50 mg daily.  Continue regimen for now.  Will check labs at upcoming physical appointment.

## 2020-06-23 NOTE — Assessment & Plan Note (Signed)
Chronic, stable per patient.  Will check A1c, urine microalbumin, CMP at upcoming physical.  Continue metformin 1,000 mg twice daily for now.  Continue with increasing physical activity and diet.

## 2020-06-23 NOTE — Assessment & Plan Note (Signed)
Reports 28 pack year history - will discuss preventative screening at upcoming physical.

## 2020-06-23 NOTE — Patient Instructions (Signed)

## 2020-06-23 NOTE — Assessment & Plan Note (Addendum)
Chronic, ongoing.  Most recent heart cath in June 2021 - following with [facility] Cardiology Dr. Michiel Cowboy.  Continue to follow with Cardiology for cardiac symptoms.

## 2020-06-23 NOTE — Assessment & Plan Note (Addendum)
Chronic, ongoing.  Stable on pepcid 20 mg bid and pantoprazole 40 mg daily.  Continue for now; will discuss long-term effects and check labs at upcoming physical.

## 2020-07-04 ENCOUNTER — Telehealth: Payer: 59

## 2020-07-28 ENCOUNTER — Encounter: Payer: 59 | Admitting: Nurse Practitioner

## 2020-09-15 ENCOUNTER — Other Ambulatory Visit: Payer: Self-pay

## 2020-09-15 DIAGNOSIS — I10 Essential (primary) hypertension: Secondary | ICD-10-CM

## 2020-09-15 MED ORDER — LOSARTAN POTASSIUM 50 MG PO TABS: 50.0000 mg | ORAL_TABLET | Freq: Every day | ORAL | 1 refills | Status: DC

## 2020-09-25 ENCOUNTER — Other Ambulatory Visit: Payer: Self-pay

## 2020-09-25 DIAGNOSIS — K219 Gastro-esophageal reflux disease without esophagitis: Secondary | ICD-10-CM

## 2020-09-25 MED ORDER — FAMOTIDINE 20 MG PO TABS: 20.0000 mg | ORAL_TABLET | Freq: Two times a day (BID) | ORAL | 0 refills | Status: DC

## 2020-09-25 MED ORDER — PANTOPRAZOLE SODIUM 40 MG PO TBEC: 40.0000 mg | DELAYED_RELEASE_TABLET | Freq: Every day | ORAL | 0 refills | Status: DC

## 2020-09-25 NOTE — Telephone Encounter (Signed)
Patient last seen 06/23/20

## 2020-09-27 ENCOUNTER — Inpatient Hospital Stay
Admission: EM | Admit: 2020-09-27 | Discharge: 2020-09-30 | DRG: 177 | Disposition: A | Discharge status: Home / Self Care | Payer: 59 | Attending: Family Medicine | Admitting: Family Medicine

## 2020-09-27 ENCOUNTER — Emergency Department: Payer: 59

## 2020-09-27 ENCOUNTER — Other Ambulatory Visit: Payer: Self-pay

## 2020-09-27 ENCOUNTER — Encounter: Payer: Self-pay | Admitting: Intensive Care

## 2020-09-27 DIAGNOSIS — E871 Hypo-osmolality and hyponatremia: Secondary | ICD-10-CM | POA: Diagnosis present

## 2020-09-27 DIAGNOSIS — U071 COVID-19: Secondary | ICD-10-CM | POA: Diagnosis present

## 2020-09-27 DIAGNOSIS — I255 Ischemic cardiomyopathy: Secondary | ICD-10-CM | POA: Diagnosis present

## 2020-09-27 DIAGNOSIS — A0839 Other viral enteritis: Secondary | ICD-10-CM | POA: Diagnosis present

## 2020-09-27 DIAGNOSIS — J1282 Pneumonia due to coronavirus disease 2019: Secondary | ICD-10-CM | POA: Diagnosis present

## 2020-09-27 DIAGNOSIS — Z79899 Other long term (current) drug therapy: Secondary | ICD-10-CM

## 2020-09-27 DIAGNOSIS — E1140 Type 2 diabetes mellitus with diabetic neuropathy, unspecified: Secondary | ICD-10-CM

## 2020-09-27 DIAGNOSIS — F32A Depression, unspecified: Secondary | ICD-10-CM | POA: Diagnosis present

## 2020-09-27 DIAGNOSIS — Z7982 Long term (current) use of aspirin: Secondary | ICD-10-CM

## 2020-09-27 DIAGNOSIS — I959 Hypotension, unspecified: Secondary | ICD-10-CM | POA: Diagnosis present

## 2020-09-27 DIAGNOSIS — N179 Acute kidney failure, unspecified: Secondary | ICD-10-CM | POA: Diagnosis present

## 2020-09-27 DIAGNOSIS — F419 Anxiety disorder, unspecified: Secondary | ICD-10-CM | POA: Diagnosis present

## 2020-09-27 DIAGNOSIS — F411 Generalized anxiety disorder: Secondary | ICD-10-CM | POA: Diagnosis not present

## 2020-09-27 DIAGNOSIS — I2510 Atherosclerotic heart disease of native coronary artery without angina pectoris: Secondary | ICD-10-CM | POA: Diagnosis present

## 2020-09-27 DIAGNOSIS — I10 Essential (primary) hypertension: Secondary | ICD-10-CM | POA: Diagnosis present

## 2020-09-27 DIAGNOSIS — Z833 Family history of diabetes mellitus: Secondary | ICD-10-CM

## 2020-09-27 DIAGNOSIS — Z87891 Personal history of nicotine dependence: Secondary | ICD-10-CM | POA: Diagnosis not present

## 2020-09-27 DIAGNOSIS — Z7984 Long term (current) use of oral hypoglycemic drugs: Secondary | ICD-10-CM

## 2020-09-27 DIAGNOSIS — Z7902 Long term (current) use of antithrombotics/antiplatelets: Secondary | ICD-10-CM

## 2020-09-27 DIAGNOSIS — E861 Hypovolemia: Secondary | ICD-10-CM | POA: Diagnosis present

## 2020-09-27 DIAGNOSIS — I110 Hypertensive heart disease with heart failure: Secondary | ICD-10-CM | POA: Diagnosis present

## 2020-09-27 DIAGNOSIS — E782 Mixed hyperlipidemia: Secondary | ICD-10-CM | POA: Diagnosis present

## 2020-09-27 DIAGNOSIS — E669 Obesity, unspecified: Secondary | ICD-10-CM | POA: Diagnosis present

## 2020-09-27 DIAGNOSIS — E860 Dehydration: Secondary | ICD-10-CM | POA: Diagnosis present

## 2020-09-27 DIAGNOSIS — J9601 Acute respiratory failure with hypoxia: Secondary | ICD-10-CM | POA: Diagnosis present

## 2020-09-27 DIAGNOSIS — K529 Noninfective gastroenteritis and colitis, unspecified: Secondary | ICD-10-CM

## 2020-09-27 DIAGNOSIS — K219 Gastro-esophageal reflux disease without esophagitis: Secondary | ICD-10-CM | POA: Diagnosis present

## 2020-09-27 DIAGNOSIS — Z6833 Body mass index (BMI) 33.0-33.9, adult: Secondary | ICD-10-CM

## 2020-09-27 DIAGNOSIS — Z8249 Family history of ischemic heart disease and other diseases of the circulatory system: Secondary | ICD-10-CM

## 2020-09-27 DIAGNOSIS — I5022 Chronic systolic (congestive) heart failure: Secondary | ICD-10-CM | POA: Diagnosis present

## 2020-09-27 DIAGNOSIS — E119 Type 2 diabetes mellitus without complications: Secondary | ICD-10-CM | POA: Diagnosis present

## 2020-09-27 DIAGNOSIS — E1159 Type 2 diabetes mellitus with other circulatory complications: Secondary | ICD-10-CM | POA: Diagnosis present

## 2020-09-27 DIAGNOSIS — N170 Acute kidney failure with tubular necrosis: Secondary | ICD-10-CM | POA: Diagnosis present

## 2020-09-27 DIAGNOSIS — Z955 Presence of coronary angioplasty implant and graft: Secondary | ICD-10-CM

## 2020-09-27 LAB — LACTIC ACID, PLASMA
Lactic Acid, Venous: 1.6 mmol/L (ref 0.5–1.9)
Lactic Acid, Venous: 1.1 mmol/L (ref 0.5–1.9)

## 2020-09-27 LAB — TROPONIN I (HIGH SENSITIVITY)
Troponin I (High Sensitivity): 13 ng/L (ref <18)
Troponin I (High Sensitivity): 15 ng/L (ref <18)

## 2020-09-27 LAB — GLUCOSE, CAPILLARY
Glucose-Capillary: 126 mg/dL — ABNORMAL HIGH (ref 70–99)
Glucose-Capillary: 107 mg/dL — ABNORMAL HIGH (ref 70–99)

## 2020-09-27 LAB — APTT: aPTT: 36 seconds (ref 24–36)

## 2020-09-27 LAB — CBC WITH DIFFERENTIAL/PLATELET
Abs Immature Granulocytes: 0.06 10*3/uL (ref 0.00–0.07)
Basophils Absolute: 0 10*3/uL (ref 0.0–0.1)
Basophils Relative: 0 %
Eosinophils Absolute: 0 10*3/uL (ref 0.0–0.5)
Eosinophils Relative: 0 %
HCT: 40.2 % (ref 39.0–52.0)
Hemoglobin: 13.2 g/dL (ref 13.0–17.0)
Immature Granulocytes: 1 %
Lymphocytes Relative: 11 %
Lymphs Abs: 1 10*3/uL (ref 0.7–4.0)
MCH: 25.8 pg — ABNORMAL LOW (ref 26.0–34.0)
MCHC: 32.8 g/dL (ref 30.0–36.0)
MCV: 78.5 fL — ABNORMAL LOW (ref 80.0–100.0)
Monocytes Absolute: 0.5 10*3/uL (ref 0.1–1.0)
Monocytes Relative: 6 %
Neutro Abs: 7.7 10*3/uL (ref 1.7–7.7)
Neutrophils Relative %: 82 %
Platelets: 137 10*3/uL — ABNORMAL LOW (ref 150–400)
RBC: 5.12 MIL/uL (ref 4.22–5.81)
RDW: 13.5 % (ref 11.5–15.5)
WBC: 9.3 10*3/uL (ref 4.0–10.5)
nRBC: 0 % (ref 0.0–0.2)

## 2020-09-27 LAB — COMPREHENSIVE METABOLIC PANEL
ALT: 16 U/L (ref 0–44)
AST: 35 U/L (ref 15–41)
Albumin: 3.7 g/dL (ref 3.5–5.0)
Alkaline Phosphatase: 49 U/L (ref 38–126)
Anion gap: 13 (ref 5–15)
BUN: 25 mg/dL — ABNORMAL HIGH (ref 8–23)
CO2: 22 mmol/L (ref 22–32)
Calcium: 8.7 mg/dL — ABNORMAL LOW (ref 8.9–10.3)
Chloride: 98 mmol/L (ref 98–111)
Creatinine, Ser: 2 mg/dL — ABNORMAL HIGH (ref 0.61–1.24)
GFR, Estimated: 34 mL/min — ABNORMAL LOW (ref >60)
Glucose, Bld: 133 mg/dL — ABNORMAL HIGH (ref 70–99)
Potassium: 4.3 mmol/L (ref 3.5–5.1)
Sodium: 133 mmol/L — ABNORMAL LOW (ref 135–145)
Total Bilirubin: 1.1 mg/dL (ref 0.3–1.2)
Total Protein: 7.7 g/dL (ref 6.5–8.1)

## 2020-09-27 LAB — HIV ANTIBODY (ROUTINE TESTING W REFLEX): HIV Screen 4th Generation wRfx: NONREACTIVE

## 2020-09-27 LAB — PROCALCITONIN: Procalcitonin: 0.3 ng/mL

## 2020-09-27 LAB — MAGNESIUM: Magnesium: 2 mg/dL (ref 1.7–2.4)

## 2020-09-27 LAB — RESPIRATORY PANEL BY RT PCR (FLU A&B, COVID)
Influenza A by PCR: NEGATIVE
Influenza B by PCR: NEGATIVE
SARS Coronavirus 2 by RT PCR: POSITIVE — AB

## 2020-09-27 LAB — PROTIME-INR
INR: 1.1 (ref 0.8–1.2)
Prothrombin Time: 13.5 seconds (ref 11.4–15.2)

## 2020-09-27 LAB — BRAIN NATRIURETIC PEPTIDE: B Natriuretic Peptide: 69.6 pg/mL (ref 0.0–100.0)

## 2020-09-27 MED ORDER — INSULIN ASPART 100 UNIT/ML ~~LOC~~ SOLN
0.0000 [IU] | Freq: Three times a day (TID) | SUBCUTANEOUS | Status: DC
  Administered 2020-09-27: 17:00:00 2 [IU] via SUBCUTANEOUS
  Administered 2020-09-29: 18:00:00 4 [IU] via SUBCUTANEOUS
  Administered 2020-09-30: 5 [IU] via SUBCUTANEOUS
  Filled 2020-09-27 (×3): qty 1

## 2020-09-27 MED ORDER — PRAVASTATIN SODIUM 20 MG PO TABS
20.0000 mg | ORAL_TABLET | Freq: Every evening | ORAL | Status: DC
  Administered 2020-09-27 – 2020-09-29 (×3): 20 mg via ORAL
  Filled 2020-09-27 (×3): qty 1

## 2020-09-27 MED ORDER — ENOXAPARIN SODIUM 40 MG/0.4ML ~~LOC~~ SOLN
40.0000 mg | SUBCUTANEOUS | Status: DC
  Administered 2020-09-27: 22:00:00 40 mg via SUBCUTANEOUS
  Filled 2020-09-27: qty 0.4

## 2020-09-27 MED ORDER — GABAPENTIN 300 MG PO CAPS: 600.0000 mg | ORAL_CAPSULE | Freq: Every evening | ORAL | Status: DC | PRN

## 2020-09-27 MED ORDER — SODIUM CHLORIDE 0.9 % IV SOLN: 100.0000 mg | Freq: Every day | INTRAVENOUS | Status: DC

## 2020-09-27 MED ORDER — ONDANSETRON HCL 4 MG PO TABS: 4.0000 mg | ORAL_TABLET | Freq: Four times a day (QID) | ORAL | Status: DC | PRN

## 2020-09-27 MED ORDER — PANTOPRAZOLE SODIUM 40 MG PO TBEC
40.0000 mg | DELAYED_RELEASE_TABLET | Freq: Every day | ORAL | Status: DC
  Administered 2020-09-27 – 2020-09-30 (×4): 40 mg via ORAL
  Filled 2020-09-27 (×4): qty 1

## 2020-09-27 MED ORDER — CLOPIDOGREL BISULFATE 75 MG PO TABS
75.0000 mg | ORAL_TABLET | Freq: Every day | ORAL | Status: DC
  Administered 2020-09-27 – 2020-09-30 (×4): 75 mg via ORAL
  Filled 2020-09-27 (×4): qty 1

## 2020-09-27 MED ORDER — TRAZODONE HCL 50 MG PO TABS
50.0000 mg | ORAL_TABLET | Freq: Every evening | ORAL | Status: DC | PRN
  Administered 2020-09-27 – 2020-09-29 (×3): 50 mg via ORAL
  Filled 2020-09-27 (×3): qty 1

## 2020-09-27 MED ORDER — FLUTICASONE PROPIONATE 50 MCG/ACT NA SUSP
2.0000 | Freq: Every day | NASAL | Status: DC
  Administered 2020-09-28 – 2020-09-30 (×3): 2 via NASAL
  Filled 2020-09-27 (×2): qty 16

## 2020-09-27 MED ORDER — OXYCODONE HCL 5 MG PO TABS
5.0000 mg | ORAL_TABLET | Freq: Four times a day (QID) | ORAL | Status: DC | PRN
  Administered 2020-09-28 – 2020-09-29 (×4): 5 mg via ORAL
  Filled 2020-09-27 (×4): qty 1

## 2020-09-27 MED ORDER — ONDANSETRON HCL 4 MG/2ML IJ SOLN: 4.0000 mg | Freq: Four times a day (QID) | INTRAMUSCULAR | Status: DC | PRN

## 2020-09-27 MED ORDER — SODIUM CHLORIDE 0.9% FLUSH: 3.0000 mL | INTRAVENOUS | Status: DC | PRN

## 2020-09-27 MED ORDER — ACETAMINOPHEN 500 MG PO TABS
1000.0000 mg | ORAL_TABLET | Freq: Once | ORAL | Status: AC
  Administered 2020-09-27: 1000 mg via ORAL
  Filled 2020-09-27: qty 2

## 2020-09-27 MED ORDER — SODIUM CHLORIDE 0.9 % IV SOLN: 200.0000 mg | Freq: Once | INTRAVENOUS | Status: DC

## 2020-09-27 MED ORDER — ACETAMINOPHEN 325 MG PO TABS
650.0000 mg | ORAL_TABLET | Freq: Four times a day (QID) | ORAL | Status: DC | PRN
  Administered 2020-09-28: 01:00:00 650 mg via ORAL
  Filled 2020-09-27: qty 2

## 2020-09-27 MED ORDER — SODIUM CHLORIDE 0.9 % IV SOLN
250.0000 mL | INTRAVENOUS | Status: DC | PRN
  Administered 2020-09-29: 09:00:00 250 mL via INTRAVENOUS

## 2020-09-27 MED ORDER — ASPIRIN EC 81 MG PO TBEC
81.0000 mg | DELAYED_RELEASE_TABLET | Freq: Every day | ORAL | Status: DC
  Administered 2020-09-27 – 2020-09-30 (×4): 81 mg via ORAL
  Filled 2020-09-27 (×4): qty 1

## 2020-09-27 MED ORDER — SODIUM CHLORIDE 0.9 % IV SOLN
100.0000 mg | Freq: Every day | INTRAVENOUS | Status: DC
  Administered 2020-09-28 – 2020-09-30 (×3): 100 mg via INTRAVENOUS
  Filled 2020-09-27 (×3): qty 20

## 2020-09-27 MED ORDER — LACTATED RINGERS IV BOLUS
1000.0000 mL | Freq: Once | INTRAVENOUS | Status: AC
  Administered 2020-09-27: 1000 mL via INTRAVENOUS

## 2020-09-27 MED ORDER — INSULIN DETEMIR 100 UNIT/ML ~~LOC~~ SOLN
0.1000 [IU]/kg | Freq: Two times a day (BID) | SUBCUTANEOUS | Status: DC
  Filled 2020-09-27 (×4): qty 0.11

## 2020-09-27 MED ORDER — SODIUM CHLORIDE 0.9% FLUSH
3.0000 mL | Freq: Two times a day (BID) | INTRAVENOUS | Status: DC
  Administered 2020-09-27 – 2020-09-30 (×7): 3 mL via INTRAVENOUS

## 2020-09-27 MED ORDER — LACTATED RINGERS IV BOLUS (SEPSIS)
1000.0000 mL | Freq: Once | INTRAVENOUS | Status: AC
  Administered 2020-09-27: 1000 mL via INTRAVENOUS

## 2020-09-27 MED ORDER — IOHEXOL 350 MG/ML SOLN
75.0000 mL | Freq: Once | INTRAVENOUS | Status: AC | PRN
  Administered 2020-09-27: 75 mL via INTRAVENOUS

## 2020-09-27 MED ORDER — GUAIFENESIN-DM 100-10 MG/5ML PO SYRP
5.0000 mL | ORAL_SOLUTION | ORAL | Status: DC | PRN
  Administered 2020-09-27 – 2020-09-29 (×3): 5 mL via ORAL
  Filled 2020-09-27 (×3): qty 5

## 2020-09-27 MED ORDER — CITALOPRAM HYDROBROMIDE 20 MG PO TABS
40.0000 mg | ORAL_TABLET | Freq: Every day | ORAL | Status: DC
  Administered 2020-09-27 – 2020-09-30 (×4): 40 mg via ORAL
  Filled 2020-09-27 (×4): qty 2

## 2020-09-27 MED ORDER — SODIUM CHLORIDE 0.9 % IV SOLN
200.0000 mg | Freq: Once | INTRAVENOUS | Status: AC
  Administered 2020-09-27: 16:00:00 200 mg via INTRAVENOUS
  Filled 2020-09-27: qty 200

## 2020-09-27 NOTE — ED Triage Notes (Signed)
Pt comes with c/o COVID and SOB. Pt dx 4 days ago. Pt talking in complete sentences and doesn't appear to be in distress

## 2020-09-27 NOTE — H&P (Signed)
History and Physical    Steven Vang FIE:[id number] DOB: 28-Jul-1956 DOA: 09/27/2020  PCP: Eulogio Bear, NP   Patient coming from: Home  I have personally briefly reviewed patient's old medical records in [place]  Chief Complaint: Shortness of breath                                Diarrhea  HPI: Steven Vang is a 64 y.o. male with medical history significant for coronary artery disease, diabetes mellitus, hypertension, obesity, depression and anxiety who presents to the emergency room for evaluation of shortness of breath, myalgias, cough, fever, fatigue and diarrhea for about 4 days. Patient is unvaccinated. He had a positive PCR test done at [facility] on 09/22/20. He denies having any chest pain or any urinary symptoms.  Patient was noted to be hypotensive upon arrival to the ER and received a liter of IV fluid with improvement in his blood pressure. Venous blood gas showed 7.40/43/26 Sodium shows 133, potassium 4.3, chloride 98, bicarb 22, BUN 25 compared to baseline of 15, creatinine 2.0 compared to baseline of 1.3, calcium 8.7, magnesium 2.0, alkaline phosphatase 49, albumin 3.7, AST 35, ALT 16, total protein 7.7, BNP 69, lactic acid 1.6, white count 9.3, hemoglobin 13.2, hematocrit 40.2, MCV 78.5, RDW 13.5, platelet count 137 Chest x-ray reviewed by me shows no evidence of acute cardiopulmonary disease Twelve-lead EKG reviewed by me shows sinus rhythm with low voltage QRS.    ED Course: Patient is a 64 year old Caucasian male with multiple medical problems who tested positive for the COVID-19 virus on 09/22/20.  He is unvaccinated. He presents to the emergency room for evaluation of diarrhea, poor oral intake, fever, myalgias and shortness of breath.  He was hypotensive with systolic blood pressure in the 60s upon arrival to the ER and responded to IV fluid hydration with improvement in his blood pressure. Labs show acute kidney injury.  Patient will be admitted to the  hospital for further evaluation.   Review of Systems: As per HPI otherwise 10 point review of systems negative.    Past Medical History:  Diagnosis Date  . Abnormal EKG   . Anxiety disorder   . CAD (coronary artery disease)   . CHF (congestive heart failure) ([place])   . Chronic systolic heart failure ([place])   . Depression   . Diabetes mellitus ([place])   . GERD (gastroesophageal reflux disease)   . Hypertension   . Lipoma of head 03/31/2020  . Mixed hyperlipidemia   . Non morbid obesity due to excess calories   . Type 2 diabetes mellitus without complication, without long-term current use of insulin [facility])     Past Surgical History:  Procedure Laterality Date  . ANKLE FRACTURE SURGERY Right   . CARDIAC CATHETERIZATION    . CHOLECYSTECTOMY    . CORONARY STENT PLACEMENT  2015   3 stents  . HAND SURGERY Right   . LEFT HEART CATH AND CORONARY ANGIOGRAPHY Left 06/02/2020   Procedure: LEFT HEART CATH AND CORONARY ANGIOGRAPHY;  Surgeon: Dionisio David, MD;  Location: [facility] [place];  Service: Cardiovascular;  Laterality: Left;     reports that he quit smoking about 31 years ago. His smoking use included cigarettes. He has never used smokeless tobacco. He reports previous alcohol use. He reports that he does not use drugs.  No Known Allergies  Family History  Problem Relation Age of Onset  .  CAD Mother   . Diabetes Mother   . Colon cancer Father   . Breast cancer Paternal Grandmother   . Stomach cancer Paternal Grandfather      Prior to Admission medications   Medication Sig Start Date End Date Taking? Authorizing Provider  aspirin 81 MG EC tablet Take 1 tablet by mouth daily.   Yes [provider]  citalopram (CELEXA) 40 MG tablet Take 1 tablet (40 mg total) by mouth daily. 06/23/20  Yes Eulogio Bear, NP  clopidogrel (PLAVIX) 75 MG tablet Take 1 tablet (75 mg total) by mouth daily. 06/23/20  Yes Eulogio Bear, NP  famotidine (PEPCID) 20 MG tablet Take 1  tablet (20 mg total) by mouth 2 (two) times daily. 09/25/20  Yes Noemi Chapel A, NP  furosemide (LASIX) 40 MG tablet Take 40 mg by mouth daily. 01/14/20  Yes [provider]  gabapentin (NEURONTIN) 300 MG capsule Take 1-2 capsules (300-600 mg total) by mouth at bedtime as needed. Patient taking differently: Take 600 mg by mouth at bedtime as needed.  06/23/20  Yes Noemi Chapel A, NP  isosorbide mononitrate (IMDUR) 30 MG 24 hr tablet Take 30 mg by mouth daily.  06/16/20 09/27/20 Yes [provider]  losartan (COZAAR) 50 MG tablet Take 1 tablet (50 mg total) by mouth daily. 09/15/20  Yes Eulogio Bear, NP  metFORMIN (GLUCOPHAGE) 1000 MG tablet Take 1 tablet (1,000 mg total) by mouth 2 (two) times daily with a meal. 06/23/20  Yes Noemi Chapel A, NP  metoprolol succinate (TOPROL-XL) 50 MG 24 hr tablet Take 50 mg by mouth daily. 06/08/20  Yes [provider]  pantoprazole (PROTONIX) 40 MG tablet Take 1 tablet (40 mg total) by mouth daily. 09/25/20  Yes Eulogio Bear, NP  pravastatin (PRAVACHOL) 40 MG tablet Take 0.5 tablets (20 mg total) by mouth every evening. 06/23/20  Yes Eulogio Bear, NP  traZODone (DESYREL) 50 MG tablet Take 1 tablet (50 mg total) by mouth at bedtime. PRN 06/23/20  Yes Eulogio Bear, NP  fluticasone (FLONASE) 50 MCG/ACT nasal spray Place 2 sprays into both nostrils daily. 06/23/20   Eulogio Bear, NP  oxyCODONE (ROXICODONE) 5 MG immediate release tablet Take 1 tablet (5 mg total) by mouth every 6 (six) hours as needed for moderate pain or severe pain. Patient not taking: Reported on 05/05/2020 04/28/20 04/28/21  Olean Ree, MD    Physical Exam: Vitals:   09/27/20 1030 09/27/20 1031 09/27/20 1040 09/27/20 1130  BP: (!) 66/46  127/73 (!) 118/59  Pulse:   88 81  Resp:   (!) 27 20  Temp:    100.1 F (37.8 C)  TempSrc:    Oral  SpO2:   92% 92%  Weight:  108.9 kg    Height:  5\' 11"  (1.803 m)       Vitals:   09/27/20  1030 09/27/20 1031 09/27/20 1040 09/27/20 1130  BP: (!) 66/46  127/73 (!) 118/59  Pulse:   88 81  Resp:   (!) 27 20  Temp:    100.1 F (37.8 C)  TempSrc:    Oral  SpO2:   92% 92%  Weight:  108.9 kg    Height:  5\' 11"  (1.803 m)      Constitutional: NAD, alert and oriented x 3. Acutely ill appearing Eyes: PERRL, lids and conjunctivae normal ENMT: Mucous membranes are dry.  Neck: normal, supple, no masses, no thyromegaly Respiratory: Bilateral air entry, no wheezing,  no crackles. Normal respiratory effort. No accessory muscle use.  Cardiovascular: Regular rate and rhythm, no murmurs / rubs / gallops. No extremity edema. 2+ pedal pulses. No carotid bruits.  Abdomen: no tenderness, no masses palpated. No hepatosplenomegaly. Bowel sounds positive.  Musculoskeletal: no clubbing / cyanosis. No joint deformity upper and lower extremities. Generalized weakness  Skin: no rashes, lesions, ulcers.  Neurologic: No gross focal neurologic deficit. Psychiatric: Normal mood and affect.   Labs on Admission: I have personally reviewed following labs and imaging studies  CBC: Recent Labs  Lab 09/27/20 1041  WBC 9.3  NEUTROABS 7.7  HGB 13.2  HCT 40.2  MCV 78.5*  PLT 814*   Basic Metabolic Panel: Recent Labs  Lab 09/27/20 1041  NA 133*  K 4.3  CL 98  CO2 22  GLUCOSE 133*  BUN 25*  CREATININE 2.00*  CALCIUM 8.7*  MG 2.0   GFR: Estimated Creatinine Clearance: 47.4 mL/min (A) (by C-G formula based on SCr of 2 mg/dL (H)). Liver Function Tests: Recent Labs  Lab 09/27/20 1041  AST 35  ALT 16  ALKPHOS 49  BILITOT 1.1  PROT 7.7  ALBUMIN 3.7   No results for input(s): LIPASE, AMYLASE in the last 168 hours. No results for input(s): AMMONIA in the last 168 hours. Coagulation Profile: Recent Labs  Lab 09/27/20 1041  INR 1.1   Cardiac Enzymes: No results for input(s): CKTOTAL, CKMB, CKMBINDEX, TROPONINI in the last 168 hours. BNP (last 3 results) No results for input(s):  PROBNP in the last 8760 hours. HbA1C: No results for input(s): HGBA1C in the last 72 hours. CBG: No results for input(s): GLUCAP in the last 168 hours. Lipid Profile: No results for input(s): CHOL, HDL, LDLCALC, TRIG, CHOLHDL, LDLDIRECT in the last 72 hours. Thyroid Function Tests: No results for input(s): TSH, T4TOTAL, FREET4, T3FREE, THYROIDAB in the last 72 hours. Anemia Panel: No results for input(s): VITAMINB12, FOLATE, FERRITIN, TIBC, IRON, RETICCTPCT in the last 72 hours. Urine analysis: No results found for: COLORURINE, APPEARANCEUR, LABSPEC, PHURINE, GLUCOSEU, [place], BILIRUBINUR, KETONESUR, PROTEINUR, UROBILINOGEN, NITRITE, LEUKOCYTESUR  Radiological Exams on Admission: DG Chest Port 1 View  Result Date: 09/27/2020 CLINICAL DATA:  Possible sepsis. COVID positive with shortness of breath, body aches and cough. EXAM: PORTABLE CHEST 1 VIEW COMPARISON:  Chest radiograph 07/01/2014 FINDINGS: Low lung volumes, which limits evaluation. No focal consolidation. Cardiac silhouette is within normal limits and mildly accentuated by low lung volumes and portable technique. No visible pleural effusions pneumothorax. No acute osseous abnormality. IMPRESSION: No evidence of acute cardiopulmonary disease on this AP portable radiograph with low lung volumes. Electronically Signed   By: Margaretha Sheffield MD   On: 09/27/2020 11:02    EKG: Independently reviewed.  Sinus rhythm Low voltage QRS  Assessment/Plan Principal Problem:   AKI (acute kidney injury) ([place]) Active Problems:   Anxiety disorder   Chronic systolic heart failure ([facility])   Coronary artery disease   Essential hypertension   GERD (gastroesophageal reflux disease)   Type 2 diabetes mellitus without complication, without long-term current use of insulin ([place])     AKI Patient presents to the ER for evaluation of weakness, fatigue, poor oral intake and shortness of breath. AKI is most likely secondary to ATN from  hypotension Patient tested positive for the COVID 19 virus on 09/22/20 His baseline serum creatinine is 1.30 and today on admission it is 2.0 Patient has received 2L of IVF with improvement in his blood pressure Hold Furosemide and Cozaar for now  CAD Patient has a history of CAD and is s/p stent angioplasty Continue aspirin, Plavix and statins Hold nitrates and metoprolol for now due to relative hypotension.   Chronic systolic heart failure Secondary to ischemic cardiomyopathy Last known LVEF is about 50%. Hold furosemide, Cozaar and metoprolol due to relative hypotension    Diabetes mellitus Hold oral hypoglycemic agents due to acute kidney injury We will place patient on sliding scale insulin to optimize glycemic control   Depression Continue trazodone and Celexa   GERD Continue PPI    DVT prophylaxis: Lovenox Code Status: Full code Family Communication: Greater than 50% of time was spent discussing patient's condition and plan of care with patient at the bedside.  All questions and concerns have been addressed.  He verbalizes understanding and agrees with the plan. Disposition Plan: Back to previous home environment Consults called: None    Mattias Walmsley MD [facility]     09/27/2020, 1:14 PM

## 2020-09-27 NOTE — ED Provider Notes (Signed)
[facility] Emergency Department Provider Note  ____________________________________________   First MD Initiated Contact with Patient 09/27/20 1037     (approximate)  I have reviewed the triage vital signs and the nursing notes.   HISTORY  Chief Complaint Covid Positive, Shortness of Breath, Cough, and Diarrhea   HPI Steven Vang is a 64 y.o. male with a past medical history of CAD, CHF, DM, HTN, depression, anxiety, and recent Covid diagnosis approximately 5 days ago who presents for assessment of approximately 4 to 5 days of cough, shortness of breath, chest tightness, fatigue, fevers, chills, lightheadedness, dizziness, poor p.o. intake, and nonbloody diarrhea.  Patient has not been vaccinated against Covid.  He denies any vomiting, nausea, rash, extremity pain, recent falls or injuries, abdominal pain, back pain, or other acute complaints.  No personal episodes.  No clear alleviating or aggravating factors.  Endorses remote tobacco abuse but denies any illegal drug use or EtOH abuse.         Past Medical History:  Diagnosis Date  . Abnormal EKG   . Anxiety disorder   . CAD (coronary artery disease)   . CHF (congestive heart failure) ([place])   . Chronic systolic heart failure ([place])   . Depression   . Diabetes mellitus ([place])   . GERD (gastroesophageal reflux disease)   . Hypertension   . Lipoma of head 03/31/2020  . Mixed hyperlipidemia   . Non morbid obesity due to excess calories   . Type 2 diabetes mellitus without complication, without long-term current use of insulin [facility])     Patient Active Problem List   Diagnosis Date Noted  . AKI (acute kidney injury) ([place]) 09/27/2020  . History of tobacco use 06/23/2020  . Cough 06/23/2020  . Unstable angina ([place]) 05/19/2020  . Lipoma of head 03/31/2020  . Mild episode of recurrent major depressive disorder ([place]) 01/05/2016  . Chronic systolic heart failure ([place]) 01/13/2015  . Coronary artery  disease 01/13/2015  . Essential hypertension 01/13/2015  . Left ventricular dysfunction 01/13/2015  . Type 2 diabetes mellitus without complication, without long-term current use of insulin ([place]) 01/13/2015  . Anxiety disorder 12/20/2014  . GERD (gastroesophageal reflux disease) 12/20/2014  . Mixed hyperlipidemia 12/20/2014  . Non morbid obesity due to excess calories 12/20/2014    Past Surgical History:  Procedure Laterality Date  . ANKLE FRACTURE SURGERY Right   . CARDIAC CATHETERIZATION    . CHOLECYSTECTOMY    . CORONARY STENT PLACEMENT  2015   3 stents  . HAND SURGERY Right   . LEFT HEART CATH AND CORONARY ANGIOGRAPHY Left 06/02/2020   Procedure: LEFT HEART CATH AND CORONARY ANGIOGRAPHY;  Surgeon: Dionisio David, MD;  Location: [facility];  Service: Cardiovascular;  Laterality: Left;    Prior to Admission medications   Medication Sig Start Date End Date Taking? Authorizing Provider  aspirin 81 MG EC tablet Take 1 tablet by mouth daily.   Yes [provider]  citalopram (CELEXA) 40 MG tablet Take 1 tablet (40 mg total) by mouth daily. 06/23/20  Yes Eulogio Bear, NP  clopidogrel (PLAVIX) 75 MG tablet Take 1 tablet (75 mg total) by mouth daily. 06/23/20  Yes Eulogio Bear, NP  famotidine (PEPCID) 20 MG tablet Take 1 tablet (20 mg total) by mouth 2 (two) times daily. 09/25/20  Yes Noemi Chapel A, NP  furosemide (LASIX) 40 MG tablet Take 40 mg by mouth daily. 01/14/20  Yes [provider]  gabapentin (  NEURONTIN) 300 MG capsule Take 1-2 capsules (300-600 mg total) by mouth at bedtime as needed. Patient taking differently: Take 600 mg by mouth at bedtime as needed.  06/23/20  Yes Noemi Chapel A, NP  isosorbide mononitrate (IMDUR) 30 MG 24 hr tablet Take 30 mg by mouth daily.  06/16/20 09/27/20 Yes [provider]  losartan (COZAAR) 50 MG tablet Take 1 tablet (50 mg total) by mouth daily. 09/15/20  Yes Eulogio Bear, NP  metFORMIN  (GLUCOPHAGE) 1000 MG tablet Take 1 tablet (1,000 mg total) by mouth 2 (two) times daily with a meal. 06/23/20  Yes Noemi Chapel A, NP  metoprolol succinate (TOPROL-XL) 50 MG 24 hr tablet Take 50 mg by mouth daily. 06/08/20  Yes [provider]  pantoprazole (PROTONIX) 40 MG tablet Take 1 tablet (40 mg total) by mouth daily. 09/25/20  Yes Eulogio Bear, NP  pravastatin (PRAVACHOL) 40 MG tablet Take 0.5 tablets (20 mg total) by mouth every evening. 06/23/20  Yes Eulogio Bear, NP  traZODone (DESYREL) 50 MG tablet Take 1 tablet (50 mg total) by mouth at bedtime. PRN 06/23/20  Yes Eulogio Bear, NP  fluticasone (FLONASE) 50 MCG/ACT nasal spray Place 2 sprays into both nostrils daily. 06/23/20   Eulogio Bear, NP  oxyCODONE (ROXICODONE) 5 MG immediate release tablet Take 1 tablet (5 mg total) by mouth every 6 (six) hours as needed for moderate pain or severe pain. Patient not taking: Reported on 05/05/2020 04/28/20 04/28/21  Olean Ree, MD    Allergies Patient has no known allergies.  Family History  Problem Relation Age of Onset  . CAD Mother   . Diabetes Mother   . Colon cancer Father   . Breast cancer Paternal Grandmother   . Stomach cancer Paternal Grandfather     Social History Social History   Tobacco Use  . Smoking status: Former Smoker    Types: Cigarettes    Quit date: 1990    Years since quitting: 31.8  . Smokeless tobacco: Never Used  Vaping Use  . Vaping Use: Never used  Substance Use Topics  . Alcohol use: Not Currently  . Drug use: Never    Review of Systems  Review of Systems  Constitutional: Positive for chills, fever and malaise/fatigue.  HENT: Positive for congestion. Negative for sore throat.   Eyes: Negative for pain.  Respiratory: Positive for cough and shortness of breath. Negative for stridor.   Cardiovascular: Positive for chest pain.  Gastrointestinal: Positive for diarrhea. Negative for vomiting.  Genitourinary: Negative  for dysuria.  Musculoskeletal: Positive for myalgias.  Skin: Negative for rash.  Neurological: Negative for seizures, loss of consciousness and headaches.  Psychiatric/Behavioral: Negative for suicidal ideas.  All other systems reviewed and are negative.     ____________________________________________   PHYSICAL EXAM:  VITAL SIGNS: ED Triage Vitals  Enc Vitals Group     BP 09/27/20 1030 (!) 66/46     Pulse Rate 09/27/20 1024 94     Resp 09/27/20 1024 18     Temp 09/27/20 1024 (!) 103.2 F (39.6 C)     Temp Source 09/27/20 1024 Oral     SpO2 09/27/20 1024 96 %     Weight 09/27/20 1031 240 lb (108.9 kg)     Height 09/27/20 1031 5\' 11"  (1.803 m)     Head Circumference --      Peak Flow --      Pain Score 09/27/20 1030 5     Pain  Loc --      Pain Edu? --      Excl. in [place]? --    Vitals:   09/27/20 1040 09/27/20 1130  BP: 127/73 (!) 118/59  Pulse: 88 81  Resp: (!) 27 20  Temp:  100.1 F (37.8 C)  SpO2: 92% 92%   Physical Exam Vitals and nursing note reviewed.  Constitutional:      Appearance: He is well-developed. He is ill-appearing.  HENT:     Head: Normocephalic and atraumatic.     Right Ear: External ear normal.     Left Ear: External ear normal.     Nose: Nose normal.     Mouth/Throat:     Mouth: Mucous membranes are dry.  Eyes:     Conjunctiva/sclera: Conjunctivae normal.  Cardiovascular:     Rate and Rhythm: Normal rate and regular rhythm.     Heart sounds: No murmur heard.   Pulmonary:     Effort: Pulmonary effort is normal. No respiratory distress.     Breath sounds: Decreased breath sounds present.  Abdominal:     Palpations: Abdomen is soft.     Tenderness: There is no abdominal tenderness.  Musculoskeletal:     Cervical back: Neck supple.     Right lower leg: No edema.     Left lower leg: No edema.  Skin:    General: Skin is warm and dry.     Capillary Refill: Capillary refill takes 2 to 3 seconds.  Neurological:     Mental Status: He is  alert and oriented to person, place, and time.  Psychiatric:        Mood and Affect: Mood normal.      ____________________________________________   LABS (all labs ordered are listed, but only abnormal results are displayed)  Labs Reviewed  RESPIRATORY PANEL BY RT PCR (FLU A&B, COVID) - Abnormal; Notable for the following components:      Result Value   SARS Coronavirus 2 by RT PCR POSITIVE (*)    All other components within normal limits  COMPREHENSIVE METABOLIC PANEL - Abnormal; Notable for the following components:   Sodium 133 (*)    Glucose, Bld 133 (*)    BUN 25 (*)    Creatinine, Ser 2.00 (*)    Calcium 8.7 (*)    GFR, Estimated 34 (*)    All other components within normal limits  CBC WITH DIFFERENTIAL/PLATELET - Abnormal; Notable for the following components:   MCV 78.5 (*)    MCH 25.8 (*)    Platelets 137 (*)    All other components within normal limits  BLOOD GAS, VENOUS - Abnormal; Notable for the following components:   pCO2, Ven 43 (*)    All other components within normal limits  URINE CULTURE  CULTURE, BLOOD (ROUTINE X 2)  CULTURE, BLOOD (ROUTINE X 2)  LACTIC ACID, PLASMA  PROTIME-INR  APTT  BRAIN NATRIURETIC PEPTIDE  MAGNESIUM  PROCALCITONIN  LACTIC ACID, PLASMA  URINALYSIS, COMPLETE (UACMP) WITH MICROSCOPIC  HIV ANTIBODY (ROUTINE TESTING W REFLEX)  TROPONIN I (HIGH SENSITIVITY)  TROPONIN I (HIGH SENSITIVITY)   ____________________________________________  EKG  Sinus rhythm with a ventricular rate of 90, normal axis, unremarkable intervals, no clear evidence of acute ischemia or other significant underlying arrhythmia. ____________________________________________  RADIOLOGY  ED MD interpretation: Chest x-ray shows no evidence of large focal consolidation, effusion, pneumonia, or other clear acute intrathoracic process.  Official radiology report(s): CT Angio Chest PE W and/or Wo Contrast  Result Date:  09/27/2020 CLINICAL DATA:  Cough,  shortness of breath.  COVID-19 positive EXAM: CT ANGIOGRAPHY CHEST WITH CONTRAST TECHNIQUE: Multidetector CT imaging of the chest was performed using the standard protocol during bolus administration of intravenous contrast. Multiplanar CT image reconstructions and MIPs were obtained to evaluate the vascular anatomy. CONTRAST:  31mL OMNIPAQUE IOHEXOL 350 MG/ML SOLN COMPARISON:  Same day chest x-ray FINDINGS: Cardiovascular: Satisfactory opacification of the pulmonary arteries to the segmental level. No evidence of pulmonary embolism. Thoracic aorta is nonaneurysmal. Minimal atherosclerotic calcification of the aortic arch. Coronary artery stent is noted. Normal heart size. No pericardial effusion. Mediastinum/Nodes: Mildly prominent subcarinal and right hilar lymph nodes. No axillary lymphadenopathy. Thyroid, trachea, and esophagus within normal limits. Lungs/Pleura: Patchy posterior right lower lobe airspace consolidation with surrounding ground-glass opacity. Additional multifocal areas of predominantly ground-glass opacity within the peripheral aspects of both lung fields. No pleural effusion. No pneumothorax. There are a few small scattered calcified granulomas within the left upper lobe. Upper Abdomen: No acute abnormality. Musculoskeletal: No chest wall abnormality. No acute or significant osseous findings. Review of the MIP images confirms the above findings. IMPRESSION: 1. No evidence of pulmonary embolism. 2. Patchy posterior right lower lobe airspace consolidation. Additional multifocal areas of predominantly ground-glass opacity within the peripheral aspects of both lung fields. Findings are suspicious for multifocal pneumonia in the setting of COVID-19 infection. 3. Mildly prominent subcarinal and right hilar lymph nodes, likely reactive. Electronically Signed   By: Davina Poke D.O.   On: 09/27/2020 13:27   DG Chest Port 1 View  Result Date: 09/27/2020 CLINICAL DATA:  Possible sepsis. COVID  positive with shortness of breath, body aches and cough. EXAM: PORTABLE CHEST 1 VIEW COMPARISON:  Chest radiograph 07/01/2014 FINDINGS: Low lung volumes, which limits evaluation. No focal consolidation. Cardiac silhouette is within normal limits and mildly accentuated by low lung volumes and portable technique. No visible pleural effusions pneumothorax. No acute osseous abnormality. IMPRESSION: No evidence of acute cardiopulmonary disease on this AP portable radiograph with low lung volumes. Electronically Signed   By: Margaretha Sheffield MD   On: 09/27/2020 11:02    ____________________________________________   PROCEDURES  Procedure(s) performed (including Critical Care):  .1-3 Lead EKG Interpretation Performed by: Lucrezia Starch, MD Authorized by: Lucrezia Starch, MD     Interpretation: normal     ECG rate assessment: normal     Rhythm: sinus rhythm     Ectopy: none     Conduction: normal       ____________________________________________   INITIAL IMPRESSION / ASSESSMENT AND PLAN / ED COURSE        Patient presents office to history exam for further assessment after recent diagnosis of COVID-19 for shortness of breath, chest pain, cough, myalgias, diarrhea, and fevers.  On arrival patient is hypotensive with a BP of 66/46, febrile at 103.2, and tachypneic in the mid 20s with an SPO2 of 96% on room air.  However on multiple reassessments patient's SPO2 was noted to be approximately 2%.  Patient was given a liter of IV fluid immediately after being roomed in the ED and reassessment of blood pressure showed it to be 118/59.  Given fever and hypotension arrival with unremarkable chest x-ray full sepsis work-up initiated including CBC, CMP, lactic acid, procalcitonin, UA, and blood cultures.  Regarding patient's chest pain low suspicion for ACS or myocarditis at this time given no clear ischemic changes on ECG and initial troponin that is nonelevated.  However given patient's age  and risk  factors I will plan to obtain a second troponin.  VBG shows no evidence of hypoxic respiratory failure.  Magnesium is WNL.  Patient does not appear volume overloaded on exam or chest x-ray and BNP is 69 and have a low suspicion for CHF at this time.  CBC is remarkable for platelets of 137 with no evidence of pneumonia or leukocytosis.  Low suspicion for acute symptomatic anemia.  CMP remarkable for glucose of 133 and a creatinine of 2 which is compared to that obtained 3 months ago of 1.3 and no other significant other metabolic derangements.  However this does represent an AKI and given his BUN is 25 likely prerenal from volume loss secondary to diarrhea.  To assess for any evidence of PE CTA chest ordered. Patient is Covid is positive I suspect this is likely the etiology for his overall symptoms I will defer antibiotics at this time. Remdesivir ordered.  CTA chest shows evidence of Covid pneumonia but no evidence of PE other acute thoracic process.  Given degree of dehydration from enteritis likely secondary to Covid I will admit to hospital service for further evaluation management.  ____________________________________________   FINAL CLINICAL IMPRESSION(S) / ED DIAGNOSES  Final diagnoses:  AKI (acute kidney injury) ([place])  Dehydration  Enteritis  COVID-19    Medications  remdesivir 200 mg in sodium chloride 0.9% 250 mL IVPB (has no administration in time range)    Followed by  remdesivir 100 mg in sodium chloride 0.9 % 100 mL IVPB (has no administration in time range)  aspirin EC tablet 81 mg (has no administration in time range)  oxyCODONE (Oxy IR/ROXICODONE) immediate release tablet 5 mg (has no administration in time range)  pravastatin (PRAVACHOL) tablet 20 mg (has no administration in time range)  citalopram (CELEXA) tablet 40 mg (has no administration in time range)  traZODone (DESYREL) tablet 50 mg (has no administration in time range)  pantoprazole (PROTONIX) EC  tablet 40 mg (has no administration in time range)  clopidogrel (PLAVIX) tablet 75 mg (has no administration in time range)  gabapentin (NEURONTIN) capsule 600 mg (has no administration in time range)  fluticasone (FLONASE) 50 MCG/ACT nasal spray 2 spray (has no administration in time range)  enoxaparin (LOVENOX) injection 40 mg (has no administration in time range)  sodium chloride flush (NS) 0.9 % injection 3 mL (has no administration in time range)  sodium chloride flush (NS) 0.9 % injection 3 mL (has no administration in time range)  0.9 %  sodium chloride infusion (has no administration in time range)  insulin detemir (LEVEMIR) injection 11 Units (has no administration in time range)  insulin aspart (novoLOG) injection 0-15 Units (has no administration in time range)  acetaminophen (TYLENOL) tablet 650 mg (has no administration in time range)  ondansetron (ZOFRAN) tablet 4 mg (has no administration in time range)    Or  ondansetron (ZOFRAN) injection 4 mg (has no administration in time range)  lactated ringers bolus 1,000 mL (0 mLs Intravenous Stopped 09/27/20 1305)  acetaminophen (TYLENOL) tablet 1,000 mg (1,000 mg Oral Given 09/27/20 1054)  lactated ringers bolus 1,000 mL (1,000 mLs Intravenous New Bag/Given 09/27/20 1316)  iohexol (OMNIPAQUE) 350 MG/ML injection 75 mL (75 mLs Intravenous Contrast Given 09/27/20 1257)     ED Discharge Orders    None       Note:  This document was prepared using Dragon voice recognition software and may include unintentional dictation errors.   Lucrezia Starch, MD 09/27/20 1344

## 2020-09-27 NOTE — Plan of Care (Signed)
Pt admitted today for COVID.  Experiencing restlessness and cough. Started him on robitussin and gave trazadone. No c/o pain.  Currently on RA. O2 sats WNL.

## 2020-09-27 NOTE — ED Triage Notes (Signed)
Patient covid positive and c/o sob, body aches, cough, diarrhea, and feeling fatigued. DX 4 days ago. Patient unable to get comfortable

## 2020-09-27 NOTE — Plan of Care (Signed)

## 2020-09-27 NOTE — Consult Note (Signed)
Remdesivir - Pharmacy Brief Note   O:  ALT: 16 CXR: No evidence of acute cardiopulmonary disease on this AP portable radiograph with low lung volumes SpO2: 92% on RA; pt c/o SOB   A/P:  Remdesivir 200 mg IVPB once followed by 100 mg IVPB daily x 4 days.   Sherilyn Banker, PharmD Pharmacy Resident  09/27/2020 1:02 PM

## 2020-09-27 NOTE — ED Notes (Signed)
Nurse Thedore Mins informed of assigned bed

## 2020-09-28 ENCOUNTER — Encounter: Payer: Self-pay | Admitting: Nurse Practitioner

## 2020-09-28 DIAGNOSIS — F411 Generalized anxiety disorder: Secondary | ICD-10-CM

## 2020-09-28 DIAGNOSIS — I10 Essential (primary) hypertension: Secondary | ICD-10-CM

## 2020-09-28 DIAGNOSIS — N179 Acute kidney failure, unspecified: Secondary | ICD-10-CM

## 2020-09-28 DIAGNOSIS — K219 Gastro-esophageal reflux disease without esophagitis: Secondary | ICD-10-CM

## 2020-09-28 DIAGNOSIS — I5022 Chronic systolic (congestive) heart failure: Secondary | ICD-10-CM

## 2020-09-28 DIAGNOSIS — E119 Type 2 diabetes mellitus without complications: Secondary | ICD-10-CM

## 2020-09-28 DIAGNOSIS — I2510 Atherosclerotic heart disease of native coronary artery without angina pectoris: Secondary | ICD-10-CM

## 2020-09-28 LAB — CBC WITH DIFFERENTIAL/PLATELET
Abs Immature Granulocytes: 0.04 10*3/uL (ref 0.00–0.07)
Basophils Absolute: 0 10*3/uL (ref 0.0–0.1)
Basophils Relative: 0 %
Eosinophils Absolute: 0 10*3/uL (ref 0.0–0.5)
Eosinophils Relative: 0 %
HCT: 36.4 % — ABNORMAL LOW (ref 39.0–52.0)
Hemoglobin: 12.2 g/dL — ABNORMAL LOW (ref 13.0–17.0)
Immature Granulocytes: 1 %
Lymphocytes Relative: 17 %
Lymphs Abs: 1.1 10*3/uL (ref 0.7–4.0)
MCH: 26.3 pg (ref 26.0–34.0)
MCHC: 33.5 g/dL (ref 30.0–36.0)
MCV: 78.6 fL — ABNORMAL LOW (ref 80.0–100.0)
Monocytes Absolute: 0.4 10*3/uL (ref 0.1–1.0)
Monocytes Relative: 6 %
Neutro Abs: 4.7 10*3/uL (ref 1.7–7.7)
Neutrophils Relative %: 76 %
Platelets: 127 10*3/uL — ABNORMAL LOW (ref 150–400)
RBC: 4.63 MIL/uL (ref 4.22–5.81)
RDW: 13.3 % (ref 11.5–15.5)
WBC: 6.2 10*3/uL (ref 4.0–10.5)
nRBC: 0 % (ref 0.0–0.2)

## 2020-09-28 LAB — COMPREHENSIVE METABOLIC PANEL
ALT: 19 U/L (ref 0–44)
AST: 41 U/L (ref 15–41)
Albumin: 3.2 g/dL — ABNORMAL LOW (ref 3.5–5.0)
Alkaline Phosphatase: 45 U/L (ref 38–126)
Anion gap: 9 (ref 5–15)
BUN: 21 mg/dL (ref 8–23)
CO2: 25 mmol/L (ref 22–32)
Calcium: 8.4 mg/dL — ABNORMAL LOW (ref 8.9–10.3)
Chloride: 103 mmol/L (ref 98–111)
Creatinine, Ser: 1.39 mg/dL — ABNORMAL HIGH (ref 0.61–1.24)
GFR, Estimated: 54 mL/min — ABNORMAL LOW (ref >60)
Glucose, Bld: 112 mg/dL — ABNORMAL HIGH (ref 70–99)
Potassium: 4.2 mmol/L (ref 3.5–5.1)
Sodium: 137 mmol/L (ref 135–145)
Total Bilirubin: 0.7 mg/dL (ref 0.3–1.2)
Total Protein: 6.9 g/dL (ref 6.5–8.1)

## 2020-09-28 LAB — C-REACTIVE PROTEIN: CRP: 22.6 mg/dL — ABNORMAL HIGH (ref <1.0)

## 2020-09-28 LAB — PHOSPHORUS: Phosphorus: 4 mg/dL (ref 2.5–4.6)

## 2020-09-28 LAB — GLUCOSE, CAPILLARY
Glucose-Capillary: 115 mg/dL — ABNORMAL HIGH (ref 70–99)
Glucose-Capillary: 100 mg/dL — ABNORMAL HIGH (ref 70–99)
Glucose-Capillary: 85 mg/dL (ref 70–99)
Glucose-Capillary: 93 mg/dL (ref 70–99)

## 2020-09-28 LAB — FIBRIN DERIVATIVES D-DIMER (ARMC ONLY): Fibrin derivatives D-dimer (ARMC): 1792.51 ng/mL (FEU) — ABNORMAL HIGH (ref 0.00–499.00)

## 2020-09-28 LAB — MAGNESIUM: Magnesium: 2.1 mg/dL (ref 1.7–2.4)

## 2020-09-28 LAB — FERRITIN: Ferritin: 342 ng/mL — ABNORMAL HIGH (ref 24–336)

## 2020-09-28 MED ORDER — ENOXAPARIN SODIUM 60 MG/0.6ML ~~LOC~~ SOLN
0.5000 mg/kg | SUBCUTANEOUS | Status: DC
  Administered 2020-09-28: 55 mg via SUBCUTANEOUS
  Filled 2020-09-28 (×2): qty 0.6

## 2020-09-28 MED ORDER — HYDROCOD POLST-CPM POLST ER 10-8 MG/5ML PO SUER
5.0000 mL | Freq: Two times a day (BID) | ORAL | Status: DC | PRN
  Administered 2020-09-28 – 2020-09-29 (×3): 5 mL via ORAL
  Filled 2020-09-28 (×3): qty 5

## 2020-09-28 MED ORDER — LOPERAMIDE HCL 2 MG PO CAPS
4.0000 mg | ORAL_CAPSULE | Freq: Once | ORAL | Status: AC
  Administered 2020-09-28: 13:00:00 4 mg via ORAL
  Filled 2020-09-28: qty 2

## 2020-09-28 MED ORDER — LOPERAMIDE HCL 2 MG PO CAPS: 2.0000 mg | ORAL_CAPSULE | ORAL | Status: DC | PRN

## 2020-09-28 MED ORDER — ALBUTEROL SULFATE HFA 108 (90 BASE) MCG/ACT IN AERS
2.0000 | INHALATION_SPRAY | RESPIRATORY_TRACT | Status: DC | PRN
  Administered 2020-09-28 (×2): 2 via RESPIRATORY_TRACT
  Filled 2020-09-28 (×2): qty 6.7

## 2020-09-28 NOTE — Progress Notes (Signed)
Wife Silva Bandy given daily update

## 2020-09-28 NOTE — TOC Initial Note (Signed)
Transition of Care [facility]) - Initial/Assessment Note    Patient Details  Name: Steven Vang MRN: [id number] Date of Birth: 03/11/1956  [facility] CM/SW Contact:    Shelbie Hutching, RN Phone Number: 09/28/2020, 10:26 AM  Clinical Narrative:                 Patient admitted to the hospital with COVID, currently not requiring any supplemental oxygen.  RNCM was unable to reach patient by phone but was able to reach his wife Silva Bandy.  Silva Bandy reports that she and the patient just got married this past Friday and they both have COVID right now.  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  Patient is current with his PCP and uses [facility].  Silva Bandy will be able to pick patient up at discharge.  No discharge needs identified at this time.    Expected Discharge Plan: Home/Self Care Barriers to Discharge: Continued Medical Work up   Patient Goals and CMS Choice        Expected Discharge Plan and Services Expected Discharge Plan: Home/Self Care   Discharge Planning Services: CM Consult   Living arrangements for the past 2 months: Single Family Home                                      Prior Living Arrangements/Services Living arrangements for the past 2 months: Single Family Home Lives with:: Spouse Patient language and need for interpreter reviewed:: Yes Do you feel safe going back to the place where you live?: Yes      Need for Family Participation in Patient Care: Yes (Comment) (COVID) Care giver support system in place?: Yes (comment) (wife)   Criminal Activity/Legal Involvement Pertinent to Current Situation/Hospitalization: No - Comment as needed  Activities of Daily Living Home Assistive Devices/Equipment: None ADL Screening (condition at time of admission) Patient's cognitive ability adequate to safely complete daily activities?: Yes Is the patient deaf or have difficulty hearing?: No Does the patient have difficulty seeing, even  when wearing glasses/contacts?: No Does the patient have difficulty concentrating, remembering, or making decisions?: No Patient able to express need for assistance with ADLs?: Yes Does the patient have difficulty dressing or bathing?: No Independently performs ADLs?: Yes (appropriate for developmental age) Does the patient have difficulty walking or climbing stairs?: No Weakness of Legs: None Weakness of Arms/Hands: None  Permission Sought/Granted Permission sought to share information with : Case Manager, Family Supports Permission granted to share information with : Yes, Verbal Permission Granted  Share Information with NAME: Silva Bandy     Permission granted to share info w Relationship: wife  Permission granted to share info w Contact Information: [phone]  Emotional Assessment       Orientation: : Oriented to Self, Oriented to Place, Oriented to  Time, Oriented to Situation Alcohol / Substance Use: Not Applicable Psych Involvement: No (comment)  Admission diagnosis:  Dehydration [E86.0] Enteritis [K52.9] AKI (acute kidney injury) ([place]) [N17.9] COVID-19 [U07.1] Patient Active Problem List   Diagnosis Date Noted  . AKI (acute kidney injury) ([place]) 09/27/2020  . History of tobacco use 06/23/2020  . Cough 06/23/2020  . Unstable angina ([place]) 05/19/2020  . Lipoma of head 03/31/2020  . Mild episode of recurrent major depressive disorder ([place]) 01/05/2016  . Chronic systolic heart failure ([place]) 01/13/2015  . Coronary artery disease 01/13/2015  . Essential hypertension 01/13/2015  .  Left ventricular dysfunction 01/13/2015  . Type 2 diabetes mellitus without complication, without long-term current use of insulin ([place]) 01/13/2015  . Anxiety disorder 12/20/2014  . GERD (gastroesophageal reflux disease) 12/20/2014  . Mixed hyperlipidemia 12/20/2014  . Non morbid obesity due to excess calories 12/20/2014   PCP:  Eulogio Bear, NP Pharmacy:   [id number] [place] [address] [place] [address] Phone: [phone] Fax: [phone]     Social Determinants of Health (SDOH) Interventions    Readmission Risk Interventions No flowsheet data found.

## 2020-09-28 NOTE — Progress Notes (Signed)
PROGRESS NOTE  Steven Vang  TKP:[id number] DOB: 12/23/1955 DOA: 09/27/2020 PCP: Eulogio Bear, NP   Brief Narrative: Steven Vang is a 64 y.o. male with a history of CAD, ICM, HFrEF, HTN, HLD, T2DM, obesity and covid-19 diagnosed 10/8 who presented to the ED 10/13 with fever, fatigue, diarrhea, cough, and shortness of breath. He was febrile to 103.65F, not hypoxic at rest. CXR was low volume, d-dimer elevated at 1,792 and subsequent CTA chest showed no PE but revealed consolidative RLL and peripheral GGOs consistent with covid pneumonia. Creatinine was 2 from a baseline of 1.3 indicating AKI. Remdesivir was started, IV fluids were given, diuretic held, and he was admitted.  Assessment & Plan: Principal Problem:   AKI (acute kidney injury) ([place]) Active Problems:   Anxiety disorder   Chronic systolic heart failure ([place])   Coronary artery disease   Essential hypertension   GERD (gastroesophageal reflux disease)   Type 2 diabetes mellitus without complication, without long-term current use of insulin ([facility])  AKI: Due to ATN from hypotension and dehydration. Improving.  - SCr back to presumed baseline of 1.30, CrCl >83ml/min. Will continue monitoring renal parameters after receiving CTA chest 10/13. Hold IVF and diuretics. Avoid nephrotoxins and hypotension.   Covid-19 pneumonia: +Infiltrates, cough, dyspnea, though no hypoxemia currently, so not steroid candidate at this time. SARS-CoV-2 testing initially positive at [facility] 10/8.  - Continue remdesivir x5 days (10/13 - 10/17) - Encourage OOB, IS, FV, and awake proning if able - Continue airborne, contact precautions for 21 days from positive testing. - Monitor CMP and inflammatory markers - Enoxaparin weight-based prophylactic dose.  - Encouraged to get vaccine after isolation period.  - Add tussionex for antitussive effect. Trial albuterol prn.  - Sputum culture (CTA chest personally reviewed with consolidative changes in RLL as  well as typical peripheral-predominant GGOs bilaterally which are mild). No leukocytosis to further suggest bacterial pneumonia, though this is on differential. PCT 0.30 which is borderline, will trend in AM.   Diarrhea due to covid-19 gastroenteritis:  - Imodium prn  CAD, ICM, chronic HFrEF, HTN: Most recent LVEF 50%. No angina, troponin wnl. BNP 69.6.  - Appears euvolemic and was hypovolemic at admission. Will continue to hold lasix, ARB due to AKI. Holding imdur, metoprolol since he's normotensive and was hypotensive and has no tachycardia or anginal complaints.  - Continue DAPT, statin  T2DM:  - Continue SSI. Will DC levemir as CBGs are at inpatient goal and po intake has been minimal.   Obesity: Estimated body mass index is 33.47 kg/m as calculated from the following:   Height as of this encounter: 5\' 11"  (1.803 m).   Weight as of this encounter: 108.9 kg.   Hyponatremia: Hypovolemic, resolved.   DVT prophylaxis: Lovenox 0.5mg /kg q24h Code Status: Full Family Communication: Declined offer for me to call Disposition Plan:  Status is: Inpatient  Remains inpatient appropriate because:Inpatient level of care appropriate due to severity of illness  Dispo: The patient is from: Home              Anticipated d/c is to: Home              Anticipated d/c date is: 1 day              Patient currently is not medically stable to d/c.  Consultants:   None  Procedures:   None  Antimicrobials:  Remdesivir   Subjective: Feels about the same from admission, some shortness of breath when  getting up to the bathroom. Has had multiple loose stools with formed parts in them this morning, no appetite, not having eaten anything this morning. Chest feels tight when he coughs which also causes dyspnea but no exertional chest discomfort. No leg swelling. +Fever.   Objective: Vitals:   09/28/20 0722 09/28/20 0900 09/28/20 1043 09/28/20 1126  BP: 128/66   132/84  Pulse: 88 95 79   Resp:  (!) 21     Temp: 99.6 F (37.6 C)   98.9 F (37.2 C)  TempSrc: Oral   Oral  SpO2: 94% 94% 93%   Weight:      Height:        Intake/Output Summary (Last 24 hours) at 09/28/2020 1205 Last data filed at 09/28/2020 0950 Gross per 24 hour  Intake 2389.31 ml  Output --  Net 2389.31 ml   Filed Weights   09/27/20 1031  Weight: 108.9 kg    Gen: 64 y.o. male in no distress, tired-appearing Pulm: Non-labored breathing room air, tachypneic, diminished throughout.  CV: Regular rate and rhythm. No murmur, rub, or gallop. No JVD, no pedal edema. GI: Abdomen soft, non-tender, non-distended, with normoactive bowel sounds. No organomegaly or masses felt. Ext: Warm, no deformities Skin: No rashes, lesions or ulcers Neuro: Alert and oriented. No focal neurological deficits. Psych: Judgement and insight appear normal. Mood & affect appropriate.   Data Reviewed: I have personally reviewed following labs and imaging studies  CBC: Recent Labs  Lab 09/27/20 1041 09/28/20 0441  WBC 9.3 6.2  NEUTROABS 7.7 4.7  HGB 13.2 12.2*  HCT 40.2 36.4*  MCV 78.5* 78.6*  PLT 137* 222*   Basic Metabolic Panel: Recent Labs  Lab 09/27/20 1041 09/28/20 0441  NA 133* 137  K 4.3 4.2  CL 98 103  CO2 22 25  GLUCOSE 133* 112*  BUN 25* 21  CREATININE 2.00* 1.39*  CALCIUM 8.7* 8.4*  MG 2.0 2.1  PHOS  --  4.0   GFR: Estimated Creatinine Clearance: 68.2 mL/min (A) (by C-G formula based on SCr of 1.39 mg/dL (H)). Liver Function Tests: Recent Labs  Lab 09/27/20 1041 09/28/20 0441  AST 35 41  ALT 16 19  ALKPHOS 49 45  BILITOT 1.1 0.7  PROT 7.7 6.9  ALBUMIN 3.7 3.2*   No results for input(s): LIPASE, AMYLASE in the last 168 hours. No results for input(s): AMMONIA in the last 168 hours. Coagulation Profile: Recent Labs  Lab 09/27/20 1041  INR 1.1   Cardiac Enzymes: No results for input(s): CKTOTAL, CKMB, CKMBINDEX, TROPONINI in the last 168 hours. BNP (last 3 results) No results for  input(s): PROBNP in the last 8760 hours. HbA1C: No results for input(s): HGBA1C in the last 72 hours. CBG: Recent Labs  Lab 09/27/20 1601 09/27/20 2034 09/28/20 0805  GLUCAP 126* 107* 115*   Lipid Profile: No results for input(s): CHOL, HDL, LDLCALC, TRIG, CHOLHDL, LDLDIRECT in the last 72 hours. Thyroid Function Tests: No results for input(s): TSH, T4TOTAL, FREET4, T3FREE, THYROIDAB in the last 72 hours. Anemia Panel: Recent Labs    09/28/20 0441  FERRITIN 342*   Urine analysis: No results found for: COLORURINE, APPEARANCEUR, LABSPEC, PHURINE, GLUCOSEU, HGBUR, BILIRUBINUR, KETONESUR, PROTEINUR, UROBILINOGEN, NITRITE, LEUKOCYTESUR Recent Results (from the past 240 hour(s))  Respiratory Panel by RT PCR (Flu A&B, Covid) - Nasopharyngeal Swab     Status: Abnormal   Collection Time: 09/27/20 10:05 AM   Specimen: Nasopharyngeal Swab  Result Value Ref Range Status   SARS Coronavirus 2  by RT PCR POSITIVE (A) NEGATIVE Final    Comment: RESULT CALLED TO, READ BACK BY AND VERIFIED WITH: [facility] REGISTER 09/27/20 1241 KLW (NOTE) SARS-CoV-2 target nucleic acids are DETECTED.  SARS-CoV-2 RNA is generally detectable in upper respiratory specimens  during the acute phase of infection. Positive results are indicative of the presence of the identified virus, but do not rule out bacterial infection or co-infection with other pathogens not detected by the test. Clinical correlation with patient history and other diagnostic information is necessary to determine patient infection status. The expected result is Negative.  Fact Sheet for Patients:  PinkCheek.be  Fact Sheet for Healthcare Providers: GravelBags.it  This test is not yet approved or cleared by the [place] FDA and  has been authorized for detection and/or diagnosis of SARS-CoV-2 by FDA under an Emergency Use Authorization (EUA).  This EUA will remain in effect  (meaning this test can be use d) for the duration of  the COVID-19 declaration under Section 564(b)(1) of the Act, 21 U.S.C. section 360bbb-3(b)(1), unless the authorization is terminated or revoked sooner.      Influenza A by PCR NEGATIVE NEGATIVE Final   Influenza B by PCR NEGATIVE NEGATIVE Final    Comment: (NOTE) The Xpert Xpress SARS-CoV-2/FLU/RSV assay is intended as an aid in  the diagnosis of influenza from Nasopharyngeal swab specimens and  should not be used as a sole basis for treatment. Nasal washings and  aspirates are unacceptable for Xpert Xpress SARS-CoV-2/FLU/RSV  testing.  Fact Sheet for Patients: PinkCheek.be  Fact Sheet for Healthcare Providers: GravelBags.it  This test is not yet approved or cleared by the [place] FDA and  has been authorized for detection and/or diagnosis of SARS-CoV-2 by  FDA under an Emergency Use Authorization (EUA). This EUA will remain  in effect (meaning this test can be used) for the duration of the  Covid-19 declaration under Section 564(b)(1) of the Act, 21  U.S.C. section 360bbb-3(b)(1), unless the authorization is  terminated or revoked. Performed at [facility], [address] [place], [place] [address]   Blood culture (routine x 2)     Status: None (Preliminary result)   Collection Time: 09/27/20 10:47 AM   Specimen: BLOOD  Result Value Ref Range Status   Specimen Description BLOOD LEFT ANTECUBITAL  Final   Special Requests   Final    BOTTLES DRAWN AEROBIC AND ANAEROBIC Blood Culture adequate volume   Culture   Final    NO GROWTH < 24 HOURS Performed at [facility], [address], [place], [place] [address]    Report Status PENDING  Incomplete  Blood culture (routine x 2)     Status: None (Preliminary result)   Collection Time: 09/27/20 10:47 AM   Specimen: BLOOD  Result Value Ref Range Status   Specimen Description BLOOD BLOOD  RIGHT HAND  Final   Special Requests   Final    BOTTLES DRAWN AEROBIC AND ANAEROBIC Blood Culture adequate volume   Culture   Final    NO GROWTH < 24 HOURS Performed at [facility], [address], [place], [place] [address]    Report Status PENDING  Incomplete      Radiology Studies: CT Angio Chest PE W and/or Wo Contrast  Result Date: 09/27/2020 CLINICAL DATA:  Cough, shortness of breath.  COVID-19 positive EXAM: CT ANGIOGRAPHY CHEST WITH CONTRAST TECHNIQUE: Multidetector CT imaging of the chest was performed using the standard protocol during bolus administration of intravenous contrast. Multiplanar  CT image reconstructions and MIPs were obtained to evaluate the vascular anatomy. CONTRAST:  27mL OMNIPAQUE IOHEXOL 350 MG/ML SOLN COMPARISON:  Same day chest x-ray FINDINGS: Cardiovascular: Satisfactory opacification of the pulmonary arteries to the segmental level. No evidence of pulmonary embolism. Thoracic aorta is nonaneurysmal. Minimal atherosclerotic calcification of the aortic arch. Coronary artery stent is noted. Normal heart size. No pericardial effusion. Mediastinum/Nodes: Mildly prominent subcarinal and right hilar lymph nodes. No axillary lymphadenopathy. Thyroid, trachea, and esophagus within normal limits. Lungs/Pleura: Patchy posterior right lower lobe airspace consolidation with surrounding ground-glass opacity. Additional multifocal areas of predominantly ground-glass opacity within the peripheral aspects of both lung fields. No pleural effusion. No pneumothorax. There are a few small scattered calcified granulomas within the left upper lobe. Upper Abdomen: No acute abnormality. Musculoskeletal: No chest wall abnormality. No acute or significant osseous findings. Review of the MIP images confirms the above findings. IMPRESSION: 1. No evidence of pulmonary embolism. 2. Patchy posterior right lower lobe airspace consolidation. Additional multifocal areas of predominantly  ground-glass opacity within the peripheral aspects of both lung fields. Findings are suspicious for multifocal pneumonia in the setting of COVID-19 infection. 3. Mildly prominent subcarinal and right hilar lymph nodes, likely reactive. Electronically Signed   By: Davina Poke D.O.   On: 09/27/2020 13:27   DG Chest Port 1 View  Result Date: 09/27/2020 CLINICAL DATA:  Possible sepsis. COVID positive with shortness of breath, body aches and cough. EXAM: PORTABLE CHEST 1 VIEW COMPARISON:  Chest radiograph 07/01/2014 FINDINGS: Low lung volumes, which limits evaluation. No focal consolidation. Cardiac silhouette is within normal limits and mildly accentuated by low lung volumes and portable technique. No visible pleural effusions pneumothorax. No acute osseous abnormality. IMPRESSION: No evidence of acute cardiopulmonary disease on this AP portable radiograph with low lung volumes. Electronically Signed   By: Margaretha Sheffield MD   On: 09/27/2020 11:02    Scheduled Meds: . aspirin EC  81 mg Oral Daily  . citalopram  40 mg Oral Daily  . clopidogrel  75 mg Oral Daily  . enoxaparin (LOVENOX) injection  0.5 mg/kg Subcutaneous Q24H  . fluticasone  2 spray Each Nare Daily  . insulin aspart  0-15 Units Subcutaneous TID WC  . loperamide  4 mg Oral Once  . pantoprazole  40 mg Oral Daily  . pravastatin  20 mg Oral QPM  . sodium chloride flush  3 mL Intravenous Q12H   Continuous Infusions: . sodium chloride    . remdesivir 100 mg in NS 100 mL Stopped (09/28/20 0911)     LOS: 1 day   Time spent: 35 minutes.  Patrecia Pour, MD [facility] [email or web address] 09/28/2020, 12:05 PM

## 2020-09-28 NOTE — Progress Notes (Signed)
   09/28/20 0054  Assess: MEWS Score  Temp (!) 102.6 F (39.2 C)  BP 105/60  Pulse Rate (!) 106  Resp (!) 22  SpO2 97 %  Assess: MEWS Score  MEWS Temp 2  MEWS Systolic 0  MEWS Pulse 1  MEWS RR 1  MEWS LOC 0  MEWS Score 4  MEWS Score Color Red  Assess: if the MEWS score is Yellow or Red  Were vital signs taken at a resting state? Yes  Focused Assessment No change from prior assessment  Early Detection of Sepsis Score *See Row Information* Medium  MEWS guidelines implemented *See Row Information* No, vital signs rechecked  Notify: Charge Nurse/RN  Date Charge Nurse/RN Notified 09/28/20  Time Charge Nurse/RN Notified 0055  gave tylenol - if fever doesn't come down, will reach out to NP.

## 2020-09-28 NOTE — Progress Notes (Signed)
PHARMACIST - PHYSICIAN COMMUNICATION  CONCERNING:  Enoxaparin (Lovenox) for DVT Prophylaxis    RECOMMENDATION: Patient was prescribed enoxaparin 40mg  q24 hours for VTE prophylaxis.   Filed Weights   09/27/20 1031  Weight: 108.9 kg (240 lb)    Body mass index is 33.47 kg/m.  Estimated Creatinine Clearance: 68.2 mL/min (A) (by C-G formula based on SCr of 1.39 mg/dL (H)).   Based on [place] patient is candidate for enoxaparin 0.5mg /kg TBW SQ every 24 hours based on BMI being >30.  DESCRIPTION: Pharmacy has adjusted enoxaparin dose per [facility] policy.  Patient is now receiving enoxaparin 55 mg every 24 hours    Benita Gutter 09/28/2020 10:32 AM

## 2020-09-28 NOTE — Progress Notes (Signed)
Daily update given to son Ysidro Evert, also chatted with pt via video call

## 2020-09-29 ENCOUNTER — Inpatient Hospital Stay: Payer: 59

## 2020-09-29 LAB — CBC WITH DIFFERENTIAL/PLATELET
Abs Immature Granulocytes: 0.06 10*3/uL (ref 0.00–0.07)
Basophils Absolute: 0 10*3/uL (ref 0.0–0.1)
Basophils Relative: 1 %
Eosinophils Absolute: 0 10*3/uL (ref 0.0–0.5)
Eosinophils Relative: 0 %
HCT: 37.1 % — ABNORMAL LOW (ref 39.0–52.0)
Hemoglobin: 12.2 g/dL — ABNORMAL LOW (ref 13.0–17.0)
Immature Granulocytes: 1 %
Lymphocytes Relative: 18 %
Lymphs Abs: 1.2 10*3/uL (ref 0.7–4.0)
MCH: 26 pg (ref 26.0–34.0)
MCHC: 32.9 g/dL (ref 30.0–36.0)
MCV: 79.1 fL — ABNORMAL LOW (ref 80.0–100.0)
Monocytes Absolute: 0.4 10*3/uL (ref 0.1–1.0)
Monocytes Relative: 6 %
Neutro Abs: 5 10*3/uL (ref 1.7–7.7)
Neutrophils Relative %: 74 %
Platelets: 187 10*3/uL (ref 150–400)
RBC: 4.69 MIL/uL (ref 4.22–5.81)
RDW: 13.5 % (ref 11.5–15.5)
WBC: 6.7 10*3/uL (ref 4.0–10.5)
nRBC: 0 % (ref 0.0–0.2)

## 2020-09-29 LAB — FIBRIN DERIVATIVES D-DIMER (ARMC ONLY): Fibrin derivatives D-dimer (ARMC): 2144.45 ng/mL (FEU) — ABNORMAL HIGH (ref 0.00–499.00)

## 2020-09-29 LAB — COMPREHENSIVE METABOLIC PANEL
ALT: 24 U/L (ref 0–44)
AST: 51 U/L — ABNORMAL HIGH (ref 15–41)
Albumin: 3.3 g/dL — ABNORMAL LOW (ref 3.5–5.0)
Alkaline Phosphatase: 51 U/L (ref 38–126)
Anion gap: 10 (ref 5–15)
BUN: 24 mg/dL — ABNORMAL HIGH (ref 8–23)
CO2: 23 mmol/L (ref 22–32)
Calcium: 8.5 mg/dL — ABNORMAL LOW (ref 8.9–10.3)
Chloride: 99 mmol/L (ref 98–111)
Creatinine, Ser: 1.42 mg/dL — ABNORMAL HIGH (ref 0.61–1.24)
GFR, Estimated: 52 mL/min — ABNORMAL LOW (ref >60)
Glucose, Bld: 107 mg/dL — ABNORMAL HIGH (ref 70–99)
Potassium: 4.4 mmol/L (ref 3.5–5.1)
Sodium: 132 mmol/L — ABNORMAL LOW (ref 135–145)
Total Bilirubin: 0.8 mg/dL (ref 0.3–1.2)
Total Protein: 7.3 g/dL (ref 6.5–8.1)

## 2020-09-29 LAB — PROCALCITONIN: Procalcitonin: 0.33 ng/mL

## 2020-09-29 LAB — GLUCOSE, CAPILLARY
Glucose-Capillary: 93 mg/dL (ref 70–99)
Glucose-Capillary: 76 mg/dL (ref 70–99)
Glucose-Capillary: 181 mg/dL — ABNORMAL HIGH (ref 70–99)
Glucose-Capillary: 199 mg/dL — ABNORMAL HIGH (ref 70–99)

## 2020-09-29 LAB — C-REACTIVE PROTEIN: CRP: 18.6 mg/dL — ABNORMAL HIGH (ref <1.0)

## 2020-09-29 MED ORDER — ENOXAPARIN SODIUM 60 MG/0.6ML ~~LOC~~ SOLN
0.5000 mg/kg | Freq: Two times a day (BID) | SUBCUTANEOUS | Status: DC
  Administered 2020-09-29 – 2020-09-30 (×2): 55 mg via SUBCUTANEOUS
  Filled 2020-09-29 (×3): qty 0.6

## 2020-09-29 MED ORDER — METHYLPREDNISOLONE SODIUM SUCC 125 MG IJ SOLR
125.0000 mg | Freq: Once | INTRAMUSCULAR | Status: AC
  Administered 2020-09-29: 13:00:00 125 mg via INTRAVENOUS
  Filled 2020-09-29: qty 2

## 2020-09-29 MED ORDER — METHYLPREDNISOLONE SODIUM SUCC 125 MG IJ SOLR
80.0000 mg | Freq: Two times a day (BID) | INTRAMUSCULAR | Status: DC
  Administered 2020-09-29 – 2020-09-30 (×2): 80 mg via INTRAVENOUS
  Filled 2020-09-29 (×2): qty 2

## 2020-09-29 NOTE — Progress Notes (Signed)
PROGRESS NOTE  Steven Vang  YHC:[id number] DOB: [birth year] DOA: 09/27/2020 PCP: Eulogio Bear, NP   Brief Narrative: Steven Vang is a 64 y.o. male with a history of CAD, ICM, HFrEF, HTN, HLD, T2DM, obesity and covid-19 diagnosed 10/8 who presented to the ED 10/13 with fever, fatigue, diarrhea, cough, and shortness of breath. He was febrile to 103.80F, not hypoxic at rest. CXR was low volume, d-dimer elevated at 1,792 and subsequent CTA chest showed no PE but revealed consolidative RLL and peripheral GGOs consistent with covid pneumonia. Creatinine was 2 from a baseline of 1.3 indicating AKI. Remdesivir was started, IV fluids were given, diuretic held, and he was admitted.  Assessment & Plan: Principal Problem:   AKI (acute kidney injury) ([place]) Active Problems:   Anxiety disorder   Chronic systolic heart failure ([place])   Coronary artery disease   Essential hypertension   GERD (gastroesophageal reflux disease)   Type 2 diabetes mellitus without complication, without long-term current use of insulin ([facility])  AKI: Due to ATN from hypotension and dehydration. Improved - SCr back to presumed baseline with CrCl >37ml/min. Will continue monitoring renal parameters after receiving CTA chest 10/13.  - Hold IVF and diuretics. - Avoid nephrotoxins and hypotension.   Acute hypoxic respiratory failure due to covid-19 pneumonia: SARS-CoV-2 testing initially positive [facility] 10[facility].  - Continue remdesivir x5 days (10/13 - 10/17) - Pt became hypoxemic overnight on 10/14. Exam consistent with pneumonia progression, CXR ordered and shows progression of airspace opacities on my personal review. Start IV steroids. Note CRP elevation is severe and remains high (22.6 > 18.6). With this level of inflammation and d-dimer trending upward, the risk of life-threatening VTE outweighs the risk of life-threatening GI bleed. Will start lovenox 0.5mg /kg q12h for now. - Encourage OOB, IS, FV, and awake proning if  able - Continue airborne, contact precautions for 21 days from positive testing. - Monitor CMP and inflammatory markers - Encouraged to get vaccine after isolation period.  - Add tussionex for antitussive effect. Trial albuterol prn.  - Sputum culture pending. (CTA chest personally reviewed with consolidative changes in RLL as well as typical peripheral-predominant GGOs bilaterally which are mild). No leukocytosis to further suggest bacterial pneumonia, though this is on differential. PCT is stable near 0.30 which is commensurate with CRP elevation.   Diarrhea due to covid-19 gastroenteritis: Improved. - Imodium prn  CAD, ICM, chronic HFrEF, HTN: Most recent LVEF 50%. No angina, troponin wnl. BNP 69.6.  - Appears euvolemic and was hypovolemic at admission. Will continue to hold lasix, ARB due to AKI. Holding imdur, metoprolol since he's normotensive and was hypotensive and has no tachycardia or anginal complaints. Normal BP, HR most recently. - Continue DAPT, statin  T2DM:  - Continue SSI. CBGs are at inpatient goal  Obesity: Estimated body mass index is 33.47 kg/m as calculated from the following:   Height as of this encounter: 5\' 11"  (1.803 m).   Weight as of this encounter: 108.9 kg.   Hyponatremia: Hypovolemic, resolved.   DVT prophylaxis: Lovenox 0.5mg /kg q12h Code Status: Full Family Communication: Declined offer for me to call Disposition Plan:  Status is: Inpatient  Remains inpatient appropriate because:Inpatient level of care appropriate due to severity of illness  Dispo: The patient is from: Home              Anticipated d/c is to: Home              Anticipated d/c date is: 2 days  to continue treatment for new hypoxemia due to covid pneumonia              Patient currently is not medically stable to d/c.  Consultants:   None  Procedures:   None  Antimicrobials:  Remdesivir   Subjective: Still with cough, less diarrhea, tolerating po, but has some shortness  of breath with ambulation. Oxygen desaturation noted overnight so he was placed on 2L O2 with improvement. No chest pain or leg swelling.  Objective: Vitals:   09/28/20 2335 09/29/20 0451 09/29/20 0813 09/29/20 1202  BP: (!) 117/55 (!) 103/55 (!) 150/85 93/81  Pulse: 92 85 83 83  Resp: 18 16 18 20   Temp: 99.9 F (37.7 C) 99.9 F (37.7 C) 98.7 F (37.1 C) 98.6 F (37 C)  TempSrc: Oral Oral Oral Oral  SpO2: 96% 95% 94% 95%  Weight:      Height:        Intake/Output Summary (Last 24 hours) at 09/29/2020 1240 Last data filed at 09/29/2020 0900 Gross per 24 hour  Intake --  Output 250 ml  Net -250 ml   Filed Weights   09/27/20 1031  Weight: 108.9 kg   Gen: 64 y.o. male in no distress Pulm: Nonlabored tachypnea at rest, elevated RR with just sitting up from laying position. Crackles more prominent diffusely today. CV: Regular rate and rhythm. No murmur, rub, or gallop. No JVD, no dependent edema. GI: Abdomen soft, non-tender, non-distended, with normoactive bowel sounds.  Ext: Warm, no deformities Skin: No rashes, lesions or ulcers on visualized skin. Neuro: Alert and oriented. No focal neurological deficits. Psych: Judgement and insight appear fair. Mood euthymic & affect congruent. Behavior is appropriate.    Data Reviewed: I have personally reviewed following labs and imaging studies  CBC: Recent Labs  Lab 09/27/20 1041 09/28/20 0441 09/29/20 0643  WBC 9.3 6.2 6.7  NEUTROABS 7.7 4.7 5.0  HGB 13.2 12.2* 12.2*  HCT 40.2 36.4* 37.1*  MCV 78.5* 78.6* 79.1*  PLT 137* 127* 093   Basic Metabolic Panel: Recent Labs  Lab 09/27/20 1041 09/28/20 0441 09/29/20 0643  NA 133* 137 132*  K 4.3 4.2 4.4  CL 98 103 99  CO2 22 25 23   GLUCOSE 133* 112* 107*  BUN 25* 21 24*  CREATININE 2.00* 1.39* 1.42*  CALCIUM 8.7* 8.4* 8.5*  MG 2.0 2.1  --   PHOS  --  4.0  --    GFR: Estimated Creatinine Clearance: 66.8 mL/min (A) (by C-G formula based on SCr of 1.42 mg/dL  (H)). Liver Function Tests: Recent Labs  Lab 09/27/20 1041 09/28/20 0441 09/29/20 0643  AST 35 41 51*  ALT 16 19 24   ALKPHOS 49 45 51  BILITOT 1.1 0.7 0.8  PROT 7.7 6.9 7.3  ALBUMIN 3.7 3.2* 3.3*   No results for input(s): LIPASE, AMYLASE in the last 168 hours. No results for input(s): AMMONIA in the last 168 hours. Coagulation Profile: Recent Labs  Lab 09/27/20 1041  INR 1.1   Cardiac Enzymes: No results for input(s): CKTOTAL, CKMB, CKMBINDEX, TROPONINI in the last 168 hours. BNP (last 3 results) No results for input(s): PROBNP in the last 8760 hours. HbA1C: No results for input(s): HGBA1C in the last 72 hours. CBG: Recent Labs  Lab 09/28/20 0805 09/28/20 1207 09/28/20 1550 09/28/20 2126 09/29/20 0814  GLUCAP 115* 100* 85 93 93   Lipid Profile: No results for input(s): CHOL, HDL, LDLCALC, TRIG, CHOLHDL, LDLDIRECT in the last 72 hours. Thyroid Function  Tests: No results for input(s): TSH, T4TOTAL, FREET4, T3FREE, THYROIDAB in the last 72 hours. Anemia Panel: Recent Labs    09/28/20 0441  FERRITIN 342*   Urine analysis: No results found for: COLORURINE, APPEARANCEUR, LABSPEC, PHURINE, GLUCOSEU, HGBUR, BILIRUBINUR, KETONESUR, PROTEINUR, UROBILINOGEN, NITRITE, LEUKOCYTESUR Recent Results (from the past 240 hour(s))  Respiratory Panel by RT PCR (Flu A&B, Covid) - Nasopharyngeal Swab     Status: Abnormal   Collection Time: 09/27/20 10:05 AM   Specimen: Nasopharyngeal Swab  Result Value Ref Range Status   SARS Coronavirus 2 by RT PCR POSITIVE (A) NEGATIVE Final    Comment: RESULT CALLED TO, READ BACK BY AND VERIFIED WITH: [facility] REGISTER 09/27/20 1241 KLW (NOTE) SARS-CoV-2 target nucleic acids are DETECTED.  SARS-CoV-2 RNA is generally detectable in upper respiratory specimens  during the acute phase of infection. Positive results are indicative of the presence of the identified virus, but do not rule out bacterial infection or co-infection with other pathogens  not detected by the test. Clinical correlation with patient history and other diagnostic information is necessary to determine patient infection status. The expected result is Negative.  Fact Sheet for Patients:  PinkCheek.be  Fact Sheet for Healthcare Providers: GravelBags.it  This test is not yet approved or cleared by the [place] FDA and  has been authorized for detection and/or diagnosis of SARS-CoV-2 by FDA under an Emergency Use Authorization (EUA).  This EUA will remain in effect (meaning this test can be use d) for the duration of  the COVID-19 declaration under Section 564(b)(1) of the Act, 21 U.S.C. section 360bbb-3(b)(1), unless the authorization is terminated or revoked sooner.      Influenza A by PCR NEGATIVE NEGATIVE Final   Influenza B by PCR NEGATIVE NEGATIVE Final    Comment: (NOTE) The Xpert Xpress SARS-CoV-2/FLU/RSV assay is intended as an aid in  the diagnosis of influenza from Nasopharyngeal swab specimens and  should not be used as a sole basis for treatment. Nasal washings and  aspirates are unacceptable for Xpert Xpress SARS-CoV-2/FLU/RSV  testing.  Fact Sheet for Patients: PinkCheek.be  Fact Sheet for Healthcare Providers: GravelBags.it  This test is not yet approved or cleared by the [place] FDA and  has been authorized for detection and/or diagnosis of SARS-CoV-2 by  FDA under an Emergency Use Authorization (EUA). This EUA will remain  in effect (meaning this test can be used) for the duration of the  Covid-19 declaration under Section 564(b)(1) of the Act, 21  U.S.C. section 360bbb-3(b)(1), unless the authorization is  terminated or revoked. Performed at [facility], [address] [place], [place] [address]   Blood culture (routine x 2)     Status: None (Preliminary result)   Collection Time: 09/27/20  10:47 AM   Specimen: BLOOD  Result Value Ref Range Status   Specimen Description BLOOD LEFT ANTECUBITAL  Final   Special Requests   Final    BOTTLES DRAWN AEROBIC AND ANAEROBIC Blood Culture adequate volume   Culture   Final    NO GROWTH 2 DAYS Performed at [facility], [address] [place], [place] [address]    Report Status PENDING  Incomplete  Blood culture (routine x 2)     Status: None (Preliminary result)   Collection Time: 09/27/20 10:47 AM   Specimen: BLOOD  Result Value Ref Range Status   Specimen Description BLOOD BLOOD RIGHT HAND  Final   Special Requests   Final    BOTTLES DRAWN AEROBIC AND  ANAEROBIC Blood Culture adequate volume   Culture   Final    NO GROWTH 2 DAYS Performed at [facility], [address] [place], [place] [address]    Report Status PENDING  Incomplete      Radiology Studies: CT Angio Chest PE W and/or Wo Contrast  Result Date: 09/27/2020 CLINICAL DATA:  Cough, shortness of breath.  COVID-19 positive EXAM: CT ANGIOGRAPHY CHEST WITH CONTRAST TECHNIQUE: Multidetector CT imaging of the chest was performed using the standard protocol during bolus administration of intravenous contrast. Multiplanar CT image reconstructions and MIPs were obtained to evaluate the vascular anatomy. CONTRAST:  50mL OMNIPAQUE IOHEXOL 350 MG/ML SOLN COMPARISON:  Same day chest x-ray FINDINGS: Cardiovascular: Satisfactory opacification of the pulmonary arteries to the segmental level. No evidence of pulmonary embolism. Thoracic aorta is nonaneurysmal. Minimal atherosclerotic calcification of the aortic arch. Coronary artery stent is noted. Normal heart size. No pericardial effusion. Mediastinum/Nodes: Mildly prominent subcarinal and right hilar lymph nodes. No axillary lymphadenopathy. Thyroid, trachea, and esophagus within normal limits. Lungs/Pleura: Patchy posterior right lower lobe airspace consolidation with surrounding ground-glass opacity. Additional  multifocal areas of predominantly ground-glass opacity within the peripheral aspects of both lung fields. No pleural effusion. No pneumothorax. There are a few small scattered calcified granulomas within the left upper lobe. Upper Abdomen: No acute abnormality. Musculoskeletal: No chest wall abnormality. No acute or significant osseous findings. Review of the MIP images confirms the above findings. IMPRESSION: 1. No evidence of pulmonary embolism. 2. Patchy posterior right lower lobe airspace consolidation. Additional multifocal areas of predominantly ground-glass opacity within the peripheral aspects of both lung fields. Findings are suspicious for multifocal pneumonia in the setting of COVID-19 infection. 3. Mildly prominent subcarinal and right hilar lymph nodes, likely reactive. Electronically Signed   By: Davina Poke D.O.   On: 09/27/2020 13:27   DG Chest Port 1 View  Result Date: 09/29/2020 CLINICAL DATA:  Acute hypoxemic respiratory failure due to COVID 19 EXAM: PORTABLE CHEST 1 VIEW COMPARISON:  09/27/2020 FINDINGS: Mild patchy bibasilar airspace disease has progressed in the interval. Upper lobes appear clear. Heart size and vascularity normal. No effusion. IMPRESSION: Progression of patchy bibasilar airspace disease compatible with COVID pneumonia. Electronically Signed   By: Franchot Gallo M.D.   On: 09/29/2020 11:23    Scheduled Meds: . aspirin EC  81 mg Oral Daily  . citalopram  40 mg Oral Daily  . clopidogrel  75 mg Oral Daily  . enoxaparin (LOVENOX) injection  0.5 mg/kg Subcutaneous Q24H  . fluticasone  2 spray Each Nare Daily  . insulin aspart  0-15 Units Subcutaneous TID WC  . methylPREDNISolone (SOLU-MEDROL) injection  125 mg Intravenous Once  . methylPREDNISolone (SOLU-MEDROL) injection  80 mg Intravenous Q12H  . pantoprazole  40 mg Oral Daily  . pravastatin  20 mg Oral QPM  . sodium chloride flush  3 mL Intravenous Q12H   Continuous Infusions: . sodium chloride 250 mL  (09/29/20 0840)  . remdesivir 100 mg in NS 100 mL 100 mg (09/29/20 0842)     LOS: 2 days   Time spent: 35 minutes.  Patrecia Pour, MD [facility] [email or web address] 09/29/2020, 12:40 PM

## 2020-09-29 NOTE — Progress Notes (Signed)
[place] visited pt. per RN suggestion; pt. lying in bed, awake, amenable to visit.  Pt. shared that he and his wife both caught COVID but that she has been able to manage her symptoms at home; pt. feels like he's improving and anticipates discharge in the next few days; pt. concerned re: his sister-in-law who pt. says is in critical condition in [facility] ICU; [place] offered prayer for pt. and wife's recovery and for divine presence and strength for extended family as they cope w/SIL critical condition and possible death.  [place] remains available.

## 2020-09-30 LAB — COMPREHENSIVE METABOLIC PANEL
ALT: 26 U/L (ref 0–44)
AST: 43 U/L — ABNORMAL HIGH (ref 15–41)
Albumin: 3.1 g/dL — ABNORMAL LOW (ref 3.5–5.0)
Alkaline Phosphatase: 51 U/L (ref 38–126)
Anion gap: 11 (ref 5–15)
BUN: 27 mg/dL — ABNORMAL HIGH (ref 8–23)
CO2: 21 mmol/L — ABNORMAL LOW (ref 22–32)
Calcium: 8.8 mg/dL — ABNORMAL LOW (ref 8.9–10.3)
Chloride: 100 mmol/L (ref 98–111)
Creatinine, Ser: 1.33 mg/dL — ABNORMAL HIGH (ref 0.61–1.24)
GFR, Estimated: 56 mL/min — ABNORMAL LOW (ref >60)
Glucose, Bld: 237 mg/dL — ABNORMAL HIGH (ref 70–99)
Potassium: 4.8 mmol/L (ref 3.5–5.1)
Sodium: 132 mmol/L — ABNORMAL LOW (ref 135–145)
Total Bilirubin: 0.7 mg/dL (ref 0.3–1.2)
Total Protein: 7.2 g/dL (ref 6.5–8.1)

## 2020-09-30 LAB — CBC WITH DIFFERENTIAL/PLATELET
Abs Immature Granulocytes: 0.04 10*3/uL (ref 0.00–0.07)
Basophils Absolute: 0 10*3/uL (ref 0.0–0.1)
Basophils Relative: 0 %
Eosinophils Absolute: 0 10*3/uL (ref 0.0–0.5)
Eosinophils Relative: 0 %
HCT: 38.1 % — ABNORMAL LOW (ref 39.0–52.0)
Hemoglobin: 12.6 g/dL — ABNORMAL LOW (ref 13.0–17.0)
Immature Granulocytes: 1 %
Lymphocytes Relative: 13 %
Lymphs Abs: 0.5 10*3/uL — ABNORMAL LOW (ref 0.7–4.0)
MCH: 25.5 pg — ABNORMAL LOW (ref 26.0–34.0)
MCHC: 33.1 g/dL (ref 30.0–36.0)
MCV: 77.1 fL — ABNORMAL LOW (ref 80.0–100.0)
Monocytes Absolute: 0.1 10*3/uL (ref 0.1–1.0)
Monocytes Relative: 2 %
Neutro Abs: 3.4 10*3/uL (ref 1.7–7.7)
Neutrophils Relative %: 84 %
Platelets: 198 10*3/uL (ref 150–400)
RBC: 4.94 MIL/uL (ref 4.22–5.81)
RDW: 13.2 % (ref 11.5–15.5)
WBC: 4.1 10*3/uL (ref 4.0–10.5)
nRBC: 0 % (ref 0.0–0.2)

## 2020-09-30 LAB — GLUCOSE, CAPILLARY: Glucose-Capillary: 219 mg/dL — ABNORMAL HIGH (ref 70–99)

## 2020-09-30 LAB — C-REACTIVE PROTEIN: CRP: 17.5 mg/dL — ABNORMAL HIGH (ref <1.0)

## 2020-09-30 LAB — FIBRIN DERIVATIVES D-DIMER (ARMC ONLY): Fibrin derivatives D-dimer (ARMC): 1731.33 ng/mL (FEU) — ABNORMAL HIGH (ref 0.00–499.00)

## 2020-09-30 MED ORDER — DEXAMETHASONE 6 MG PO TABS: ORAL_TABLET | ORAL | 0 refills | Status: DC

## 2020-09-30 NOTE — Progress Notes (Signed)
Patient scheduled for outpatient Remdesivir infusion at 11am on Sunday 10/17 at [facility]. Please inform the patient to park at [place], as staff will be escorting the patient through the [place] entrance of the hospital. Appointments take approximately 45 minutes.    There is a wave flag banner located near the entrance on [address]. Turn into this entrance and immediately turn left or right and park in 1 of the 10 designated Covid Infusion Parking spots. There is a phone number on the sign, please call and let the staff know what spot you are in and we will come out and get you. For questions call [phone].  Thanks.

## 2020-09-30 NOTE — Discharge Summary (Signed)
Physician Discharge Summary  [name] JJH:[id number] DOB: 1956-03-03 DOA: 09/27/2020  PCP: Eulogio Bear, NP  Admit date: 09/27/2020 Discharge date: 09/30/2020  Admitted From: Home Disposition: Home   Recommendations for Outpatient Follow-up:  1. Follow up with PCP in 1-2 weeks  Home Health: None Equipment/Devices: None, not hypoxic on exertion Discharge Condition: Stable CODE STATUS: Full Diet recommendation: Heart healthy, carb-modified  Brief/Interim Summary: Steven Vang is a 64 y.o. male with a history of CAD, ICM, HFrEF, HTN, HLD, T2DM, obesity and covid-19 diagnosed 10/8 who presented to the ED 10/13 with fever, fatigue, diarrhea, cough, and shortness of breath. He was febrile to 103.71F, not hypoxic at rest. CXR was low volume, d-dimer elevated at 1,792 and subsequent CTA chest showed no PE but revealed consolidative RLL and peripheral GGOs consistent with covid pneumonia. Creatinine was 2 from a baseline of 1.3 indicating AKI. Remdesivir was started, IV fluids were given, diuretic held, and he was admitted. Pulmonary infiltrates progressed somewhat over the following day and hypoxia developed. Steroids were added and with continued treatment, hypoxemia has resolved, respiratory effort is normal, and GI symptoms have resolved. The patient requests discharge this morning and has a good understanding of return precautions. His final dose of remdesivir has been scheduled at the infusion clinic 10/17.  Discharge Diagnoses:  Principal Problem:   AKI (acute kidney injury) ([place]) Active Problems:   Anxiety disorder   Chronic systolic heart failure (HCC)   Coronary artery disease   Essential hypertension   GERD (gastroesophageal reflux disease)   Type 2 diabetes mellitus without complication, without long-term current use of insulin (HCC)  AKI: Due to ATN from hypotension and dehydration. Improved - SCr back to presumed baseline with CrCl >67ml/min.  - Avoid  nephrotoxins and hypotension.   Acute hypoxic respiratory failure due to covid-19 pneumonia: SARS-CoV-2 testing initially positive at [facility] 10/8.  - Continue remdesivir x5 days (10/13 - 10/17). Final dose at [facility] infusion center 10/17.  - Pt became hypoxemic requiring 2L O2, CXR ordered and shows progression of airspace opacities, started IV steroids. CRP remains elevated but has responded appropriately.   - Continue airborne, contact precautions for 21 days from positive testing. - Encouraged to get vaccine after isolation period.   Diarrhea due to covid-19 gastroenteritis: Resolved. - Imodium prn  CAD, ICM, chronic HFrEF, HTN: Most recent LVEF 50%. No angina, troponin wnl. BNP 69.6.  - Appears euvolemic, can restart home medications. - Continue DAPT, statin  T2DM:  - Continue home medication  Obesity: Estimated body mass index is 33.47 kg/m as calculated from the following:   Height as of this encounter: 5\' 11"  (1.803 m).   Weight as of this encounter: 108.9 kg.   Hyponatremia: Hypovolemic, resolved.   Discharge Instructions Discharge Instructions    Diet - low sodium heart healthy   Complete by: As directed    Diet Carb Modified   Complete by: As directed    Discharge instructions   Complete by: As directed    You are being discharged from the hospital after treatment for covid-19 infection. You are felt to be stable enough to no longer require inpatient monitoring, testing, and treatment, though you will need to follow the recommendations below: - Continue taking decadron (steroid) for 6 more days. You will take this twice daily for 4 days, then once daily for the other 2 days. Start tomorrow morning.  - You will need to go to [place] tomorrow to get the  last dose of remdesivir.  - You no longer require supplemental oxygen, so you are stable enough to go home, but still need to return to the ED if your shortness of breath worsens, you develop low  oxygen levels or chest pain. - Per CDC guidelines, you will need to remain in isolation for 21 days from your first positive covid test. - Follow up with your doctor in the next week via telehealth or seek medical attention right away if your symptoms get [place] are still encouraged to get a covid vaccination between 21 days (after isolation period ends) and 90 days (before immunity is thought to wear off).  Directions for you at home:  Wear a facemask You should wear a facemask that covers your nose and mouth when you are in the same room with other people and when you visit a healthcare provider. People who live with or visit you should also wear a facemask while they are in the same room with you.  Separate yourself from other people in your home As much as possible, you should stay in a different room from other people in your home. Also, you should use a separate bathroom, if available.  Avoid sharing household items You should not share dishes, drinking glasses, cups, eating utensils, towels, bedding, or other items with other people in your home. After using these items, you should wash them thoroughly with soap and water.  Cover your coughs and sneezes Cover your mouth and nose with a tissue when you cough or sneeze, or you can cough or sneeze into your sleeve. Throw used tissues in a lined trash can, and immediately wash your hands with soap and water for at least 20 seconds or use an alcohol-based hand rub.  Wash your [facility] your hands often and thoroughly with soap and water for at least 20 seconds. You can use an alcohol-based hand sanitizer if soap and water are not available and if your hands are not visibly dirty. Avoid touching your eyes, nose, and mouth with unwashed hands.  Directions for those who live with, or provide care at home for you:  Limit the number of people who have contact with the patient If possible, have only one caregiver for the  patient. Other household members should stay in another home or place of residence. If this is not possible, they should stay in another room, or be separated from the patient as much as possible. Use a separate bathroom, if available. Restrict visitors who do not have an essential need to be in the home.  Ensure good ventilation Make sure that shared spaces in the home have good air flow, such as from an air conditioner or an opened window, weather permitting.  Wash your hands often Wash your hands often and thoroughly with soap and water for at least 20 seconds. You can use an alcohol based hand sanitizer if soap and water are not available and if your hands are not visibly dirty. Avoid touching your eyes, nose, and mouth with unwashed hands. Use disposable paper towels to dry your hands. If not available, use dedicated cloth towels and replace them when they become wet.  Wear a facemask and gloves Wear a disposable facemask at all times in the room and gloves when you touch or have contact with the patient's blood, body fluids, and/or secretions or excretions, such as sweat, saliva, sputum, nasal mucus, vomit, urine, or feces.  Ensure the mask fits over your nose and  mouth tightly, and do not touch it during use. Throw out disposable facemasks and gloves after using them. Do not reuse. Wash your hands immediately after removing your facemask and gloves. If your personal clothing becomes contaminated, carefully remove clothing and launder. Wash your hands after handling contaminated clothing. Place all used disposable facemasks, gloves, and other waste in a lined container before disposing them with other household waste. Remove gloves and wash your hands immediately after handling these items.  Do not share dishes, glasses, or other household items with the patient Avoid sharing household items. You should not share dishes, drinking glasses, cups, eating utensils, towels, bedding, or other  items with a patient who is confirmed to have, or being evaluated for, COVID-19 infection. After the person uses these items, you should wash them thoroughly with soap and water.  Wash laundry thoroughly Immediately remove and wash clothes or bedding that have blood, body fluids, and/or secretions or excretions, such as sweat, saliva, sputum, nasal mucus, vomit, urine, or feces, on them. Wear gloves when handling laundry from the patient. Read and follow directions on labels of laundry or clothing items and detergent. In general, wash and dry with the warmest temperatures recommended on the label.  Clean all areas the individual has used often Clean all touchable surfaces, such as counters, tabletops, doorknobs, bathroom fixtures, toilets, phones, keyboards, tablets, and bedside tables, every day. Also, clean any surfaces that may have blood, body fluids, and/or secretions or excretions on them. Wear gloves when cleaning surfaces the patient has come in contact with. Use a diluted bleach solution (e.g., dilute bleach with 1 part bleach and 10 parts water) or a household disinfectant with a label that says EPA-registered for coronaviruses. To make a bleach solution at home, add 1 tablespoon of bleach to 1 quart (4 cups) of water. For a larger supply, add  cup of bleach to 1 gallon (16 cups) of water. Read labels of cleaning products and follow recommendations provided on product labels. Labels contain instructions for safe and effective use of the cleaning product including precautions you should take when applying the product, such as wearing gloves or eye protection and making sure you have good ventilation during use of the product. Remove gloves and wash hands immediately after cleaning.  Monitor yourself for signs and symptoms of illness Caregivers and household members are considered close contacts, should monitor their health, and will be asked to limit movement outside of the home to the  extent possible. Follow the monitoring steps for close contacts listed on the symptom monitoring form.  If you have additional questions, contact your local health department or call the epidemiologist on call at [phone] (available 24/7). This guidance is subject to change. For the most up-to-date guidance from CDC, please refer to their website: YouBlogs.pl     Allergies as of 09/30/2020   No Known Allergies     Medication List    STOP taking these medications   oxyCODONE 5 MG immediate release tablet Commonly known as: Roxicodone     TAKE these medications   aspirin 81 MG EC tablet Take 1 tablet by mouth daily.   citalopram 40 MG tablet Commonly known as: CELEXA Take 1 tablet (40 mg total) by mouth daily.   clopidogrel 75 MG tablet Commonly known as: PLAVIX Take 1 tablet (75 mg total) by mouth daily.   dexamethasone 6 MG tablet Commonly known as: Decadron Take 1 tablet (6 mg total) by mouth 2 (two) times daily for 4 days,  THEN 1 tablet (6 mg total) daily for 2 days. Start taking on: September 30, 2020   famotidine 20 MG tablet Commonly known as: PEPCID Take 1 tablet (20 mg total) by mouth 2 (two) times daily.   fluticasone 50 MCG/ACT nasal spray Commonly known as: FLONASE Place 2 sprays into both nostrils daily.   furosemide 40 MG tablet Commonly known as: LASIX Take 40 mg by mouth daily.   gabapentin 300 MG capsule Commonly known as: NEURONTIN Take 1-2 capsules (300-600 mg total) by mouth at bedtime as needed. What changed: how much to take   isosorbide mononitrate 30 MG 24 hr tablet Commonly known as: IMDUR Take 30 mg by mouth daily.   losartan 50 MG tablet Commonly known as: COZAAR Take 1 tablet (50 mg total) by mouth daily.   metFORMIN 1000 MG tablet Commonly known as: GLUCOPHAGE Take 1 tablet (1,000 mg total) by mouth 2 (two) times daily with a meal.   metoprolol succinate 50 MG  24 hr tablet Commonly known as: TOPROL-XL Take 50 mg by mouth daily.   pantoprazole 40 MG tablet Commonly known as: PROTONIX Take 1 tablet (40 mg total) by mouth daily.   pravastatin 40 MG tablet Commonly known as: PRAVACHOL Take 0.5 tablets (20 mg total) by mouth every evening.   traZODone 50 MG tablet Commonly known as: DESYREL Take 1 tablet (50 mg total) by mouth at bedtime. PRN       Follow-up Information    Eulogio Bear, NP. Schedule an appointment as soon as possible for a visit in 2 week(s).   Specialty: Nurse Practitioner Contact information: [place] [address] [phone]              No Known Allergies  Consultations:  None  Procedures/Studies: CT Angio Chest PE W and/or Wo Contrast  Result Date: 09/27/2020 CLINICAL DATA:  Cough, shortness of breath.  COVID-19 positive EXAM: CT ANGIOGRAPHY CHEST WITH CONTRAST TECHNIQUE: Multidetector CT imaging of the chest was performed using the standard protocol during bolus administration of intravenous contrast. Multiplanar CT image reconstructions and MIPs were obtained to evaluate the vascular anatomy. CONTRAST:  46mL OMNIPAQUE IOHEXOL 350 MG/ML SOLN COMPARISON:  Same day chest x-ray FINDINGS: Cardiovascular: Satisfactory opacification of the pulmonary arteries to the segmental level. No evidence of pulmonary embolism. Thoracic aorta is nonaneurysmal. Minimal atherosclerotic calcification of the aortic arch. Coronary artery stent is noted. Normal heart size. No pericardial effusion. Mediastinum/Nodes: Mildly prominent subcarinal and right hilar lymph nodes. No axillary lymphadenopathy. Thyroid, trachea, and esophagus within normal limits. Lungs/Pleura: Patchy posterior right lower lobe airspace consolidation with surrounding ground-glass opacity. Additional multifocal areas of predominantly ground-glass opacity within the peripheral aspects of both lung fields. No pleural effusion. No  pneumothorax. There are a few small scattered calcified granulomas within the left upper lobe. Upper Abdomen: No acute abnormality. Musculoskeletal: No chest wall abnormality. No acute or significant osseous findings. Review of the MIP images confirms the above findings. IMPRESSION: 1. No evidence of pulmonary embolism. 2. Patchy posterior right lower lobe airspace consolidation. Additional multifocal areas of predominantly ground-glass opacity within the peripheral aspects of both lung fields. Findings are suspicious for multifocal pneumonia in the setting of COVID-19 infection. 3. Mildly prominent subcarinal and right hilar lymph nodes, likely reactive. Electronically Signed   By: Davina Poke D.O.   On: 09/27/2020 13:27   DG Chest Port 1 View  Result Date: 09/29/2020 CLINICAL DATA:  Acute hypoxemic respiratory failure due to COVID 19  EXAM: PORTABLE CHEST 1 VIEW COMPARISON:  09/27/2020 FINDINGS: Mild patchy bibasilar airspace disease has progressed in the interval. Upper lobes appear clear. Heart size and vascularity normal. No effusion. IMPRESSION: Progression of patchy bibasilar airspace disease compatible with COVID pneumonia. Electronically Signed   By: Franchot Gallo M.D.   On: 09/29/2020 11:23   DG Chest Port 1 View  Result Date: 09/27/2020 CLINICAL DATA:  Possible sepsis. COVID positive with shortness of breath, body aches and cough. EXAM: PORTABLE CHEST 1 VIEW COMPARISON:  Chest radiograph 07/01/2014 FINDINGS: Low lung volumes, which limits evaluation. No focal consolidation. Cardiac silhouette is within normal limits and mildly accentuated by low lung volumes and portable technique. No visible pleural effusions pneumothorax. No acute osseous abnormality. IMPRESSION: No evidence of acute cardiopulmonary disease on this AP portable radiograph with low lung volumes. Electronically Signed   By: Margaretha Sheffield MD   On: 09/27/2020 11:02       Subjective: Ambulated in room extensively  this morning on room air without dyspnea and SpO2 not getting below 90%. Very eager to go home, has RN who will care for him there, understands return precautions. No chest pain, leg swelling. Eating well, no further diarrhea.   Discharge Exam: Vitals:   09/30/20 0520 09/30/20 0734  BP: (!) 155/88 140/89  Pulse: 77 67  Resp: 16 18  Temp: (!) 97.5 F (36.4 C) 98 F (36.7 C)  SpO2: 93% 97%   General: Pt is alert, awake, not in acute distress Cardiovascular: RRR, S1/S2 +, no rubs, no gallops Respiratory: CTA bilaterally, no wheezing, no rhonchi Abdominal: Soft, NT, ND, bowel sounds + Extremities: No edema, no cyanosis  Labs: BNP (last 3 results) Recent Labs    09/27/20 1041  BNP 37.6   Basic Metabolic Panel: Recent Labs  Lab 09/27/20 1041 09/28/20 0441 09/29/20 0643 09/30/20 0415  NA 133* 137 132* 132*  K 4.3 4.2 4.4 4.8  CL 98 103 99 100  CO2 22 25 23  21*  GLUCOSE 133* 112* 107* 237*  BUN 25* 21 24* 27*  CREATININE 2.00* 1.39* 1.42* 1.33*  CALCIUM 8.7* 8.4* 8.5* 8.8*  MG 2.0 2.1  --   --   PHOS  --  4.0  --   --    Liver Function Tests: Recent Labs  Lab 09/27/20 1041 09/28/20 0441 09/29/20 0643 09/30/20 0415  AST 35 41 51* 43*  ALT 16 19 24 26   ALKPHOS 49 45 51 51  BILITOT 1.1 0.7 0.8 0.7  PROT 7.7 6.9 7.3 7.2  ALBUMIN 3.7 3.2* 3.3* 3.1*   No results for input(s): LIPASE, AMYLASE in the last 168 hours. No results for input(s): AMMONIA in the last 168 hours. CBC: Recent Labs  Lab 09/27/20 1041 09/28/20 0441 09/29/20 0643 09/30/20 0415  WBC 9.3 6.2 6.7 4.1  NEUTROABS 7.7 4.7 5.0 3.4  HGB 13.2 12.2* 12.2* 12.6*  HCT 40.2 36.4* 37.1* 38.1*  MCV 78.5* 78.6* 79.1* 77.1*  PLT 137* 127* 187 198   Cardiac Enzymes: No results for input(s): CKTOTAL, CKMB, CKMBINDEX, TROPONINI in the last 168 hours. BNP: Invalid input(s): POCBNP CBG: Recent Labs  Lab 09/29/20 0814 09/29/20 1201 09/29/20 1728 09/29/20 2140 09/30/20 0810  GLUCAP 93 76 181* 199*  219*   D-Dimer No results for input(s): DDIMER in the last 72 hours. Hgb A1c No results for input(s): HGBA1C in the last 72 hours. Lipid Profile No results for input(s): CHOL, HDL, LDLCALC, TRIG, CHOLHDL, LDLDIRECT in the last 72 hours. Thyroid function  studies No results for input(s): TSH, T4TOTAL, T3FREE, THYROIDAB in the last 72 hours.  Invalid input(s): FREET3 Anemia work up [employer]    09/28/20 0441  FERRITIN 342*   Urinalysis No results found for: COLORURINE, APPEARANCEUR, LABSPEC, [place], [place], [place], [place], [place], [place], UROBILINOGEN, NITRITE, [place]  Microbiology Recent Results (from the past 240 hour(s))  Respiratory Panel by RT PCR (Flu A&B, Covid) - Nasopharyngeal Swab     Status: Abnormal   Collection Time: 09/27/20 10:05 AM   Specimen: Nasopharyngeal Swab  Result Value Ref Range Status   SARS Coronavirus 2 by RT PCR POSITIVE (A) NEGATIVE Final    Comment: RESULT CALLED TO, READ BACK BY AND VERIFIED WITH: [facility] REGISTER 09/27/20 1241 KLW (NOTE) SARS-CoV-2 target nucleic acids are DETECTED.  SARS-CoV-2 RNA is generally detectable in upper respiratory specimens  during the acute phase of infection. Positive results are indicative of the presence of the identified virus, but do not rule out bacterial infection or co-infection with other pathogens not detected by the test. Clinical correlation with patient history and other diagnostic information is necessary to determine patient infection status. The expected result is Negative.  Fact Sheet for Patients:  PinkCheek.be  Fact Sheet for Healthcare Providers: GravelBags.it  This test is not yet approved or cleared by the [place] FDA and  has been authorized for detection and/or diagnosis of SARS-CoV-2 by FDA under an Emergency Use Authorization (EUA).  This EUA will remain in effect (meaning this test can be use d) for the  duration of  the COVID-19 declaration under Section 564(b)(1) of the Act, 21 U.S.C. section 360bbb-3(b)(1), unless the authorization is terminated or revoked sooner.      Influenza A by PCR NEGATIVE NEGATIVE Final   Influenza B by PCR NEGATIVE NEGATIVE Final    Comment: (NOTE) The Xpert Xpress SARS-CoV-2/FLU/RSV assay is intended as an aid in  the diagnosis of influenza from Nasopharyngeal swab specimens and  should not be used as a sole basis for treatment. Nasal washings and  aspirates are unacceptable for Xpert Xpress SARS-CoV-2/FLU/RSV  testing.  Fact Sheet for Patients: PinkCheek.be  Fact Sheet for Healthcare Providers: GravelBags.it  This test is not yet approved or cleared by the [place] FDA and  has been authorized for detection and/or diagnosis of SARS-CoV-2 by  FDA under an Emergency Use Authorization (EUA). This EUA will remain  in effect (meaning this test can be used) for the duration of the  Covid-19 declaration under Section 564(b)(1) of the Act, 21  U.S.C. section 360bbb-3(b)(1), unless the authorization is  terminated or revoked. Performed at [facility], [address] [place], [place] [address]   Blood culture (routine x 2)     Status: None (Preliminary result)   Collection Time: 09/27/20 10:47 AM   Specimen: BLOOD  Result Value Ref Range Status   Specimen Description BLOOD LEFT ANTECUBITAL  Final   Special Requests   Final    BOTTLES DRAWN AEROBIC AND ANAEROBIC Blood Culture adequate volume   Culture   Final    NO GROWTH 3 DAYS Performed at [facility], [address], [place], [place] [address]    Report Status PENDING  Incomplete  Blood culture (routine x 2)     Status: None (Preliminary result)   Collection Time: 09/27/20 10:47 AM   Specimen: BLOOD  Result Value Ref Range Status   Specimen Description BLOOD BLOOD RIGHT HAND  Final   Special Requests   Final  BOTTLES DRAWN AEROBIC AND ANAEROBIC Blood Culture adequate volume   Culture   Final    NO GROWTH 3 DAYS Performed at [facility], [address], [place], [place] [address]    Report Status PENDING  Incomplete    Time coordinating discharge: Approximately 40 minutes  Patrecia Pour, MD  [facility] 09/30/2020, 10:33 AM

## 2020-09-30 NOTE — Progress Notes (Signed)
SATURATION QUALIFICATIONS: (This note is used to comply with regulatory documentation for home oxygen)  Patient Saturations on Room Air at Rest = 96%  Patient Saturations on Room Air while Ambulating = 90%  Patient Saturations on 2 Liters of oxygen while Ambulating = 95%  Please briefly explain why patient needs home oxygen:

## 2020-09-30 NOTE — Discharge Instructions (Signed)
Patient scheduled for outpatient Remdesivir infusion at 11am on Sunday 10/17 at [facility]. Please inform the patient to park at [place], as staff will be escorting the patient through the [place] entrance of the hospital. Appointments take approximately 45 minutes.    There is a wave flag banner located near the entrance on [address]. Turn into this entrance and immediately turn left or right and park in 1 of the 10 designated Covid Infusion Parking spots. There is a phone number on the sign, please call and let the staff know what spot you are in and we will come out and get you. For questions call [phone].  Thanks.

## 2020-10-01 ENCOUNTER — Inpatient Hospital Stay
Admission: EM | Admit: 2020-10-01 | Discharge: 2020-10-05 | DRG: 177 | Disposition: A | Discharge status: Home / Self Care | Payer: 59 | Attending: Internal Medicine | Admitting: Internal Medicine

## 2020-10-01 ENCOUNTER — Other Ambulatory Visit: Payer: Self-pay

## 2020-10-01 ENCOUNTER — Ambulatory Visit (HOSPITAL_COMMUNITY): Payer: 59

## 2020-10-01 ENCOUNTER — Emergency Department: Payer: 59

## 2020-10-01 DIAGNOSIS — E860 Dehydration: Secondary | ICD-10-CM | POA: Diagnosis present

## 2020-10-01 DIAGNOSIS — N179 Acute kidney failure, unspecified: Secondary | ICD-10-CM | POA: Diagnosis present

## 2020-10-01 DIAGNOSIS — Z833 Family history of diabetes mellitus: Secondary | ICD-10-CM

## 2020-10-01 DIAGNOSIS — F411 Generalized anxiety disorder: Secondary | ICD-10-CM

## 2020-10-01 DIAGNOSIS — Z800 Family history of malignant neoplasm of digestive organs: Secondary | ICD-10-CM

## 2020-10-01 DIAGNOSIS — Z6837 Body mass index (BMI) 37.0-37.9, adult: Secondary | ICD-10-CM

## 2020-10-01 DIAGNOSIS — U071 COVID-19: Principal | ICD-10-CM

## 2020-10-01 DIAGNOSIS — Z87891 Personal history of nicotine dependence: Secondary | ICD-10-CM

## 2020-10-01 DIAGNOSIS — K219 Gastro-esophageal reflux disease without esophagitis: Secondary | ICD-10-CM | POA: Diagnosis present

## 2020-10-01 DIAGNOSIS — I5022 Chronic systolic (congestive) heart failure: Secondary | ICD-10-CM

## 2020-10-01 DIAGNOSIS — E1165 Type 2 diabetes mellitus with hyperglycemia: Secondary | ICD-10-CM | POA: Diagnosis present

## 2020-10-01 DIAGNOSIS — E782 Mixed hyperlipidemia: Secondary | ICD-10-CM | POA: Diagnosis present

## 2020-10-01 DIAGNOSIS — Z955 Presence of coronary angioplasty implant and graft: Secondary | ICD-10-CM

## 2020-10-01 DIAGNOSIS — Z8249 Family history of ischemic heart disease and other diseases of the circulatory system: Secondary | ICD-10-CM

## 2020-10-01 DIAGNOSIS — Z803 Family history of malignant neoplasm of breast: Secondary | ICD-10-CM

## 2020-10-01 DIAGNOSIS — E871 Hypo-osmolality and hyponatremia: Secondary | ICD-10-CM | POA: Diagnosis present

## 2020-10-01 DIAGNOSIS — J1282 Pneumonia due to coronavirus disease 2019: Secondary | ICD-10-CM | POA: Diagnosis not present

## 2020-10-01 DIAGNOSIS — R001 Bradycardia, unspecified: Secondary | ICD-10-CM | POA: Diagnosis present

## 2020-10-01 DIAGNOSIS — I255 Ischemic cardiomyopathy: Secondary | ICD-10-CM | POA: Diagnosis present

## 2020-10-01 DIAGNOSIS — E119 Type 2 diabetes mellitus without complications: Secondary | ICD-10-CM

## 2020-10-01 DIAGNOSIS — I2510 Atherosclerotic heart disease of native coronary artery without angina pectoris: Secondary | ICD-10-CM | POA: Diagnosis present

## 2020-10-01 DIAGNOSIS — I959 Hypotension, unspecified: Secondary | ICD-10-CM | POA: Diagnosis present

## 2020-10-01 DIAGNOSIS — Z7902 Long term (current) use of antithrombotics/antiplatelets: Secondary | ICD-10-CM

## 2020-10-01 DIAGNOSIS — T380X5A Adverse effect of glucocorticoids and synthetic analogues, initial encounter: Secondary | ICD-10-CM | POA: Diagnosis present

## 2020-10-01 DIAGNOSIS — Z79899 Other long term (current) drug therapy: Secondary | ICD-10-CM

## 2020-10-01 DIAGNOSIS — F32A Depression, unspecified: Secondary | ICD-10-CM | POA: Diagnosis present

## 2020-10-01 DIAGNOSIS — E669 Obesity, unspecified: Secondary | ICD-10-CM | POA: Diagnosis present

## 2020-10-01 DIAGNOSIS — I110 Hypertensive heart disease with heart failure: Secondary | ICD-10-CM | POA: Diagnosis present

## 2020-10-01 DIAGNOSIS — F419 Anxiety disorder, unspecified: Secondary | ICD-10-CM | POA: Diagnosis present

## 2020-10-01 DIAGNOSIS — J9601 Acute respiratory failure with hypoxia: Secondary | ICD-10-CM | POA: Diagnosis present

## 2020-10-01 DIAGNOSIS — Z7984 Long term (current) use of oral hypoglycemic drugs: Secondary | ICD-10-CM

## 2020-10-01 DIAGNOSIS — J9600 Acute respiratory failure, unspecified whether with hypoxia or hypercapnia: Secondary | ICD-10-CM | POA: Diagnosis not present

## 2020-10-01 DIAGNOSIS — F330 Major depressive disorder, recurrent, mild: Secondary | ICD-10-CM | POA: Diagnosis not present

## 2020-10-01 DIAGNOSIS — Z7952 Long term (current) use of systemic steroids: Secondary | ICD-10-CM

## 2020-10-01 DIAGNOSIS — E1140 Type 2 diabetes mellitus with diabetic neuropathy, unspecified: Secondary | ICD-10-CM

## 2020-10-01 DIAGNOSIS — Z7982 Long term (current) use of aspirin: Secondary | ICD-10-CM | POA: Diagnosis not present

## 2020-10-01 LAB — BASIC METABOLIC PANEL
Anion gap: 11 (ref 5–15)
BUN: 40 mg/dL — ABNORMAL HIGH (ref 8–23)
CO2: 22 mmol/L (ref 22–32)
Calcium: 8.9 mg/dL (ref 8.9–10.3)
Chloride: 100 mmol/L (ref 98–111)
Creatinine, Ser: 1.57 mg/dL — ABNORMAL HIGH (ref 0.61–1.24)
GFR, Estimated: 46 mL/min — ABNORMAL LOW (ref >60)
Glucose, Bld: 250 mg/dL — ABNORMAL HIGH (ref 70–99)
Potassium: 4.3 mmol/L (ref 3.5–5.1)
Sodium: 133 mmol/L — ABNORMAL LOW (ref 135–145)

## 2020-10-01 LAB — TROPONIN I (HIGH SENSITIVITY)
Troponin I (High Sensitivity): 8 ng/L (ref <18)
Troponin I (High Sensitivity): 6 ng/L (ref <18)

## 2020-10-01 LAB — GLUCOSE, CAPILLARY
Glucose-Capillary: 204 mg/dL — ABNORMAL HIGH (ref 70–99)
Glucose-Capillary: 169 mg/dL — ABNORMAL HIGH (ref 70–99)
Glucose-Capillary: 169 mg/dL — ABNORMAL HIGH (ref 70–99)

## 2020-10-01 LAB — CBC
HCT: 36.2 % — ABNORMAL LOW (ref 39.0–52.0)
Hemoglobin: 12.2 g/dL — ABNORMAL LOW (ref 13.0–17.0)
MCH: 25.4 pg — ABNORMAL LOW (ref 26.0–34.0)
MCHC: 33.7 g/dL (ref 30.0–36.0)
MCV: 75.3 fL — ABNORMAL LOW (ref 80.0–100.0)
Platelets: 310 10*3/uL (ref 150–400)
RBC: 4.81 MIL/uL (ref 4.22–5.81)
RDW: 13.2 % (ref 11.5–15.5)
WBC: 14.7 10*3/uL — ABNORMAL HIGH (ref 4.0–10.5)
nRBC: 0 % (ref 0.0–0.2)

## 2020-10-01 MED ORDER — PRAVASTATIN SODIUM 20 MG PO TABS
20.0000 mg | ORAL_TABLET | Freq: Every evening | ORAL | Status: DC
  Administered 2020-10-01 – 2020-10-04 (×4): 20 mg via ORAL
  Filled 2020-10-01 (×5): qty 1

## 2020-10-01 MED ORDER — CITALOPRAM HYDROBROMIDE 20 MG PO TABS
40.0000 mg | ORAL_TABLET | Freq: Every day | ORAL | Status: DC
  Administered 2020-10-02 – 2020-10-05 (×4): 40 mg via ORAL
  Filled 2020-10-01 (×4): qty 2

## 2020-10-01 MED ORDER — TRAZODONE HCL 50 MG PO TABS
50.0000 mg | ORAL_TABLET | Freq: Every day | ORAL | Status: DC
  Administered 2020-10-01 – 2020-10-04 (×4): 50 mg via ORAL
  Filled 2020-10-01 (×4): qty 1

## 2020-10-01 MED ORDER — FLUTICASONE PROPIONATE 50 MCG/ACT NA SUSP
2.0000 | Freq: Every day | NASAL | Status: DC
  Administered 2020-10-02 – 2020-10-05 (×4): 2 via NASAL
  Filled 2020-10-01: qty 16

## 2020-10-01 MED ORDER — ONDANSETRON HCL 4 MG PO TABS: 4.0000 mg | ORAL_TABLET | Freq: Four times a day (QID) | ORAL | Status: DC | PRN

## 2020-10-01 MED ORDER — SODIUM CHLORIDE 0.9 % IV SOLN
100.0000 mg | Freq: Once | INTRAVENOUS | Status: AC
  Administered 2020-10-01: 100 mg via INTRAVENOUS
  Filled 2020-10-01: qty 20

## 2020-10-01 MED ORDER — SODIUM CHLORIDE 0.9 % IV SOLN: Freq: Once | INTRAVENOUS | Status: DC

## 2020-10-01 MED ORDER — GUAIFENESIN-DM 100-10 MG/5ML PO SYRP
10.0000 mL | ORAL_SOLUTION | ORAL | Status: DC | PRN
  Administered 2020-10-01 – 2020-10-04 (×5): 10 mL via ORAL
  Filled 2020-10-01 (×6): qty 10

## 2020-10-01 MED ORDER — ASCORBIC ACID 500 MG PO TABS
500.0000 mg | ORAL_TABLET | Freq: Every day | ORAL | Status: DC
  Administered 2020-10-01 – 2020-10-05 (×5): 500 mg via ORAL
  Filled 2020-10-01 (×4): qty 1

## 2020-10-01 MED ORDER — ENOXAPARIN SODIUM 60 MG/0.6ML ~~LOC~~ SOLN
0.5000 mg/kg | SUBCUTANEOUS | Status: DC
  Administered 2020-10-01 – 2020-10-04 (×4): 60 mg via SUBCUTANEOUS
  Filled 2020-10-01 (×6): qty 0.6

## 2020-10-01 MED ORDER — INSULIN DETEMIR 100 UNIT/ML ~~LOC~~ SOLN
0.1500 [IU]/kg | Freq: Two times a day (BID) | SUBCUTANEOUS | Status: DC
  Administered 2020-10-01 – 2020-10-05 (×8): 18 [IU] via SUBCUTANEOUS
  Filled 2020-10-01 (×10): qty 0.18

## 2020-10-01 MED ORDER — SODIUM CHLORIDE 0.9 % IV BOLUS
1000.0000 mL | Freq: Once | INTRAVENOUS | Status: AC
  Administered 2020-10-01: 1000 mL via INTRAVENOUS

## 2020-10-01 MED ORDER — CLOPIDOGREL BISULFATE 75 MG PO TABS
75.0000 mg | ORAL_TABLET | Freq: Every day | ORAL | Status: DC
  Administered 2020-10-01 – 2020-10-04 (×4): 75 mg via ORAL
  Filled 2020-10-01 (×4): qty 1

## 2020-10-01 MED ORDER — DEXAMETHASONE 4 MG PO TABS
6.0000 mg | ORAL_TABLET | ORAL | Status: DC
  Administered 2020-10-01 – 2020-10-04 (×4): 6 mg via ORAL
  Filled 2020-10-01: qty 2
  Filled 2020-10-01: qty 1.5
  Filled 2020-10-01 (×3): qty 2

## 2020-10-01 MED ORDER — ASPIRIN EC 81 MG PO TBEC
81.0000 mg | DELAYED_RELEASE_TABLET | Freq: Every day | ORAL | Status: DC
  Administered 2020-10-02 – 2020-10-05 (×4): 81 mg via ORAL
  Filled 2020-10-01 (×4): qty 1

## 2020-10-01 MED ORDER — SODIUM CHLORIDE 0.9 % IV SOLN: INTRAVENOUS | Status: DC

## 2020-10-01 MED ORDER — INSULIN ASPART 100 UNIT/ML ~~LOC~~ SOLN
0.0000 [IU] | Freq: Three times a day (TID) | SUBCUTANEOUS | Status: DC
  Administered 2020-10-01 – 2020-10-03 (×3): 4 [IU] via SUBCUTANEOUS
  Administered 2020-10-03 – 2020-10-04 (×3): 3 [IU] via SUBCUTANEOUS
  Administered 2020-10-04 (×2): 7 [IU] via SUBCUTANEOUS
  Administered 2020-10-05: 3 [IU] via SUBCUTANEOUS
  Administered 2020-10-05: 10:00:00 7 [IU] via SUBCUTANEOUS
  Filled 2020-10-01 (×10): qty 1

## 2020-10-01 MED ORDER — GABAPENTIN 300 MG PO CAPS
600.0000 mg | ORAL_CAPSULE | Freq: Every evening | ORAL | Status: DC | PRN
  Administered 2020-10-04: 21:00:00 600 mg via ORAL
  Filled 2020-10-01: qty 2

## 2020-10-01 MED ORDER — INSULIN ASPART 100 UNIT/ML ~~LOC~~ SOLN
6.0000 [IU] | Freq: Once | SUBCUTANEOUS | Status: AC
  Administered 2020-10-01: 6 [IU] via INTRAVENOUS
  Filled 2020-10-01: qty 1

## 2020-10-01 MED ORDER — ONDANSETRON HCL 4 MG/2ML IJ SOLN: 4.0000 mg | Freq: Four times a day (QID) | INTRAMUSCULAR | Status: DC | PRN

## 2020-10-01 MED ORDER — ZINC SULFATE 220 (50 ZN) MG PO CAPS
220.0000 mg | ORAL_CAPSULE | Freq: Every day | ORAL | Status: DC
  Administered 2020-10-01 – 2020-10-05 (×5): 220 mg via ORAL
  Filled 2020-10-01 (×4): qty 1

## 2020-10-01 MED ORDER — ACETAMINOPHEN 325 MG PO TABS
650.0000 mg | ORAL_TABLET | Freq: Four times a day (QID) | ORAL | Status: DC | PRN
  Administered 2020-10-05: 650 mg via ORAL
  Filled 2020-10-01: qty 2

## 2020-10-01 MED ORDER — DEXAMETHASONE 4 MG PO TABS: 6.0000 mg | ORAL_TABLET | Freq: Every day | ORAL | Status: DC

## 2020-10-01 MED ORDER — FAMOTIDINE 20 MG PO TABS
20.0000 mg | ORAL_TABLET | Freq: Two times a day (BID) | ORAL | Status: DC
  Administered 2020-10-01 – 2020-10-05 (×8): 20 mg via ORAL
  Filled 2020-10-01 (×8): qty 1

## 2020-10-01 NOTE — ED Triage Notes (Signed)
Pt to ED via POV c/o COVID+, SHOB. Denies CP.  States d/c from hospital yesterday.  States CBG 300s.  Pt speaking in complete sentences. Unlabored breathing.

## 2020-10-01 NOTE — ED Provider Notes (Signed)
[facility] Emergency Department Provider Note    First MD Initiated Contact with Patient 10/01/20 1124     (approximate)  I have reviewed the triage vital signs and the nursing notes.   HISTORY  Chief Complaint COVID + and Shortness of Breath    HPI Steven Vang is a 64 y.o. male presents to the ER for evaluation of generalized malaise as well as concern over elevated blood sugar.  Patient recently released yesterday from hospital for admission for COVID-19.  States he did lose taste and smell and is had decreased p.o. intake.  Felt lightheaded with ambulation today.  Was not discharged home on oxygen.  Is due for his last dose of remdesivir.  States has been compliant with his Metformin.    Past Medical History:  Diagnosis Date  . Abnormal EKG   . Anxiety disorder   . CAD (coronary artery disease)   . CHF (congestive heart failure) ([place])   . Chronic systolic heart failure ([place])   . Depression   . Diabetes mellitus ([place])   . GERD (gastroesophageal reflux disease)   . Hypertension   . Lipoma of head 03/31/2020  . Mixed hyperlipidemia   . Non morbid obesity due to excess calories   . Type 2 diabetes mellitus without complication, without long-term current use of insulin (HCC)    Family History  Problem Relation Age of Onset  . CAD Mother   . Diabetes Mother   . Colon cancer Father   . Breast cancer Paternal Grandmother   . Stomach cancer Paternal Grandfather    Past Surgical History:  Procedure Laterality Date  . ANKLE FRACTURE SURGERY Right   . CARDIAC CATHETERIZATION    . CHOLECYSTECTOMY    . CORONARY STENT PLACEMENT  2015   3 stents  . HAND SURGERY Right   . LEFT HEART CATH AND CORONARY ANGIOGRAPHY Left 06/02/2020   Procedure: LEFT HEART CATH AND CORONARY ANGIOGRAPHY;  Surgeon: Dionisio David, MD;  Location: [facility];  Service: Cardiovascular;  Laterality: Left;   Patient Active Problem List   Diagnosis Date Noted  .  AKI (acute kidney injury) ([place]) 09/27/2020  . History of tobacco use 06/23/2020  . Cough 06/23/2020  . Unstable angina ([place]) 05/19/2020  . Lipoma of head 03/31/2020  . Mild episode of recurrent major depressive disorder ([place]) 01/05/2016  . Chronic systolic heart failure ([place]) 01/13/2015  . Coronary artery disease 01/13/2015  . Essential hypertension 01/13/2015  . Left ventricular dysfunction 01/13/2015  . Type 2 diabetes mellitus without complication, without long-term current use of insulin ([place]) 01/13/2015  . Anxiety disorder 12/20/2014  . GERD (gastroesophageal reflux disease) 12/20/2014  . Mixed hyperlipidemia 12/20/2014  . Non morbid obesity due to excess calories 12/20/2014      Prior to Admission medications   Medication Sig Start Date End Date Taking? Authorizing Provider  aspirin 81 MG EC tablet Take 1 tablet by mouth daily.    [provider]  citalopram (CELEXA) 40 MG tablet Take 1 tablet (40 mg total) by mouth daily. 06/23/20   Eulogio Bear, NP  clopidogrel (PLAVIX) 75 MG tablet Take 1 tablet (75 mg total) by mouth daily. 06/23/20   Eulogio Bear, NP  dexamethasone (DECADRON) 6 MG tablet Take 1 tablet (6 mg total) by mouth 2 (two) times daily for 4 days, THEN 1 tablet (6 mg total) daily for 2 days. 09/30/20 10/06/20  Patrecia Pour, MD  famotidine (PEPCID) 20  MG tablet Take 1 tablet (20 mg total) by mouth 2 (two) times daily. 09/25/20   Eulogio Bear, NP  fluticasone (FLONASE) 50 MCG/ACT nasal spray Place 2 sprays into both nostrils daily. 06/23/20   Eulogio Bear, NP  furosemide (LASIX) 40 MG tablet Take 40 mg by mouth daily. 01/14/20   [provider]  gabapentin (NEURONTIN) 300 MG capsule Take 1-2 capsules (300-600 mg total) by mouth at bedtime as needed. Patient taking differently: Take 600 mg by mouth at bedtime as needed.  06/23/20   Eulogio Bear, NP  isosorbide mononitrate (IMDUR) 30 MG 24 hr tablet Take 30 mg by mouth daily.   06/16/20 09/27/20  [provider]  losartan (COZAAR) 50 MG tablet Take 1 tablet (50 mg total) by mouth daily. 09/15/20   Eulogio Bear, NP  metFORMIN (GLUCOPHAGE) 1000 MG tablet Take 1 tablet (1,000 mg total) by mouth 2 (two) times daily with a meal. 06/23/20   Eulogio Bear, NP  metoprolol succinate (TOPROL-XL) 50 MG 24 hr tablet Take 50 mg by mouth daily. 06/08/20   [provider]  pantoprazole (PROTONIX) 40 MG tablet Take 1 tablet (40 mg total) by mouth daily. 09/25/20   Eulogio Bear, NP  pravastatin (PRAVACHOL) 40 MG tablet Take 0.5 tablets (20 mg total) by mouth every evening. 06/23/20   Eulogio Bear, NP  traZODone (DESYREL) 50 MG tablet Take 1 tablet (50 mg total) by mouth at bedtime. PRN 06/23/20   Eulogio Bear, NP    Allergies Patient has no known allergies.    Social History Social History   Tobacco Use  . Smoking status: Former Smoker    Types: Cigarettes    Quit date: 1990    Years since quitting: 31.8  . Smokeless tobacco: Never Used  Vaping Use  . Vaping Use: Never used  Substance Use Topics  . Alcohol use: Not Currently  . Drug use: Never    Review of Systems Patient denies headaches, rhinorrhea, blurry vision, numbness, shortness of breath, chest pain, edema, cough, abdominal pain, nausea, vomiting, diarrhea, dysuria, fevers, rashes or hallucinations unless otherwise stated above in HPI. ____________________________________________   PHYSICAL EXAM:  VITAL SIGNS: Vitals:   10/01/20 1039 10/01/20 1042  BP: (!) 95/54 (!) 104/59  Pulse: 64   Resp: (!) 22   Temp: 98 F (36.7 C)   SpO2: 95%     Constitutional: Alert and oriented.  Eyes: Conjunctivae are normal.  Head: Atraumatic. Nose: No congestion/rhinnorhea. Mouth/Throat: Mucous membranes are moist.   Neck: No stridor. Painless ROM.  Cardiovascular: Normal rate, regular rhythm. Grossly normal heart sounds.  Good peripheral circulation. Respiratory: Normal  respiratory effort.  No retractions. Lungs with scattered crackles throughout Gastrointestinal: Soft and nontender. No distention. No abdominal bruits. No CVA tenderness. Genitourinary:  Musculoskeletal: No lower extremity tenderness nor edema.  No joint effusions. Neurologic:  Normal speech and language. No gross focal neurologic deficits are appreciated. No facial droop Skin:  Skin is warm, dry and intact. No rash noted. Psychiatric: Mood and affect are normal. Speech and behavior are normal.  ____________________________________________   LABS (all labs ordered are listed, but only abnormal results are displayed)  Results for orders placed or performed during the hospital encounter of 10/01/20 (from the past 24 hour(s))  Basic metabolic panel     Status: Abnormal   Collection Time: 10/01/20 10:46 AM  Result Value Ref Range   Sodium 133 (L) 135 - 145 mmol/L   Potassium  4.3 3.5 - 5.1 mmol/L   Chloride 100 98 - 111 mmol/L   CO2 22 22 - 32 mmol/L   Glucose, Bld 250 (H) 70 - 99 mg/dL   BUN 40 (H) 8 - 23 mg/dL   Creatinine, Ser 1.57 (H) 0.61 - 1.24 mg/dL   Calcium 8.9 8.9 - 10.3 mg/dL   GFR, Estimated 46 (L) >60 mL/min   Anion gap 11 5 - 15  CBC     Status: Abnormal   Collection Time: 10/01/20 10:46 AM  Result Value Ref Range   WBC 14.7 (H) 4.0 - 10.5 K/uL   RBC 4.81 4.22 - 5.81 MIL/uL   Hemoglobin 12.2 (L) 13.0 - 17.0 g/dL   HCT 36.2 (L) 39 - 52 %   MCV 75.3 (L) 80.0 - 100.0 fL   MCH 25.4 (L) 26.0 - 34.0 pg   MCHC 33.7 30.0 - 36.0 g/dL   RDW 13.2 11.5 - 15.5 %   Platelets 310 150 - 400 K/uL   nRBC 0.0 0.0 - 0.2 %  Troponin I (High Sensitivity)     Status: None   Collection Time: 10/01/20 10:46 AM  Result Value Ref Range   Troponin I (High Sensitivity) 8 <18 ng/L  Troponin I (High Sensitivity)     Status: None   Collection Time: 10/01/20 12:58 PM  Result Value Ref Range   Troponin I (High Sensitivity) 6 <18 ng/L  Glucose, capillary     Status: Abnormal   Collection Time:  10/01/20  2:23 PM  Result Value Ref Range   Glucose-Capillary 204 (H) 70 - 99 mg/dL   ____________________________________________   ____________________________________________  RADIOLOGY  I personally reviewed all radiographic images ordered to evaluate for the above acute complaints and reviewed radiology reports and findings.  These findings were personally discussed with the patient.  Please see medical record for radiology report.  ____________________________________________   PROCEDURES  Procedure(s) performed:  Procedures    Critical Care performed: no ____________________________________________   INITIAL IMPRESSION / ASSESSMENT AND PLAN / ED COURSE  Pertinent labs & imaging results that were available during my care of the patient were reviewed by me and considered in my medical decision making (see chart for details).   DDX: Dehydration, AKI, diabetes, electrolyte abnormality, pneumonia, CHF  OM LIZOTTE is a 64 y.o. who presents to the ED with concern over malaise lightheadedness and elevated blood sugar.  Will give IV hydration.  Does not have any new hypoxia.  Suspect hyperglycemia secondary to mild dehydration as well as steroids from recent hospitalization.  Will give IV insulin IV fluids will check x-ray and observe here in the ER.  Since he is due for his last dose of remdesivir will order that here.  Clinical Course as of Oct 01 1424  Sun Oct 01, 2020  1418 Patient observed and does show evidence of hypoxia to 88% with a good waveform just at rest.  Placed on [address] Given his malaise and symptoms with evidence of recurrent hypoxia will discuss with hospitalist for further observation medical management.   [PR]    Clinical Course User Index [PR] Merlyn Lot, MD    The patient was evaluated in Emergency Department today for the symptoms described in the history of present illness. He/she was evaluated in the context of the global COVID-19  pandemic, which necessitated consideration that the patient might be at risk for infection with the SARS-CoV-2 virus that causes COVID-19. Institutional protocols and algorithms that pertain to the evaluation of  patients at risk for COVID-19 are in a state of rapid change based on information released by regulatory bodies including the CDC and federal and state organizations. These policies and algorithms were followed during the patient's care in the ED.  As part of my medical decision making, I reviewed the following data within the [place] notes reviewed and incorporated, Labs reviewed, notes from prior ED visits and [place] Controlled Substance Database   ____________________________________________   FINAL CLINICAL IMPRESSION(S) / ED DIAGNOSES  Final diagnoses:  Pneumonia due to COVID-19 virus      NEW MEDICATIONS STARTED DURING THIS VISIT:  New Prescriptions   No medications on file     Note:  This document was prepared using Dragon voice recognition software and may include unintentional dictation errors.    Merlyn Lot, MD 10/01/20 1425

## 2020-10-01 NOTE — H&P (Addendum)
History and Physical    Steven Vang Uber WER:[id number] DOB: October 24, 1956 DOA: 10/01/2020  PCP: Eulogio Bear, NP   Patient coming from: Home  I have personally briefly reviewed patient's old medical records in [place]  Chief Complaint: Shortness of breath                                Elevated blood sugars  HPI: Steven Vang is a 64 y.o. male with medical history significant for coronary artery disease, diabetes mellitus, hypertension, obesity, depression and anxiety who was recently admitted to the hospital for COVID-19 viral infection. Patient presents  to the emergency room for evaluation of shortness of breath, weakness and elevated blood sugars. Patient is unvaccinated and was recently hospitalized and received remdesivir. He had a positive PCR test done at [facility] on [facility]22[facility]. He presents today because his blood sugars have been elevated he said in the 300s secondary to the steroids that he was discharged home on. He also has poor oral intake due to loss of taste and smell. He has felt lightheaded and dizzy but denies having any falls. He has been compliant with his medications. He denies having any more nausea, vomiting or diarrhea. He denies having any chest pain, no urinary symptoms, no fever, no chills. Upon arrival to the ER he was noted to be hypotensive with systolic blood pressure of 74mmHg and had resting hypoxia with pulse oximetry of 88% that improved following oxygen supplementation at 2 L. Chest x-ray reviewed by me shows bibasilar airspace opacities consistent with multifocal pneumonia.    ED Course: Patient is a 64 year old unvaccinated male who presents to the emergency room for evaluation of worsening shortness of breath and elevated blood sugars. He was recently hospitalized for COVID-19 viral infection and was treated with remdesivir. He was discharged home on Decadron. In the ER he was noted to be hypoxic with pulse oximetry of 88% on room air at rest  and is improved with oxygen supplementation at 2 L. Chest x-ray shows findings suggestive of multifocal pneumonia. He will be admitted to the hospital for further evaluation.  Review of Systems: As per HPI otherwise 10 point review of systems negative.    Past Medical History:  Diagnosis Date  . Abnormal EKG   . Anxiety disorder   . CAD (coronary artery disease)   . CHF (congestive heart failure) ([place])   . Chronic systolic heart failure ([place])   . Depression   . Diabetes mellitus ([place])   . GERD (gastroesophageal reflux disease)   . Hypertension   . Lipoma of head 03/31/2020  . Mixed hyperlipidemia   . Non morbid obesity due to excess calories   . Type 2 diabetes mellitus without complication, without long-term current use of insulin [facility])     Past Surgical History:  Procedure Laterality Date  . ANKLE FRACTURE SURGERY Right   . CARDIAC CATHETERIZATION    . CHOLECYSTECTOMY    . CORONARY STENT PLACEMENT  2015   3 stents  . HAND SURGERY Right   . LEFT HEART CATH AND CORONARY ANGIOGRAPHY Left 06/02/2020   Procedure: LEFT HEART CATH AND CORONARY ANGIOGRAPHY;  Surgeon: Dionisio David, MD;  Location: [facility];  Service: Cardiovascular;  Laterality: Left;     reports that he quit smoking about 31 years ago. His smoking use included cigarettes. He has never used smokeless tobacco. He reports previous alcohol use. He  reports that he does not use drugs.  No Known Allergies  Family History  Problem Relation Age of Onset  . CAD Mother   . Diabetes Mother   . Colon cancer Father   . Breast cancer Paternal Grandmother   . Stomach cancer Paternal Grandfather      Prior to Admission medications   Medication Sig Start Date End Date Taking? Authorizing Provider  aspirin 81 MG EC tablet Take 1 tablet by mouth daily.   Yes [provider]  citalopram (CELEXA) 40 MG tablet Take 1 tablet (40 mg total) by mouth daily. 06/23/20  Yes Eulogio Bear, NP  clopidogrel  (PLAVIX) 75 MG tablet Take 1 tablet (75 mg total) by mouth daily. Patient taking differently: Take 75 mg by mouth at bedtime.  06/23/20  Yes Eulogio Bear, NP  dexamethasone (DECADRON) 6 MG tablet Take 1 tablet (6 mg total) by mouth 2 (two) times daily for 4 days, THEN 1 tablet (6 mg total) daily for 2 days. 09/30/20 10/06/20 Yes Patrecia Pour, MD  famotidine (PEPCID) 20 MG tablet Take 1 tablet (20 mg total) by mouth 2 (two) times daily. 09/25/20  Yes Eulogio Bear, NP  fluticasone (FLONASE) 50 MCG/ACT nasal spray Place 2 sprays into both nostrils daily. 06/23/20  Yes Noemi Chapel A, NP  furosemide (LASIX) 40 MG tablet Take 40 mg by mouth daily. 01/14/20  Yes [provider]  gabapentin (NEURONTIN) 300 MG capsule Take 1-2 capsules (300-600 mg total) by mouth at bedtime as needed. Patient taking differently: Take 600 mg by mouth at bedtime as needed.  06/23/20  Yes Noemi Chapel A, NP  isosorbide mononitrate (IMDUR) 30 MG 24 hr tablet Take 30 mg by mouth at bedtime.  06/16/20 10/01/20 Yes [provider]  losartan (COZAAR) 50 MG tablet Take 1 tablet (50 mg total) by mouth daily. Patient taking differently: Take 50 mg by mouth at bedtime.  09/15/20  Yes Eulogio Bear, NP  metFORMIN (GLUCOPHAGE) 1000 MG tablet Take 1 tablet (1,000 mg total) by mouth 2 (two) times daily with a meal. 06/23/20  Yes Noemi Chapel A, NP  metoprolol succinate (TOPROL-XL) 50 MG 24 hr tablet Take 50 mg by mouth at bedtime.  06/08/20  Yes [provider]  pantoprazole (PROTONIX) 40 MG tablet Take 1 tablet (40 mg total) by mouth daily. 09/25/20  Yes Eulogio Bear, NP  pravastatin (PRAVACHOL) 40 MG tablet Take 0.5 tablets (20 mg total) by mouth every evening. 06/23/20  Yes Eulogio Bear, NP  traZODone (DESYREL) 50 MG tablet Take 1 tablet (50 mg total) by mouth at bedtime. PRN Patient taking differently: Take 50 mg by mouth at bedtime.  06/23/20  Yes Eulogio Bear, NP     Physical Exam: Vitals:   10/01/20 1039 10/01/20 1042 10/01/20 1230 10/01/20 1245  BP: (!) 95/54 (!) 104/59 105/63 113/68  Pulse: 64  (!) 58 64  Resp: (!) 22     Temp: 98 F (36.7 C)     TempSrc: Oral     SpO2: 95%  91% 91%  Weight: 117.9 kg     Height: 5\' 10"  (1.778 m)        Vitals:   10/01/20 1039 10/01/20 1042 10/01/20 1230 10/01/20 1245  BP: (!) 95/54 (!) 104/59 105/63 113/68  Pulse: 64  (!) 58 64  Resp: (!) 22     Temp: 98 F (36.7 C)     TempSrc: Oral     SpO2:  95%  91% 91%  Weight: 117.9 kg     Height: 5\' 10"  (1.778 m)       Constitutional: NAD, alert and oriented x 3 Eyes: PERRL, lids and conjunctivae normal ENMT: Mucous membranes are moist.  Neck: normal, supple, no masses, no thyromegaly Respiratory: Bilateral air entry, no wheezing, no crackles. Normal respiratory effort. No accessory muscle use.  Cardiovascular: Regular rate and rhythm,no murmurs / rubs / gallops. No extremity edema. 2+ pedal pulses. No carotid bruits.  Abdomen: no tenderness, no masses palpated. No hepatosplenomegaly. Bowel sounds positive.  Musculoskeletal: no clubbing / cyanosis. No joint deformity upper and lower extremities.  Skin: no rashes, lesions, ulcers.  Neurologic: No gross focal neurologic deficit. Psychiatric: Normal mood and affect.   Labs on Admission: I have personally reviewed following labs and imaging studies  CBC: Recent Labs  Lab 09/27/20 1041 09/28/20 0441 09/29/20 0643 09/30/20 0415 10/01/20 1046  WBC 9.3 6.2 6.7 4.1 14.7*  NEUTROABS 7.7 4.7 5.0 3.4  --   HGB 13.2 12.2* 12.2* 12.6* 12.2*  HCT 40.2 36.4* 37.1* 38.1* 36.2*  MCV 78.5* 78.6* 79.1* 77.1* 75.3*  PLT 137* 127* 187 198 836   Basic Metabolic Panel: Recent Labs  Lab 09/27/20 1041 09/28/20 0441 09/29/20 0643 09/30/20 0415 10/01/20 1046  NA 133* 137 132* 132* 133*  K 4.3 4.2 4.4 4.8 4.3  CL 98 103 99 100 100  CO2 22 25 23  21* 22  GLUCOSE 133* 112* 107* 237* 250*  BUN 25* 21 24* 27*  40*  CREATININE 2.00* 1.39* 1.42* 1.33* 1.57*  CALCIUM 8.7* 8.4* 8.5* 8.8* 8.9  MG 2.0 2.1  --   --   --   PHOS  --  4.0  --   --   --    GFR: Estimated Creatinine Clearance: 62 mL/min (A) (by C-G formula based on SCr of 1.57 mg/dL (H)). Liver Function Tests: Recent Labs  Lab 09/27/20 1041 09/28/20 0441 09/29/20 0643 09/30/20 0415  AST 35 41 51* 43*  ALT 16 19 24 26   ALKPHOS 49 45 51 51  BILITOT 1.1 0.7 0.8 0.7  PROT 7.7 6.9 7.3 7.2  ALBUMIN 3.7 3.2* 3.3* 3.1*   No results for input(s): LIPASE, AMYLASE in the last 168 hours. No results for input(s): AMMONIA in the last 168 hours. Coagulation Profile: Recent Labs  Lab 09/27/20 1041  INR 1.1   Cardiac Enzymes: No results for input(s): CKTOTAL, CKMB, CKMBINDEX, TROPONINI in the last 168 hours. BNP (last 3 results) No results for input(s): PROBNP in the last 8760 hours. HbA1C: No results for input(s): HGBA1C in the last 72 hours. CBG: Recent Labs  Lab 09/29/20 1201 09/29/20 1728 09/29/20 2140 09/30/20 0810 10/01/20 1423  GLUCAP 76 181* 199* 219* 204*   Lipid Profile: No results for input(s): CHOL, HDL, LDLCALC, TRIG, CHOLHDL, LDLDIRECT in the last 72 hours. Thyroid Function Tests: No results for input(s): TSH, T4TOTAL, FREET4, T3FREE, THYROIDAB in the last 72 hours. Anemia Panel: No results for input(s): VITAMINB12, FOLATE, FERRITIN, TIBC, IRON, RETICCTPCT in the last 72 hours. Urine analysis: No results found for: COLORURINE, APPEARANCEUR, LABSPEC, [place], GLUCOSEU, HGBUR, BILIRUBINUR, KETONESUR, PROTEINUR, UROBILINOGEN, NITRITE, LEUKOCYTESUR  Radiological Exams on Admission: DG Chest Portable 1 View  Result Date: 10/01/2020 CLINICAL DATA:  Shortness of.  COVID-19 positive EXAM: PORTABLE CHEST 1 VIEW COMPARISON:  September 29, 2020. FINDINGS: There is patchy opacity in the lung bases, similar to recent study. Lungs elsewhere clear. Heart is upper normal in size with pulmonary  vascularity normal. No adenopathy.  No bone lesions. IMPRESSION: Bibasilar airspace opacity consistent with multifocal pneumonia. Suspect atypical organism etiology given the history and radiograph appearance. Heart size upper normal. No adenopathy. Electronically Signed   By: Lowella Grip III M.D.   On: 10/01/2020 12:26    EKG: Independently reviewed.   Assessment/Plan Principal Problem:   Pneumonia due to COVID-19 virus Active Problems:   Anxiety disorder   Chronic systolic heart failure (HCC)   Coronary artery disease   Type 2 diabetes mellitus without complication, without long-term current use of insulin (HCC)   Depression   Obesity (BMI 30-39.9)   Acute respiratory failure due to COVID-19 [facility])     Pneumonia due to COVID -19 virus with acute respiratory failure Patient tested positive for the COVID 19 virus on 09/22/20  He presents for evaluation of worsening shortness of breath and was noted to be hypoxic with room air pulse oximetry of 88%. Patient was placed on 2L of oxygen with improvement in his pulse oximetry to > 92% CXR shows multi focal pneumonia consistent with viral pneumonia Patient already completed a course of Remdesivir Continue Decadron 6mg  IV daily    Dehydration Patient presents to the ER for evaluation of weakness, fatigue, poor oral intake and shortness of breath. His oral intake has been poor Baseline serum creatinine is 1.3 and on admission it is 1.5 Continue IVF hydration Hold Furosemide and Cozaar for now    CAD Patient has a history of CAD and is s/p stent angioplasty Continue aspirin, Plavix and statins Hold nitrates and metoprolol for now due to relative hypotension.    Chronic systolic heart failure Secondary to ischemic cardiomyopathy Last known LVEF is about 50%. Hold furosemide, Cozaar and metoprolol due to relative hypotension    Diabetes mellitus with hyperglycemia Secondary to systemic steroids Will place patient on long acting insulin Continue  Novolog sliding scale coverage Optimize glycemic control   Depression Continue trazodone and Celexa   GERD Continue PPI     DVT prophylaxis: Lovenox Code Status: Full code Family Communication: Greater than 50% of time was spent discussing plan of care with patient at the bedside. All questions and concerns have been addressed. He verbalizes understanding and agrees with the plan. Disposition Plan: Back to previous home environment Consults called: None    Maurita Havener MD [facility]     10/01/2020, 4:46 PM

## 2020-10-01 NOTE — ED Notes (Signed)
cbg [id number]

## 2020-10-01 NOTE — Progress Notes (Signed)
PHARMACIST - PHYSICIAN COMMUNICATION  CONCERNING:  Enoxaparin (Lovenox) for DVT Prophylaxis    RECOMMENDATION: Patient was prescribed enoxaprin 40mg  q24 hours for VTE prophylaxis.   Filed Weights   10/01/20 1039  Weight: 117.9 kg (260 lb)    Body mass index is 37.31 kg/m.  Estimated Creatinine Clearance: 62 mL/min (A) (by C-G formula based on SCr of 1.57 mg/dL (H)).   Based on [place] patient is candidate for enoxaparin 0.5mg /kg TBW SQ every 24 hours based on BMI being >30.  DESCRIPTION: Pharmacy has adjusted enoxaparin dose per [facility] policy.  Patient is now receiving enoxaparin 60 mg every 24 hours    Giana Castner, PharmD Clinical Pharmacist  10/01/2020 3:11 PM

## 2020-10-01 NOTE — ED Notes (Signed)
Advised nurse that patient has assigned bed 

## 2020-10-01 NOTE — Progress Notes (Signed)
Remdesivir - Pharmacy Brief Note   O:  Pt discharged from [facility] yesterday. Pt received remdesivir 10/13-10/16; pt was supposed to receive his 5th and last dose at an infusion clinic today. Will complete previous regimen.    A/P:  Remdesivir 100 mg IVPB x1 today to complete 5-day course.     Rayna Sexton, PharmD, BCPS Clinical Pharmacist 10/01/2020 12:19 PM

## 2020-10-02 DIAGNOSIS — E669 Obesity, unspecified: Secondary | ICD-10-CM

## 2020-10-02 DIAGNOSIS — I2510 Atherosclerotic heart disease of native coronary artery without angina pectoris: Secondary | ICD-10-CM

## 2020-10-02 DIAGNOSIS — F330 Major depressive disorder, recurrent, mild: Secondary | ICD-10-CM

## 2020-10-02 DIAGNOSIS — E119 Type 2 diabetes mellitus without complications: Secondary | ICD-10-CM

## 2020-10-02 LAB — COMPREHENSIVE METABOLIC PANEL
ALT: 29 U/L (ref 0–44)
AST: 37 U/L (ref 15–41)
Albumin: 3.2 g/dL — ABNORMAL LOW (ref 3.5–5.0)
Alkaline Phosphatase: 51 U/L (ref 38–126)
Anion gap: 10 (ref 5–15)
BUN: 29 mg/dL — ABNORMAL HIGH (ref 8–23)
CO2: 22 mmol/L (ref 22–32)
Calcium: 8.9 mg/dL (ref 8.9–10.3)
Chloride: 106 mmol/L (ref 98–111)
Creatinine, Ser: 1.24 mg/dL (ref 0.61–1.24)
GFR, Estimated: >60 mL/min (ref >60)
Glucose, Bld: 184 mg/dL — ABNORMAL HIGH (ref 70–99)
Potassium: 4.1 mmol/L (ref 3.5–5.1)
Sodium: 138 mmol/L (ref 135–145)
Total Bilirubin: 0.7 mg/dL (ref 0.3–1.2)
Total Protein: 6.9 g/dL (ref 6.5–8.1)

## 2020-10-02 LAB — CBC WITH DIFFERENTIAL/PLATELET
Abs Immature Granulocytes: 0.11 10*3/uL — ABNORMAL HIGH (ref 0.00–0.07)
Basophils Absolute: 0 10*3/uL (ref 0.0–0.1)
Basophils Relative: 0 %
Eosinophils Absolute: 0 10*3/uL (ref 0.0–0.5)
Eosinophils Relative: 0 %
HCT: 37.9 % — ABNORMAL LOW (ref 39.0–52.0)
Hemoglobin: 12.5 g/dL — ABNORMAL LOW (ref 13.0–17.0)
Immature Granulocytes: 1 %
Lymphocytes Relative: 8 %
Lymphs Abs: 0.8 10*3/uL (ref 0.7–4.0)
MCH: 25.3 pg — ABNORMAL LOW (ref 26.0–34.0)
MCHC: 33 g/dL (ref 30.0–36.0)
MCV: 76.7 fL — ABNORMAL LOW (ref 80.0–100.0)
Monocytes Absolute: 0.3 10*3/uL (ref 0.1–1.0)
Monocytes Relative: 3 %
Neutro Abs: 8.6 10*3/uL — ABNORMAL HIGH (ref 1.7–7.7)
Neutrophils Relative %: 88 %
Platelets: 324 10*3/uL (ref 150–400)
RBC: 4.94 MIL/uL (ref 4.22–5.81)
RDW: 13.3 % (ref 11.5–15.5)
WBC: 9.8 10*3/uL (ref 4.0–10.5)
nRBC: 0 % (ref 0.0–0.2)

## 2020-10-02 LAB — PHOSPHORUS: Phosphorus: 3 mg/dL (ref 2.5–4.6)

## 2020-10-02 LAB — CULTURE, BLOOD (ROUTINE X 2)
Culture: NO GROWTH
Culture: NO GROWTH
Special Requests: ADEQUATE
Special Requests: ADEQUATE

## 2020-10-02 LAB — FERRITIN: Ferritin: 338 ng/mL — ABNORMAL HIGH (ref 24–336)

## 2020-10-02 LAB — BLOOD GAS, VENOUS
Acid-Base Excess: 1.4 mmol/L (ref 0.0–2.0)
Bicarbonate: 26.6 mmol/L (ref 20.0–28.0)
O2 Saturation: 13.1 %
Patient temperature: 37
pCO2, Ven: 43 mmHg — ABNORMAL LOW (ref 44.0–60.0)
pH, Ven: 7.4 (ref 7.250–7.430)
pO2, Ven: <31 mmHg — CL (ref 32.0–45.0)

## 2020-10-02 LAB — GLUCOSE, CAPILLARY
Glucose-Capillary: 131 mg/dL — ABNORMAL HIGH (ref 70–99)
Glucose-Capillary: 198 mg/dL — ABNORMAL HIGH (ref 70–99)
Glucose-Capillary: 92 mg/dL (ref 70–99)
Glucose-Capillary: 92 mg/dL (ref 70–99)
Glucose-Capillary: 149 mg/dL — ABNORMAL HIGH (ref 70–99)

## 2020-10-02 LAB — C-REACTIVE PROTEIN: CRP: 4.5 mg/dL — ABNORMAL HIGH (ref <1.0)

## 2020-10-02 LAB — MAGNESIUM: Magnesium: 2.2 mg/dL (ref 1.7–2.4)

## 2020-10-02 LAB — FIBRIN DERIVATIVES D-DIMER (ARMC ONLY): Fibrin derivatives D-dimer (ARMC): 1226.13 ng/mL (FEU) — ABNORMAL HIGH (ref 0.00–499.00)

## 2020-10-02 MED ORDER — METOPROLOL SUCCINATE ER 50 MG PO TB24
50.0000 mg | ORAL_TABLET | Freq: Every day | ORAL | Status: DC
  Administered 2020-10-02 – 2020-10-03 (×2): 50 mg via ORAL
  Filled 2020-10-02 (×2): qty 1

## 2020-10-02 MED ORDER — HYDROCOD POLST-CPM POLST ER 10-8 MG/5ML PO SUER
5.0000 mL | Freq: Two times a day (BID) | ORAL | Status: DC | PRN
  Administered 2020-10-02 – 2020-10-04 (×5): 5 mL via ORAL
  Filled 2020-10-02 (×5): qty 5

## 2020-10-02 MED ORDER — ISOSORBIDE MONONITRATE ER 30 MG PO TB24
30.0000 mg | ORAL_TABLET | Freq: Every day | ORAL | Status: DC
  Administered 2020-10-02: 21:00:00 30 mg via ORAL
  Filled 2020-10-02: qty 1

## 2020-10-02 MED ORDER — LOSARTAN POTASSIUM 50 MG PO TABS
50.0000 mg | ORAL_TABLET | Freq: Every day | ORAL | Status: DC
  Administered 2020-10-02 – 2020-10-03 (×2): 50 mg via ORAL
  Filled 2020-10-02 (×2): qty 1

## 2020-10-02 MED ORDER — METOPROLOL SUCCINATE ER 50 MG PO TB24: 50.0000 mg | ORAL_TABLET | Freq: Every day | ORAL | Status: DC

## 2020-10-02 MED ORDER — BENZONATATE 100 MG PO CAPS
100.0000 mg | ORAL_CAPSULE | Freq: Three times a day (TID) | ORAL | Status: DC
  Administered 2020-10-02 – 2020-10-05 (×10): 100 mg via ORAL
  Filled 2020-10-02 (×10): qty 1

## 2020-10-02 NOTE — Progress Notes (Signed)
[place] visited pt. this AM in response to OR for prayer; pt. readmitted yesterday after being discharged home briefly; pt. expressed his frustration with how long he has been sick with COVID and says there are times when he doesn't feel like he has made any progress.  [facility] provided supportive listening and prayed w/pt. for continued healing and strength as he recovers.  [place] remains available as needed and may attempt follow-up tomorrow.

## 2020-10-02 NOTE — Progress Notes (Signed)
[place] received OR requesting prayer for pt. this evening; when [employer] attempted visit, pt.'s rm. dark and RN said pt. asleep.  Paul will attempt follow-up in AM or make referral to other chaplains for follow-up.

## 2020-10-02 NOTE — Progress Notes (Addendum)
PROGRESS NOTE  Steven Vang  IZT:[id number] DOB: April 04, 1956 DOA: 10/01/2020 PCP: Eulogio Bear, NP   Brief Narrative: Steven Vang is a 64 y.o. male with a history of CAD, ICM, HFrEF, HTN, HLD, T2DM, obesity and covid-19 diagnosed 10/8 who was admitted for covid pneumonia, discharged on 10/16 to complete remdesivir 10/17 and steroids by mouth at home. He felt poorly, coughing a lot and felt very lightheaded at home. His blood sugars were elevated, so he returned to the ED 10/17.   Assessment & Plan: Principal Problem:   Pneumonia due to COVID-19 virus Active Problems:   Anxiety disorder   Chronic systolic heart failure (HCC)   Coronary artery disease   Type 2 diabetes mellitus without complication, without long-term current use of insulin (HCC)   Depression   Obesity (BMI 30-39.9)   Acute respiratory failure due to COVID-19 ([place])  T2DM and steroid-induced hyperglycemia: Last HbA1c 6.8%, so was discharged without insulin with decreased dose of steroids. CBGs reached 300's at home, was 250mg /dl on arrival.  - Continue basal-bolus insulin while admitted. Will recheck HbA1c, consult diabetes coordinator for recommendations.  - Limit steroids as below.  AKI: Noted to be hypotensive on admission which has resolved. Has hx CHF.  - Stop IVF. Hold diuretics, will restart once BP rises and po intake improves. Appears euvolemic.  - Avoid nephrotoxins and hypotension.   Covid-19 pneumonia: [facility]-2 [facility]. Isolation for 21 days recommended. CXR in ED 10/17 stable from prior. Said to be hypoxemic in ED but was weaned to room air quickly without specific/new interventions.  - Completed remdesivir x5 days (10/13 - 10/17) - Would argue to limit steroids since hypoxia has resolved and his main issue is hyperglycemia and continued feeling poorly.  - Continue robitussin, add tussionex and tessalon scheduled.   CAD, ICM, chronic HFrEF, HTN: Most recent LVEF  50%. No angina, troponin wnl.  - Will continue to hold lasix until po intake improves durably.  - Restart ARB, imdur, metoprolol with rising BP - Continue DAPT, statin  Obesity: Estimated body mass index is 37.31 kg/m as calculated from the following:   Height as of this encounter: 5\' 10"  (1.778 m).   Weight as of this encounter: 117.9 kg.    Hyponatremia: Resolved.   DVT prophylaxis: Lovenox 0.5mg /kg q12h Code Status: Full Family Communication: Declined a call to family. When asked if it is ok for me to call his wife, he confirmed that would be fine. Called, no answer. Will try back later. Disposition Plan:  Status is: Inpatient  Remains inpatient appropriate because:Inpatient level of care appropriate due to severity of illness  Dispo: The patient is from: Home              Anticipated d/c is to: Home              Anticipated d/c date is: 1 day                Patient currently is not medically stable to d/c.  Consultants:   None  Procedures:   None  Antimicrobials:  Remdesivir   Subjective: Returned yesterday for lightheadedness worse with coughing and exerting himself, glucose remained in 300's. He's also had severe cough which is his current major complaint. Doesn't feel much better than the past few days.  Objective: Vitals:   10/02/20 0019 10/02/20 0422 10/02/20 0804 10/02/20 1141  BP: 135/86 (!) 161/105 (!) 120/97 (!) 147/85  Pulse: 69 83 94  68  Resp: 17 18 17 17   Temp: (!) 97.5 F (36.4 C) 97.7 F (36.5 C) 97.9 F (36.6 C) 97.6 F (36.4 C)  TempSrc: Oral Oral Oral Oral  SpO2: 92% 95% 93% 95%  Weight:      Height:        Intake/Output Summary (Last 24 hours) at 10/02/2020 1302 Last data filed at 10/02/2020 0300 Gross per 24 hour  Intake 495.12 ml  Output --  Net 495.12 ml   Filed Weights   10/01/20 1039  Weight: 117.9 kg   Gen: 64 y.o. male in no distress Pulm: Nonlabored breathing room air, crackles bilaterally, frequent hacking cough. CV:  Regular rate and rhythm. No murmur, rub, or gallop. No JVD, trace dependent edema. GI: Abdomen soft, non-tender, non-distended, with normoactive bowel sounds.  Ext: Warm, no deformities Skin: No rashes, lesions or ulcers on visualized skin. Neuro: Alert and oriented. No focal neurological deficits. Psych: Judgement and insight appear fair. Mood euthymic & affect congruent. Behavior is appropriate.    Data Reviewed: I have personally reviewed following labs and imaging studies  CBC: Recent Labs  Lab 09/27/20 1041 09/27/20 1041 09/28/20 0441 09/29/20 0643 09/30/20 0415 10/01/20 1046 10/02/20 0843  WBC 9.3   < > 6.2 6.7 4.1 14.7* 9.8  NEUTROABS 7.7  --  4.7 5.0 3.4  --  8.6*  HGB 13.2   < > 12.2* 12.2* 12.6* 12.2* 12.5*  HCT 40.2   < > 36.4* 37.1* 38.1* 36.2* 37.9*  MCV 78.5*   < > 78.6* 79.1* 77.1* 75.3* 76.7*  PLT 137*   < > 127* 187 198 310 324   < > = values in this interval not displayed.   Basic Metabolic Panel: Recent Labs  Lab 09/27/20 1041 09/27/20 1041 09/28/20 0441 09/29/20 0643 09/30/20 0415 10/01/20 1046 10/02/20 0843  NA 133*   < > 137 132* 132* 133* 138  K 4.3   < > 4.2 4.4 4.8 4.3 4.1  CL 98   < > 103 99 100 100 106  CO2 22   < > 25 23 21* 22 22  GLUCOSE 133*   < > 112* 107* 237* 250* 184*  BUN 25*   < > 21 24* 27* 40* 29*  CREATININE 2.00*   < > 1.39* 1.42* 1.33* 1.57* 1.24  CALCIUM 8.7*   < > 8.4* 8.5* 8.8* 8.9 8.9  MG 2.0  --  2.1  --   --   --  2.2  PHOS  --   --  4.0  --   --   --  3.0   < > = values in this interval not displayed.   Liver Function Tests: Recent Labs  Lab 09/27/20 1041 09/28/20 0441 09/29/20 0643 09/30/20 0415 10/02/20 0843  AST 35 41 51* 43* 37  ALT 16 19 24 26 29   ALKPHOS 49 45 51 51 51  BILITOT 1.1 0.7 0.8 0.7 0.7  PROT 7.7 6.9 7.3 7.2 6.9  ALBUMIN 3.7 3.2* 3.3* 3.1* 3.2*   Coagulation Profile: Recent Labs  Lab 09/27/20 1041  INR 1.1   CBG: Recent Labs  Lab 10/01/20 1423 10/01/20 1650 10/01/20 2030  10/02/20 0805 10/02/20 1141  GLUCAP 204* 169* 169* 198* 92   Anemia Panel: Recent Labs    10/02/20 0843  FERRITIN 338*    Recent Results (from the past 240 hour(s))  Respiratory Panel by RT PCR (Flu A&B, Covid) - Nasopharyngeal Swab     Status: Abnormal  Collection Time: 09/27/20 10:05 AM   Specimen: Nasopharyngeal Swab  Result Value Ref Range Status   SARS Coronavirus 2 by RT PCR POSITIVE (A) NEGATIVE Final    Comment: RESULT CALLED TO, READ BACK BY AND VERIFIED WITH: [facility] REGISTER 09/27/20 1241 KLW (NOTE) SARS-CoV-2 target nucleic acids are DETECTED.  SARS-CoV-2 RNA is generally detectable in upper respiratory specimens  during the acute phase of infection. Positive results are indicative of the presence of the identified virus, but do not rule out bacterial infection or co-infection with other pathogens not detected by the test. Clinical correlation with patient history and other diagnostic information is necessary to determine patient infection status. The expected result is Negative.  Fact Sheet for Patients:  PinkCheek.be  Fact Sheet for Healthcare Providers: GravelBags.it  This test is not yet approved or cleared by the [place] FDA and  has been authorized for detection and/or diagnosis of SARS-CoV-2 by FDA under an Emergency Use Authorization (EUA).  This EUA will remain in effect (meaning this test can be use d) for the duration of  the COVID-19 declaration under Section 564(b)(1) of the Act, 21 U.S.C. section 360bbb-3(b)(1), unless the authorization is terminated or revoked sooner.      Influenza A by PCR NEGATIVE NEGATIVE Final   Influenza B by PCR NEGATIVE NEGATIVE Final    Comment: (NOTE) The Xpert Xpress SARS-CoV-2/FLU/RSV assay is intended as an aid in  the diagnosis of influenza from Nasopharyngeal swab specimens and  should not be used as a sole basis for treatment. Nasal washings and    aspirates are unacceptable for Xpert Xpress SARS-CoV-2/FLU/RSV  testing.  Fact Sheet for Patients: PinkCheek.be  Fact Sheet for Healthcare Providers: GravelBags.it  This test is not yet approved or cleared by the [place] FDA and  has been authorized for detection and/or diagnosis of SARS-CoV-2 by  FDA under an Emergency Use Authorization (EUA). This EUA will remain  in effect (meaning this test can be used) for the duration of the  Covid-19 declaration under Section 564(b)(1) of the Act, 21  U.S.C. section 360bbb-3(b)(1), unless the authorization is  terminated or revoked. Performed at [facility], [address] [place], [place] [address]   Blood culture (routine x 2)     Status: None   Collection Time: 09/27/20 10:47 AM   Specimen: BLOOD  Result Value Ref Range Status   Specimen Description BLOOD LEFT ANTECUBITAL  Final   Special Requests   Final    BOTTLES DRAWN AEROBIC AND ANAEROBIC Blood Culture adequate volume   Culture   Final    NO GROWTH 5 DAYS Performed at [facility], [address], [place], [place] [address]    Report Status 10/02/2020 FINAL  Final  Blood culture (routine x 2)     Status: None   Collection Time: 09/27/20 10:47 AM   Specimen: BLOOD  Result Value Ref Range Status   Specimen Description BLOOD BLOOD RIGHT HAND  Final   Special Requests   Final    BOTTLES DRAWN AEROBIC AND ANAEROBIC Blood Culture adequate volume   Culture   Final    NO GROWTH 5 DAYS Performed at [facility], [address], [place], [place] [address]    Report Status 10/02/2020 FINAL  Final      Radiology Studies: DG Chest Portable 1 View  Result Date: 10/01/2020 CLINICAL DATA:  Shortness of.  COVID-19 positive EXAM: PORTABLE CHEST 1 VIEW COMPARISON:  September 29, 2020. FINDINGS: There is patchy  opacity in the lung bases, similar to recent study. Lungs elsewhere clear. Heart is  upper normal in size with pulmonary vascularity normal. No adenopathy. No bone lesions. IMPRESSION: Bibasilar airspace opacity consistent with multifocal pneumonia. Suspect atypical organism etiology given the history and radiograph appearance. Heart size upper normal. No adenopathy. Electronically Signed   By: Lowella Grip III M.D.   On: 10/01/2020 12:26    Scheduled Meds: . vitamin C  500 mg Oral Daily  . aspirin EC  81 mg Oral Daily  . benzonatate  100 mg Oral TID  . citalopram  40 mg Oral Daily  . clopidogrel  75 mg Oral QHS  . dexamethasone  6 mg Oral Q24H  . enoxaparin (LOVENOX) injection  0.5 mg/kg Subcutaneous Q24H  . famotidine  20 mg Oral BID  . fluticasone  2 spray Each Nare Daily  . insulin aspart  0-20 Units Subcutaneous TID WC  . insulin detemir  0.15 Units/kg Subcutaneous BID  . losartan  50 mg Oral Daily  . metoprolol succinate  50 mg Oral Daily  . pravastatin  20 mg Oral QPM  . traZODone  50 mg Oral QHS  . zinc sulfate  220 mg Oral Daily   Continuous Infusions:    LOS: 1 day   Time spent: 35 minutes.  Patrecia Pour, MD [facility] [email or web address] 10/02/2020, 1:02 PM

## 2020-10-02 NOTE — Plan of Care (Signed)
Pt was readmitted after 1 day at home. Came in w/soft BP b/c he was dehydrated and has been given fluids. Last BP was 161/105, HR 83. He's on losartan and metoprolol at bedtime that wasn't ordered here. He also has a hx of CHF.Sent text message to Dr. Damita Dunnings asking if we need to stop fluids and restart his BP meds.

## 2020-10-03 LAB — GLUCOSE, CAPILLARY
Glucose-Capillary: 174 mg/dL — ABNORMAL HIGH (ref 70–99)
Glucose-Capillary: 145 mg/dL — ABNORMAL HIGH (ref 70–99)
Glucose-Capillary: 142 mg/dL — ABNORMAL HIGH (ref 70–99)
Glucose-Capillary: 155 mg/dL — ABNORMAL HIGH (ref 70–99)

## 2020-10-03 LAB — C-REACTIVE PROTEIN: CRP: 6.3 mg/dL — ABNORMAL HIGH (ref <1.0)

## 2020-10-03 MED ORDER — METOPROLOL SUCCINATE ER 25 MG PO TB24: 25.0000 mg | ORAL_TABLET | Freq: Every day | ORAL | Status: DC

## 2020-10-03 MED ORDER — ALBUMIN HUMAN 5 % IV SOLN
12.5000 g | Freq: Once | INTRAVENOUS | Status: AC
  Administered 2020-10-03: 12.5 g via INTRAVENOUS
  Filled 2020-10-03: qty 250

## 2020-10-03 NOTE — Progress Notes (Signed)
Called patients wife and gave an update on patients condition.

## 2020-10-03 NOTE — Progress Notes (Signed)
PROGRESS NOTE  Steven Vang  WUJ:[id number] DOB: December 23, 1955 DOA: 10/01/2020 PCP: Eulogio Bear, NP   Brief Narrative: Steven Vang is a 64 y.o. male with a history of CAD, ICM, HFrEF, HTN, HLD, T2DM, obesity and covid-19 diagnosed 10/8 who was admitted for covid pneumonia, discharged on 10/16 to complete remdesivir 10/17 and steroids by mouth at home. He felt poorly, coughing a lot and felt very lightheaded at home. His blood sugars were elevated, so he returned to the ED 10/17. Insulin was restarted, Hb A1c is pending, and insulin teaching is underway. The patient remains on steroids with inflammatory marker elevation, though this is improving.   Assessment & Plan: Principal Problem:   Pneumonia due to COVID-19 virus Active Problems:   Anxiety disorder   Chronic systolic heart failure (HCC)   Coronary artery disease   Type 2 diabetes mellitus without complication, without long-term current use of insulin (HCC)   Depression   Obesity (BMI 30-39.9)   Acute respiratory failure due to COVID-19 ([place])  T2DM and steroid-induced hyperglycemia: Last HbA1c 6.8%, so was discharged without insulin with decreased dose of steroids. CBGs reached 300's at home, was 250mg /dl on arrival.  - Continue basal-bolus insulin while admitted.  - Anticipate short term insulin at the least will be required based on current requirements (levemir 18u BID + SSI. No hypoglycemia noted. D/w RN staff at progression rounds that insulin teaching is required prior to discharge.  - Await HbA1c (pending) - Limit steroids as below.  AKI: Noted to be hypotensive on admission which has resolved. Has hx CHF.  - BP back up, so will give home medications.  - Avoid nephrotoxins and hypotension.   Covid-19 pneumonia: [facility]. Isolation for 21 days recommended. CXR in ED 10/17 stable from prior. Said to be hypoxemic in ED but was weaned to room air quickly without specific/new  interventions.  - Completed remdesivir x5 days (10/13 - 10/17) - Plan to stop steroids as soon as CRP improved as long as hypoxia remains resolved. - Continue robitussin, add tussionex and tessalon scheduled. Cough remains severe.  CAD, ICM, chronic HFrEF, HTN: Most recent LVEF 50%. No angina, troponin wnl.  - Restarted ARB, imdur, metoprolol with rising BP. Sinus bradycardia without sinus pauses, also no AV block noted on my personal review of the telemetry strips. Will decrease dose of metoprolol for now 50mg  > 25mg .  - Continue DAPT, statin  Obesity: Estimated body mass index is 37.31 kg/m as calculated from the following:   Height as of this encounter: 5\' 10"  (1.778 m).   Weight as of this encounter: 117.9 kg.    Hyponatremia: Resolved.   DVT prophylaxis: Lovenox 0.5mg /kg q12h Code Status: Full Family Communication: Wife called yesterday without answer. She is night RN and will be working tonight, so will attempt to call later in the evening.  Disposition Plan:  Status is: Inpatient  Remains inpatient appropriate because:Inpatient level of care appropriate due to severity of illness  Dispo: The patient is from: Home              Anticipated d/c is to: Home - Pt remains very weak. Will get PT evaluation, begin insulin teaching in anticipation of new start insulin at discharge.              Anticipated d/c date is: 1 day                Patient currently is not medically stable  to d/c.  Consultants:   None  Procedures:   None  Antimicrobials:  Remdesivir   Subjective: Feels very weak and tired, still having persistent severe cough associated with headache. No chest pain or other new complaints. No fever. Eating well. Has not received insulin teaching. Does not feel he's made a whole lot of progress in feeling better since diagnosis.   Objective: Vitals:   10/02/20 2109 10/03/20 0044 10/03/20 0836 10/03/20 1153  BP: (!) 152/85 114/69 (!) 112/58 (!) 141/84  Pulse: 70 65  63 (!) 58  Resp: 20 18 20 19   Temp: 98.6 F (37 C) 97.7 F (36.5 C) 98.3 F (36.8 C) 98.8 F (37.1 C)  TempSrc: Oral Oral Oral   SpO2: 93% 94% 93% 97%  Weight:      Height:       No intake or output data in the 24 hours ending 10/03/20 1229 Filed Weights   10/01/20 1039  Weight: 117.9 kg   Gen: 64 y.o. male in no distress Pulm: Nonlabored with frequent hacking cough without sputum production. Some crackles noted, minimal. No wheezing or stridor. CV: Regular rate and rhythm. No murmur, rub, or gallop. No JVD, no dependent edema. GI: Abdomen soft, non-tender, non-distended, with normoactive bowel sounds.  Ext: Warm, no deformities Skin: No rashes, lesions or ulcers on visualized skin. Neuro: Alert and oriented. No focal neurological deficits. Psych: Judgement and insight appear fair. Mood euthymic & affect congruent. Behavior is appropriate.    Data Reviewed: I have personally reviewed following labs and imaging studies  CBC: Recent Labs  Lab 09/27/20 1041 09/27/20 1041 09/28/20 0441 09/29/20 0643 09/30/20 0415 10/01/20 1046 10/02/20 0843  WBC 9.3   < > 6.2 6.7 4.1 14.7* 9.8  NEUTROABS 7.7  --  4.7 5.0 3.4  --  8.6*  HGB 13.2   < > 12.2* 12.2* 12.6* 12.2* 12.5*  HCT 40.2   < > 36.4* 37.1* 38.1* 36.2* 37.9*  MCV 78.5*   < > 78.6* 79.1* 77.1* 75.3* 76.7*  PLT 137*   < > 127* 187 198 310 324   < > = values in this interval not displayed.   Basic Metabolic Panel: Recent Labs  Lab 09/27/20 1041 09/27/20 1041 09/28/20 0441 09/29/20 0643 09/30/20 0415 10/01/20 1046 10/02/20 0843  NA 133*   < > 137 132* 132* 133* 138  K 4.3   < > 4.2 4.4 4.8 4.3 4.1  CL 98   < > 103 99 100 100 106  CO2 22   < > 25 23 21* 22 22  GLUCOSE 133*   < > 112* 107* 237* 250* 184*  BUN 25*   < > 21 24* 27* 40* 29*  CREATININE 2.00*   < > 1.39* 1.42* 1.33* 1.57* 1.24  CALCIUM 8.7*   < > 8.4* 8.5* 8.8* 8.9 8.9  MG 2.0  --  2.1  --   --   --  2.2  PHOS  --   --  4.0  --   --   --  3.0   < >  = values in this interval not displayed.   Liver Function Tests: Recent Labs  Lab 09/27/20 1041 09/28/20 0441 09/29/20 0643 09/30/20 0415 10/02/20 0843  AST 35 41 51* 43* 37  ALT 16 19 24 26 29   ALKPHOS 49 45 51 51 51  BILITOT 1.1 0.7 0.8 0.7 0.7  PROT 7.7 6.9 7.3 7.2 6.9  ALBUMIN 3.7 3.2* 3.3* 3.1* 3.2*  Coagulation Profile: Recent Labs  Lab 09/27/20 1041  INR 1.1   CBG: Recent Labs  Lab 10/02/20 1141 10/02/20 1641 10/02/20 2110 10/03/20 0809 10/03/20 1201  GLUCAP 92 92 149* 174* 145*   Anemia Panel: Recent Labs    10/02/20 0843  FERRITIN 338*    Recent Results (from the past 240 hour(s))  Respiratory Panel by RT PCR (Flu A&B, Covid) - Nasopharyngeal Swab     Status: Abnormal   Collection Time: 09/27/20 10:05 AM   Specimen: Nasopharyngeal Swab  Result Value Ref Range Status   SARS Coronavirus 2 by RT PCR POSITIVE (A) NEGATIVE Final    Comment: RESULT CALLED TO, READ BACK BY AND VERIFIED WITH: [facility] REGISTER 09/27/20 1241 KLW (NOTE) SARS-CoV-2 target nucleic acids are DETECTED.  SARS-CoV-2 RNA is generally detectable in upper respiratory specimens  during the acute phase of infection. Positive results are indicative of the presence of the identified virus, but do not rule out bacterial infection or co-infection with other pathogens not detected by the test. Clinical correlation with patient history and other diagnostic information is necessary to determine patient infection status. The expected result is Negative.  Fact Sheet for Patients:  PinkCheek.be  Fact Sheet for Healthcare Providers: GravelBags.it  This test is not yet approved or cleared by the [place] FDA and  has been authorized for detection and/or diagnosis of SARS-CoV-2 by FDA under an Emergency Use Authorization (EUA).  This EUA will remain in effect (meaning this test can be use d) for the duration of  the COVID-19  declaration under Section 564(b)(1) of the Act, 21 U.S.C. section 360bbb-3(b)(1), unless the authorization is terminated or revoked sooner.      Influenza A by PCR NEGATIVE NEGATIVE Final   Influenza B by PCR NEGATIVE NEGATIVE Final    Comment: (NOTE) The Xpert Xpress SARS-CoV-2/FLU/RSV assay is intended as an aid in  the diagnosis of influenza from Nasopharyngeal swab specimens and  should not be used as a sole basis for treatment. Nasal washings and  aspirates are unacceptable for Xpert Xpress SARS-CoV-2/FLU/RSV  testing.  Fact Sheet for Patients: PinkCheek.be  Fact Sheet for Healthcare Providers: GravelBags.it  This test is not yet approved or cleared by the [place] FDA and  has been authorized for detection and/or diagnosis of SARS-CoV-2 by  FDA under an Emergency Use Authorization (EUA). This EUA will remain  in effect (meaning this test can be used) for the duration of the  Covid-19 declaration under Section 564(b)(1) of the Act, 21  U.S.C. section 360bbb-3(b)(1), unless the authorization is  terminated or revoked. Performed at [facility], [address] [place], [place] [address]   Blood culture (routine x 2)     Status: None   Collection Time: 09/27/20 10:47 AM   Specimen: BLOOD  Result Value Ref Range Status   Specimen Description BLOOD LEFT ANTECUBITAL  Final   Special Requests   Final    BOTTLES DRAWN AEROBIC AND ANAEROBIC Blood Culture adequate volume   Culture   Final    NO GROWTH 5 DAYS Performed at [facility], [address] [place], [place] [address]    Report Status 10/02/2020 FINAL  Final  Blood culture (routine x 2)     Status: None   Collection Time: 09/27/20 10:47 AM   Specimen: BLOOD  Result Value Ref Range Status   Specimen Description BLOOD BLOOD RIGHT HAND  Final   Special Requests   Final    BOTTLES DRAWN AEROBIC  AND ANAEROBIC Blood Culture adequate  volume   Culture   Final    NO GROWTH 5 DAYS Performed at [facility], [address] [place], [place] [address]    Report Status 10/02/2020 FINAL  Final      Radiology Studies: No results found.  Scheduled Meds: . vitamin C  500 mg Oral Daily  . aspirin EC  81 mg Oral Daily  . benzonatate  100 mg Oral TID  . citalopram  40 mg Oral Daily  . clopidogrel  75 mg Oral QHS  . dexamethasone  6 mg Oral Q24H  . enoxaparin (LOVENOX) injection  0.5 mg/kg Subcutaneous Q24H  . famotidine  20 mg Oral BID  . fluticasone  2 spray Each Nare Daily  . insulin aspart  0-20 Units Subcutaneous TID WC  . insulin detemir  0.15 Units/kg Subcutaneous BID  . isosorbide mononitrate  30 mg Oral QHS  . losartan  50 mg Oral Daily  . metoprolol succinate  50 mg Oral Daily  . pravastatin  20 mg Oral QPM  . traZODone  50 mg Oral QHS  . zinc sulfate  220 mg Oral Daily   Continuous Infusions:    LOS: 2 days   Time spent: 35 minutes.  Patrecia Pour, MD [facility] [email or web address] 10/03/2020, 12:29 PM

## 2020-10-03 NOTE — Progress Notes (Signed)
Inpatient Diabetes Program Recommendations  AACE/ADA: New Consensus Statement on Inpatient Glycemic Control (2015)  Target Ranges:  Prepandial:   less than 140 mg/dL      Peak postprandial:   less than 180 mg/dL (1-2 hours)      Critically ill patients:  140 - 180 mg/dL   Lab Results  Component Value Date   GLUCAP 145 (H) 10/03/2020   HGBA1C 6.9 03/31/2020    Review of Glycemic Control  Inpatient Diabetes Program Recommendations:   Unable to reach patient by phone, but spoke with wife who works as an Therapist, sports on the unit. Wife is comfortable assisting and helping patient with insulin @ discharge. Patient has a glucose sensor and glucose meter @ home.  Will continue to follow during hospitalization.  Thank you, Nani Gasser. Ethelle Ola, RN, MSN, CDE  Diabetes Coordinator Inpatient Glycemic Control Team Team Pager [phone] (8am-5pm) 10/03/2020 3:53 PM

## 2020-10-04 LAB — GLUCOSE, CAPILLARY
Glucose-Capillary: 268 mg/dL — ABNORMAL HIGH (ref 70–99)
Glucose-Capillary: 244 mg/dL — ABNORMAL HIGH (ref 70–99)
Glucose-Capillary: 228 mg/dL — ABNORMAL HIGH (ref 70–99)
Glucose-Capillary: 148 mg/dL — ABNORMAL HIGH (ref 70–99)
Glucose-Capillary: 226 mg/dL — ABNORMAL HIGH (ref 70–99)

## 2020-10-04 LAB — HEMOGLOBIN A1C
Hgb A1c MFr Bld: 7.7 % — ABNORMAL HIGH (ref 4.8–5.6)
Mean Plasma Glucose: 174 mg/dL

## 2020-10-04 LAB — C-REACTIVE PROTEIN: CRP: 5.9 mg/dL — ABNORMAL HIGH (ref <1.0)

## 2020-10-04 NOTE — Hospital Course (Addendum)
Steven Vang is a 64 y.o. male with a history of CAD, ICM, HFrEF, HTN, HLD, T2DM, obesity and covid-19 diagnosed 10/8 who was admitted for covid pneumonia, discharged on 10/16 to complete remdesivir 10/17 and steroids by mouth at home. He felt poorly, coughing a lot and felt very lightheaded at home. His blood sugars were elevated, so he returned to the ED 10/17. Insulin was restarted.

## 2020-10-04 NOTE — Progress Notes (Signed)
   10/03/20 2013  Assess: MEWS Score  Temp 98.1 F (36.7 C)  BP (!) 77/56  Pulse Rate 73  Resp 18  Level of Consciousness Alert  SpO2 93 %  O2 Device Nasal Cannula  Assess: MEWS Score  MEWS Temp 0  MEWS Systolic 2  MEWS Pulse 0  MEWS RR 0  MEWS LOC 0  MEWS Score 2  MEWS Score Color Yellow  Assess: if the MEWS score is Yellow or Red  Were vital signs taken at a resting state? Yes  Focused Assessment No change from prior assessment  Early Detection of Sepsis Score *See Row Information* Low  MEWS guidelines implemented *See Row Information* Yes  Treat  MEWS Interventions Other (Comment) (MD Paged. Meds ordered)  Pain Scale 0-10  Pain Score 0  Take Vital Signs  Increase Vital Sign Frequency  Yellow: Q 2hr X 2 then Q 4hr X 2, if remains yellow, continue Q 4hrs  Escalate  MEWS: Escalate Yellow: discuss with charge nurse/RN and consider discussing with provider and RRT (per documentation)  Notify: Charge Nurse/RN  Name of Charge Nurse/RN Notified Phylis, RN  Date Charge Nurse/RN Notified 10/03/20  Time Charge Nurse/RN Notified 2000  Document  Patient Outcome Stabilized after interventions (per Evalina Field RN)  copy and pasted per Evalina Field RN

## 2020-10-04 NOTE — Evaluation (Signed)
Physical Therapy Evaluation Patient Details Name: Steven Vang MRN: [id number] DOB: Apr 20, 1956 Today's Date: 10/04/2020   History of Present Illness  Per MD: Steven Vang is a 64 y.o. male with a history of CAD, ICM, HFrEF, HTN, HLD, T2DM, obesity and covid-19 diagnosed 10/8 who was admitted for covid pneumonia, discharged on 10/16 to complete remdesivir 10/17 and steroids by mouth at home. He felt poorly, coughing a lot and felt very lightheaded at home. His blood sugars were elevated, so he returned to the ED 10/17. Insulin was restarted, Hb A1c is pending, and insulin teaching is underway. The patient remains on steroids with inflammatory marker elevation, though this is improving.     Clinical Impression  Pt received in supine position upon arrival to room.  Pt educated on PT role and services provided during hospital stay.  Pt reports he is at baseline levels and after performing LE MMT and further evaluation, pt seems to be doing well.  Upon standing and ambulation, pt appears steady, no safety concerns.  Pt currently denies need for acute PT services, with therapist in agreement.  Will complete initial order at this time.  Please reconsult should needs change    Follow Up Recommendations No PT follow up    Equipment Recommendations  None recommended by PT    Recommendations for Other Services       Precautions / Restrictions Precautions Precautions: None Restrictions Weight Bearing Restrictions: No      Mobility  Bed Mobility Overal bed mobility: Independent                  Transfers Overall transfer level: Independent                  Ambulation/Gait Ambulation/Gait assistance: Independent Gait Distance (Feet): 10 Feet Assistive device: None Gait Pattern/deviations: WFL(Within Functional Limits) Gait velocity: WNL   General Gait Details: Pt able to ambulate independently within hospital room and back to bed.  Stairs             Wheelchair Mobility    Modified Rankin (Stroke Patients Only)       Balance Overall balance assessment: Independent                                           Pertinent Vitals/Pain Pain Assessment: No/denies pain    Home Living Family/patient expects to be discharged to:: Private residence Living Arrangements: Spouse/significant other Available Help at Discharge: Family           Home Equipment: None      Prior Function Level of Independence: Independent               Hand Dominance        Extremity/Trunk Assessment   Upper Extremity Assessment Upper Extremity Assessment: Overall WFL for tasks assessed    Lower Extremity Assessment Lower Extremity Assessment: Overall WFL for tasks assessed    Cervical / Trunk Assessment Cervical / Trunk Assessment: Normal  Communication   Communication: No difficulties  Cognition Arousal/Alertness: Awake/alert Behavior During Therapy: WFL for tasks assessed/performed Overall Cognitive Status: Within Functional Limits for tasks assessed                                        General Comments  Exercises Total Joint Exercises Ankle Circles/Pumps: AROM;Both;10 reps;Supine Marching in Standing: AROM;Strengthening;Both;10 reps;Standing   Assessment/Plan    PT Assessment Patent does not need any further PT services  PT Problem List         PT Treatment Interventions      PT Goals (Current goals can be found in the Care Plan section)  Acute Rehab PT Goals Patient Stated Goal: To go home. PT Goal Formulation: With patient Time For Goal Achievement: 10/18/20 Potential to Achieve Goals: Good    Frequency     Barriers to discharge        Co-evaluation               AM-PAC PT "6 Clicks" Mobility  Outcome Measure Help needed turning from your back to your side while in a flat bed without using bedrails?: None Help needed moving from lying on your back to  sitting on the side of a flat bed without using bedrails?: None Help needed moving to and from a bed to a chair (including a wheelchair)?: None Help needed standing up from a chair using your arms (e.g., wheelchair or bedside chair)?: None Help needed to walk in hospital room?: None Help needed climbing 3-5 steps with a railing? : None 6 Click Score: 24    End of Session   Activity Tolerance: Patient tolerated treatment well Patient left: in bed;with call bell/phone within reach Nurse Communication: Mobility status      Time: 8177-1165 PT Time Calculation (min) (ACUTE ONLY): 10 min   Charges:   PT Evaluation $PT Eval Low Complexity: 1 Low          Gwenlyn Saran, PT, DPT 10/04/20, 12:27 PM

## 2020-10-04 NOTE — Progress Notes (Signed)
PROGRESS NOTE    Steven Vang   MGQ:[id number]  DOB: Apr 26, 1956  PCP: Eulogio Bear, NP    DOA: 10/01/2020 LOS: 3   Brief Narrative   Steven Vang is a 64 y.o. male with a history of CAD, ICM, HFrEF, HTN, HLD, T2DM, obesity and covid-19 diagnosed 10/8 who was admitted for covid pneumonia, discharged on 10/16 to complete remdesivir 10/17 and steroids by mouth at home. He felt poorly, coughing a lot and felt very lightheaded at home. His blood sugars were elevated, so he returned to the ED 10/17. Insulin was restarted, Hb A1c is pending, and insulin teaching is underway. The patient remains on steroids with inflammatory marker elevation, though this is improving.     Assessment & Plan   Principal Problem:   Pneumonia due to COVID-19 virus Active Problems:   Anxiety disorder   Chronic systolic heart failure (HCC)   Coronary artery disease   Type 2 diabetes mellitus without complication, without long-term current use of insulin (HCC)   Depression   Obesity (BMI 30-39.9)   Acute respiratory failure due to COVID-19 [facility])   Type 2 diabetes /steroid-induced hyperglycemia -most recent A1c prior to admission was 6.8%.  Repeat A1c now 7.7%. Previously on only Metformin at home per chart review.  Patient was discharged home from admission for COVID-19 on steroids but not sent with insulin.  Blood glucose at home reached three hundreds, was 250 on presentation. --Currently on Levemir 18 units twice daily, resistant sliding scale NovoLog --CBGs are at inpatient goal --Diabetes coordinator following --Expect to require insulin on discharge until steroid-induced hyperglycemia has resolved --Insulin education --Taper off steroids as possible  Hypotension -10/19-20, patient's blood pressure dropped to 77/56.  Required IV albumin.  Will monitor BP another 24 hours and if stable likely discharge home tomorrow.  Monitor closely.  Maintain MAP > 65.  Acute respiratory failure with  hypoxia due to COVID-19 pneumonia -initially tested positive at [facility] on 09/22/2020.  Chest x-ray in the ED on 10/17 was stable from previous.   Completed remdesivir 10/13 -10/17. Continue antitussives.  Patient continues to have severe cough. --Taper down steroids now that hypoxia has resolved. --On dexamethasone 6 mg p.o. daily (scheduled to end 10/22) --Weight-based Lovenox for DVT prophylaxis --Pepcid twice daily while on steroids --Vitamin C and zinc --Antitussives (Tussionex, Robitussin-DM, Tessalon Perles)  Acute kidney injury -resolved.  Present on admission in the setting of hypotension, most likely prerenal azotemia. --Monitor BMP --Avoid nephrotoxins and hypotension, renally dose meds were indicated  Hyponatremia -resolved  Coronary artery disease -stable, no active chest pain or troponin elevation.   Left heart cath in July 2021 showed patent LAD stent, high-grade 1 diagonal, medical management was recommended. --Continue DAPT and statin  Ischemic cardiomyopathy / Chronic HFrEF -currently appears euvolemic.  On chart review most recent echo was [facility] in July 2019, showed EF 26%, grade 2 diastolic dysfunction., no EF listed on [facility] report.   Hypertension -  stable --Resumed on ARB, Imdur, metoprolol  Sinus bradycardia -on telemetry, without AV block seen thus far.  Metoprolol was previously decreased from 50 mg to 25 mg.  Obesity: Body mass index is 37.31 kg/m.  Complicates overall care and prognosis.  Recommend lifestyle modifications including diet and exercise for weight loss.  Body habitus is complicating patient's respiratory status with COVID-19 pneumonia.  DVT prophylaxis:    Diet:  Diet Orders (From admission, onward)    Start     Ordered   10/01/20 1455  Diet heart healthy/carb modified Room service appropriate? Yes; Fluid consistency: Thin  Diet effective now       Question Answer Comment  Diet-HS Snack? Nothing   Room service appropriate? Yes   Fluid  consistency: Thin      10/01/20 1457            Code Status: Full Code    Subjective 10/04/20    Patient seen this morning at bedside.  Reports his cough still is terrible, very slowly getting better.  He is aware he had low blood pressure last night but denies any symptoms at the time including dizziness or lightheadedness, chest pain denies other acute complaints today.   Disposition Plan & Communication   Status is: Inpatient  Remains inpatient appropriate because:Inpatient level of care appropriate due to severity of illness.  Patient had episode of significant hypotension overnight, requiring an additional 24 hours close monitoring prior to safe discharge home.   Dispo: The patient is from: Home              Anticipated d/c is to: Home              Anticipated d/c date is: 1 day              Patient currently is not medically stable to d/c.   Family Communication: patient's wife is night RN here, will attempt to call late afternoon / early evening so as not to wake her sleeping.    Consults, Procedures, Significant Events   Consultants:   None  Procedures:   None  Antimicrobials:  Anti-infectives (From admission, onward)   Start     Dose/Rate Route Frequency Ordered Stop   10/01/20 1400  remdesivir 100 mg in sodium chloride 0.9 % 100 mL IVPB        100 mg 200 mL/hr over 30 Minutes Intravenous  Once 10/01/20 1215 10/01/20 1426        Objective   Vitals:   10/03/20 2241 10/04/20 0014 10/04/20 0518 10/04/20 0739  BP: 115/70 (!) 148/78 (!) 163/99 127/80  Pulse: 77 78 65 67  Resp: (!) 22 18 16 20   Temp: 97.7 F (36.5 C) 97.9 F (36.6 C) 98 F (36.7 C) 97.7 F (36.5 C)  TempSrc: Oral Oral Oral   SpO2: 92% 95% 96% 91%  Weight:      Height:       No intake or output data in the 24 hours ending 10/04/20 0813 Filed Weights   10/01/20 1039  Weight: 117.9 kg    Physical Exam:  General exam: awake, alert, no acute distress Respiratory system: CTAB  but diminished, no wheezes, rales or rhonchi, normal respiratory effort, coughs with deep inspiration. Cardiovascular system: normal S1/S2, RRR, no pedal edema.   Gastrointestinal system: soft, NT, ND Central nervous system: A&O x3. no gross focal neurologic deficits, normal speech Extremities: moves all, no cyanosis, normal tone Psychiatry: normal mood, congruent affect, judgement and insight appear normal  Labs   Data Reviewed: I have personally reviewed following labs and imaging studies  CBC: Recent Labs  Lab 09/27/20 1041 09/27/20 1041 09/28/20 0441 09/29/20 0643 09/30/20 0415 10/01/20 1046 10/02/20 0843  WBC 9.3   < > 6.2 6.7 4.1 14.7* 9.8  NEUTROABS 7.7  --  4.7 5.0 3.4  --  8.6*  HGB 13.2   < > 12.2* 12.2* 12.6* 12.2* 12.5*  HCT 40.2   < > 36.4* 37.1* 38.1* 36.2* 37.9*  MCV 78.5*   < >  78.6* 79.1* 77.1* 75.3* 76.7*  PLT 137*   < > 127* 187 198 310 324   < > = values in this interval not displayed.   Basic Metabolic Panel: Recent Labs  Lab 09/27/20 1041 09/27/20 1041 09/28/20 0441 09/29/20 0643 09/30/20 0415 10/01/20 1046 10/02/20 0843  NA 133*   < > 137 132* 132* 133* 138  K 4.3   < > 4.2 4.4 4.8 4.3 4.1  CL 98   < > 103 99 100 100 106  CO2 22   < > 25 23 21* 22 22  GLUCOSE 133*   < > 112* 107* 237* 250* 184*  BUN 25*   < > 21 24* 27* 40* 29*  CREATININE 2.00*   < > 1.39* 1.42* 1.33* 1.57* 1.24  CALCIUM 8.7*   < > 8.4* 8.5* 8.8* 8.9 8.9  MG 2.0  --  2.1  --   --   --  2.2  PHOS  --   --  4.0  --   --   --  3.0   < > = values in this interval not displayed.   GFR: Estimated Creatinine Clearance: 78.5 mL/min (by C-G formula based on SCr of 1.24 mg/dL). Liver Function Tests: Recent Labs  Lab 09/27/20 1041 09/28/20 0441 09/29/20 0643 09/30/20 0415 10/02/20 0843  AST 35 41 51* 43* 37  ALT 16 19 24 26 29   ALKPHOS 49 45 51 51 51  BILITOT 1.1 0.7 0.8 0.7 0.7  PROT 7.7 6.9 7.3 7.2 6.9  ALBUMIN 3.7 3.2* 3.3* 3.1* 3.2*   No results for input(s): LIPASE,  AMYLASE in the last 168 hours. No results for input(s): AMMONIA in the last 168 hours. Coagulation Profile: Recent Labs  Lab 09/27/20 1041  INR 1.1   Cardiac Enzymes: No results for input(s): CKTOTAL, CKMB, CKMBINDEX, TROPONINI in the last 168 hours. BNP (last 3 results) No results for input(s): PROBNP in the last 8760 hours. HbA1C: Recent Labs    10/03/20 0511  HGBA1C 7.7*   CBG: Recent Labs  Lab 10/02/20 2110 10/03/20 0809 10/03/20 1201 10/03/20 1512 10/03/20 2015  GLUCAP 149* 174* 145* 142* 155*   Lipid Profile: No results for input(s): CHOL, HDL, LDLCALC, TRIG, CHOLHDL, LDLDIRECT in the last 72 hours. Thyroid Function Tests: No results for input(s): TSH, T4TOTAL, FREET4, T3FREE, THYROIDAB in the last 72 hours. Anemia Panel: Recent Labs    10/02/20 0843  FERRITIN 338*   Sepsis Labs: Recent Labs  Lab 09/27/20 1041 09/27/20 1049 09/27/20 1423 09/29/20 0643  PROCALCITON 0.30  --   --  0.33  LATICACIDVEN  --  1.6 1.1  --     Recent Results (from the past 240 hour(s))  Respiratory Panel by RT PCR (Flu A&B, Covid) - Nasopharyngeal Swab     Status: Abnormal   Collection Time: 09/27/20 10:05 AM   Specimen: Nasopharyngeal Swab  Result Value Ref Range Status   SARS Coronavirus 2 by RT PCR POSITIVE (A) NEGATIVE Final    Comment: RESULT CALLED TO, READ BACK BY AND VERIFIED WITH: [facility] REGISTER 09/27/20 1241 KLW (NOTE) SARS-CoV-2 target nucleic acids are DETECTED.  SARS-CoV-2 RNA is generally detectable in upper respiratory specimens  during the acute phase of infection. Positive results are indicative of the presence of the identified virus, but do not rule out bacterial infection or co-infection with other pathogens not detected by the test. Clinical correlation with patient history and other diagnostic information is necessary to determine patient infection status. The  expected result is Negative.  Fact Sheet for Patients:    PinkCheek.be  Fact Sheet for Healthcare Providers: GravelBags.it  This test is not yet approved or cleared by the [place] FDA and  has been authorized for detection and/or diagnosis of SARS-CoV-2 by FDA under an Emergency Use Authorization (EUA).  This EUA will remain in effect (meaning this test can be use d) for the duration of  the COVID-19 declaration under Section 564(b)(1) of the Act, 21 U.S.C. section 360bbb-3(b)(1), unless the authorization is terminated or revoked sooner.      Influenza A by PCR NEGATIVE NEGATIVE Final   Influenza B by PCR NEGATIVE NEGATIVE Final    Comment: (NOTE) The Xpert Xpress SARS-CoV-2/FLU/RSV assay is intended as an aid in  the diagnosis of influenza from Nasopharyngeal swab specimens and  should not be used as a sole basis for treatment. Nasal washings and  aspirates are unacceptable for Xpert Xpress SARS-CoV-2/FLU/RSV  testing.  Fact Sheet for Patients: PinkCheek.be  Fact Sheet for Healthcare Providers: GravelBags.it  This test is not yet approved or cleared by the [place] FDA and  has been authorized for detection and/or diagnosis of SARS-CoV-2 by  FDA under an Emergency Use Authorization (EUA). This EUA will remain  in effect (meaning this test can be used) for the duration of the  Covid-19 declaration under Section 564(b)(1) of the Act, 21  U.S.C. section 360bbb-3(b)(1), unless the authorization is  terminated or revoked. Performed at [facility], [address] [place], [place] [address]   Blood culture (routine x 2)     Status: None   Collection Time: 09/27/20 10:47 AM   Specimen: BLOOD  Result Value Ref Range Status   Specimen Description BLOOD LEFT ANTECUBITAL  Final   Special Requests   Final    BOTTLES DRAWN AEROBIC AND ANAEROBIC Blood Culture adequate volume   Culture   Final    NO  GROWTH 5 DAYS Performed at [facility], [address] [place], [place] [address]    Report Status 10/02/2020 FINAL  Final  Blood culture (routine x 2)     Status: None   Collection Time: 09/27/20 10:47 AM   Specimen: BLOOD  Result Value Ref Range Status   Specimen Description BLOOD BLOOD RIGHT HAND  Final   Special Requests   Final    BOTTLES DRAWN AEROBIC AND ANAEROBIC Blood Culture adequate volume   Culture   Final    NO GROWTH 5 DAYS Performed at [facility], [address] [place], [place] [address]    Report Status 10/02/2020 FINAL  Final      Imaging Studies   No results found.   Medications   Scheduled Meds: . vitamin C  500 mg Oral Daily  . aspirin EC  81 mg Oral Daily  . benzonatate  100 mg Oral TID  . citalopram  40 mg Oral Daily  . clopidogrel  75 mg Oral QHS  . dexamethasone  6 mg Oral Q24H  . enoxaparin (LOVENOX) injection  0.5 mg/kg Subcutaneous Q24H  . famotidine  20 mg Oral BID  . fluticasone  2 spray Each Nare Daily  . insulin aspart  0-20 Units Subcutaneous TID WC  . insulin detemir  0.15 Units/kg Subcutaneous BID  . pravastatin  20 mg Oral QPM  . traZODone  50 mg Oral QHS  . zinc sulfate  220 mg Oral Daily   Continuous Infusions:     LOS: 3 days  Time spent: 30 minutes    Ezekiel Slocumb, DO [facility]  10/04/2020, 8:13 AM    If 7PM-7AM, please contact night-coverage. How to contact the [facility] Attending or Consulting provider [place] or covering provider during after hours [place], for this patient?    1. Check the care team in [facility] and look for a) attending/consulting [facility] provider listed and b) the [facility] team listed 2. Log into [email or web address] and use [place]'s universal password to access. If you do not have the password, please contact the hospital operator. 3. Locate the [facility] provider you are looking for under [facility] and page to a number that you can be directly reached. 4. If you still  have difficulty reaching the provider, please page the [facility] (Director on Call) for the Hospitalists listed on amion for assistance.

## 2020-10-05 LAB — BASIC METABOLIC PANEL
Anion gap: 8 (ref 5–15)
BUN: 19 mg/dL (ref 8–23)
CO2: 25 mmol/L (ref 22–32)
Calcium: 8.7 mg/dL — ABNORMAL LOW (ref 8.9–10.3)
Chloride: 105 mmol/L (ref 98–111)
Creatinine, Ser: 1.06 mg/dL (ref 0.61–1.24)
GFR, Estimated: >60 mL/min (ref >60)
Glucose, Bld: 187 mg/dL — ABNORMAL HIGH (ref 70–99)
Potassium: 4.6 mmol/L (ref 3.5–5.1)
Sodium: 138 mmol/L (ref 135–145)

## 2020-10-05 LAB — GLUCOSE, CAPILLARY
Glucose-Capillary: 206 mg/dL — ABNORMAL HIGH (ref 70–99)
Glucose-Capillary: 147 mg/dL — ABNORMAL HIGH (ref 70–99)

## 2020-10-05 LAB — C-REACTIVE PROTEIN: CRP: 4.6 mg/dL — ABNORMAL HIGH (ref <1.0)

## 2020-10-05 MED ORDER — INSULIN ASPART 100 UNIT/ML ~~LOC~~ SOLN
4.0000 [IU] | Freq: Three times a day (TID) | SUBCUTANEOUS | Status: DC
  Administered 2020-10-05: 12:00:00 4 [IU] via SUBCUTANEOUS
  Filled 2020-10-05: qty 1

## 2020-10-05 MED ORDER — PANTOPRAZOLE SODIUM 40 MG PO TBEC
40.0000 mg | DELAYED_RELEASE_TABLET | Freq: Every day | ORAL | Status: DC
  Administered 2020-10-05: 40 mg via ORAL
  Filled 2020-10-05: qty 1

## 2020-10-05 MED ORDER — METOPROLOL SUCCINATE ER 50 MG PO TB24: 50.0000 mg | ORAL_TABLET | Freq: Every day | ORAL | Status: DC

## 2020-10-05 MED ORDER — HYDRALAZINE HCL 20 MG/ML IJ SOLN
10.0000 mg | Freq: Four times a day (QID) | INTRAMUSCULAR | Status: DC | PRN
  Administered 2020-10-05: 07:00:00 10 mg via INTRAVENOUS
  Filled 2020-10-05: qty 1

## 2020-10-05 MED ORDER — FUROSEMIDE 40 MG PO TABS
40.0000 mg | ORAL_TABLET | Freq: Every day | ORAL | Status: DC
  Administered 2020-10-05: 40 mg via ORAL
  Filled 2020-10-05: qty 1

## 2020-10-05 MED ORDER — TRAMADOL HCL 50 MG PO TABS: 50.0000 mg | ORAL_TABLET | Freq: Four times a day (QID) | ORAL | Status: DC | PRN

## 2020-10-05 MED ORDER — BENZONATATE 100 MG PO CAPS
100.0000 mg | ORAL_CAPSULE | Freq: Three times a day (TID) | ORAL | 0 refills | Status: DC
Start: 2020-10-05 — End: 2020-10-19

## 2020-10-05 MED ORDER — INSULIN ASPART 100 UNIT/ML FLEXPEN: 5.0000 [IU] | PEN_INJECTOR | Freq: Three times a day (TID) | SUBCUTANEOUS | 2 refills | Status: DC

## 2020-10-05 MED ORDER — GUAIFENESIN-DM 100-10 MG/5ML PO SYRP: 10.0000 mL | ORAL_SOLUTION | ORAL | 0 refills | Status: DC | PRN

## 2020-10-05 MED ORDER — INSULIN DETEMIR 100 UNIT/ML FLEXPEN: 18.0000 [IU] | PEN_INJECTOR | Freq: Two times a day (BID) | SUBCUTANEOUS | 1 refills | Status: DC

## 2020-10-05 MED ORDER — ISOSORBIDE MONONITRATE ER 30 MG PO TB24: 30.0000 mg | ORAL_TABLET | Freq: Every day | ORAL | Status: DC

## 2020-10-05 NOTE — Discharge Instructions (Signed)
Sliding Scale for Mealtime Novolog (short-acting insulin):   For 70-120: none extra For 121-150: 3 units For 151-200: 4 units For 201-250: 7 units For 251-300: 11 units For 301-350: 15 units For 351-400: 20 units If above 400, take extra 20 units and call your doctor.

## 2020-10-05 NOTE — Discharge Summary (Signed)
Physician Discharge Summary  Steven Vang BOF:[id number] DOB: 1956-07-16 DOA: 10/01/2020  PCP: Eulogio Bear, NP  Admit date: 10/01/2020 Discharge date: 10/11/2020  Admitted From: home Disposition:  home  Recommendations for Outpatient Follow-up:  1. Follow up with PCP in 1-2 weeks 2. Please obtain BMP/CBC in one week 3. Please follow up on glycemic control and insulin requirements as steroids are tapered  Home Health: No  Equipment/Devices: No   Discharge Condition: Stable  CODE STATUS: [place]  Diet recommendation: Heart Healthy / Carb Modified   Discharge Diagnoses: Principal Problem:   Pneumonia due to COVID-19 virus Active Problems:   Anxiety disorder   Chronic systolic heart failure (HCC)   Coronary artery disease   Type 2 diabetes mellitus without complication, without long-term current use of insulin (HCC)   Depression   Obesity (BMI 30-39.9)   Acute respiratory failure due to COVID-19 [facility])    Summary of HPI and Hospital Course:  Steven Vang is a 64 y.o. male with a history of CAD, ICM, HFrEF, HTN, HLD, T2DM, obesity and covid-19 diagnosed 10/8 who was admitted for covid pneumonia, discharged on 10/16 to complete remdesivir 10/17 and steroids by mouth at home. He felt poorly, coughing a lot and felt very lightheaded at home. His blood sugars were elevated, so he returned to the ED 10/17. Insulin was restarted, Hb A1c is pending, and insulin teaching is underway. The patient remains on steroids with inflammatory marker elevation, though this is improving.    Type 2 diabetes /steroid-induced hyperglycemia -most recent A1c prior to admission was 6.8%.  Repeat A1c now 7.7%. Previously on only Metformin at home per chart review.  Patient was discharged home from admission for COVID-19 on steroids but not sent with insulin.   Blood glucose at home reached three hundreds, was 250 on presentation. Currently on Levemir 18 units twice daily and Novolog 5 units TID  WC. CBGs have been at inpatient goal. Diabetes coordinator followed.   Provided insulin on discharge until steroid-induced hyperglycemia has resolved. Insulin education provided.  Patient's wife is RN and able to assist patient if needed.  Hypotension -10/19-20, patient's blood pressure dropped to 77/56.  Required IV albumin.  BP was closely monitored for another 24 hours to ensure stability for safe discharge home.  BP has since remained stable.  Acute respiratory failure with hypoxia due to COVID-19 pneumonia -initially tested positive at [facility] on 09/22/2020.  Chest x-ray in the ED on 10/17 was stable from previous.   Completed remdesivir 10/13 -10/17. Continued antitussives for ongoing severe cough Hypoxia was resolved this admission and tapering off steroids. Dexamethasone 6 mg p.o. daily scheduled to end tomorrow 10/22. Weight-based Lovenox was used for DVT prophylaxis Pepcid for GI protection while on steroids Supportive care with antitussives Tussionex, Robitussin-DM, Tessalon Perles Vitamin C and zinc   Acute kidney injury -resolved.  Present on admission in the setting of hypotension, most likely prerenal azotemia.  Follow up BMP outpatient.  Hyponatremia -resolved  Coronary artery disease -stable, no active chest pain or troponin elevation.   Left heart cath in July 2021 showed patent LAD stent, high-grade 1 diagonal, medical management was recommended. Continue DAPT and statin  Ischemic cardiomyopathy / Chronic HFrEF -currently appears euvolemic.  On chart review most recent echo was [facility] in July 2019, showed EF 77%, grade 2 diastolic dysfunction., no EF listed on LHC report.   Hypertension -  stable  Resumed on ARB, Imdur, metoprolol  Sinus bradycardia - monitored on telemetry.  Metoprolol was decreased from 50 mg to 25 mg, able to resume 50 mg dose.  Obesity: Body mass index is 37.31 kg/m.  Complicates overall care and prognosis.  Recommend lifestyle modifications  including diet and exercise for weight loss.     Discharge Instructions   Discharge Instructions    (HEART FAILURE PATIENTS) Call MD:  Anytime you have any of the following symptoms: 1) 3 pound weight gain in 24 hours or 5 pounds in 1 week 2) shortness of breath, with or without a dry hacking cough 3) swelling in the hands, feet or stomach 4) if you have to sleep on extra pillows at night in order to breathe.   Complete by: As directed    Call MD for:  extreme fatigue   Complete by: As directed    Call MD for:  persistant dizziness or light-headedness   Complete by: As directed    Call MD for:  severe uncontrolled pain   Complete by: As directed    Call MD for:  temperature >100.4   Complete by: As directed    Diet - low sodium heart healthy   Complete by: As directed    Discharge instructions   Complete by: As directed    Please keep a close eye on your blood sugars at home.  You are done taking steroids, which led to your sugar being uncontrolled.  Now that steroids are finished, your blood sugar should begin to improve.  If it improves rapidly, you will not need as much insulin as I'm prescribing right now for discharge.   Please follow up closely with your Primary Care doctor about the insulin, and when to restart taking metformin.  Losartan - I am hold for now because your BP would drop too low.  Please check your BP at home twice a day between now and when you see your doctor in follow up.  If your systolic BP (top #) is lower than 573 or diastolic BP (bottom#) is less than 60, you can hold Lasix, metoprolol, and Imdur to prevent dropping your BP too much.   If you have dizziness, lightheadedness, check your BP.   Increase activity slowly   Complete by: As directed      Allergies as of 10/05/2020   No Known Allergies     Medication List    STOP taking these medications   dexamethasone 6 MG tablet Commonly known as: Decadron   losartan 50 MG tablet Commonly known as:  COZAAR   metFORMIN 1000 MG tablet Commonly known as: GLUCOPHAGE     TAKE these medications   aspirin 81 MG EC tablet Take 1 tablet by mouth daily.   benzonatate 100 MG capsule Commonly known as: TESSALON Take 1 capsule (100 mg total) by mouth 3 (three) times daily.   citalopram 40 MG tablet Commonly known as: CELEXA Take 1 tablet (40 mg total) by mouth daily.   clopidogrel 75 MG tablet Commonly known as: PLAVIX Take 1 tablet (75 mg total) by mouth daily. What changed: when to take this   famotidine 20 MG tablet Commonly known as: PEPCID Take 1 tablet (20 mg total) by mouth 2 (two) times daily.   fluticasone 50 MCG/ACT nasal spray Commonly known as: FLONASE Place 2 sprays into both nostrils daily.   furosemide 40 MG tablet Commonly known as: LASIX Take 40 mg by mouth daily.   gabapentin 300 MG capsule Commonly known as: NEURONTIN Take 1-2 capsules (300-600 mg total) by mouth at bedtime  as needed. What changed: how much to take   guaiFENesin-dextromethorphan 100-10 MG/5ML syrup Commonly known as: ROBITUSSIN DM Take 10 mLs by mouth every 4 (four) hours as needed for cough.   insulin aspart 100 UNIT/ML FlexPen Commonly known as: NOVOLOG Inject 5 Units into the skin 3 (three) times daily with meals. Add additional units per sliding scale provided.   insulin detemir 100 UNIT/ML FlexPen Commonly known as: LEVEMIR Inject 18 Units into the skin 2 (two) times daily.   isosorbide mononitrate 30 MG 24 hr tablet Commonly known as: IMDUR Take 30 mg by mouth at bedtime.   metoprolol succinate 50 MG 24 hr tablet Commonly known as: TOPROL-XL Take 50 mg by mouth at bedtime.   pantoprazole 40 MG tablet Commonly known as: PROTONIX Take 1 tablet (40 mg total) by mouth daily.   pravastatin 40 MG tablet Commonly known as: PRAVACHOL Take 0.5 tablets (20 mg total) by mouth every evening.   traZODone 50 MG tablet Commonly known as: DESYREL Take 1 tablet (50 mg total) by  mouth at bedtime. PRN What changed: additional instructions       No Known Allergies  Consultations:  None    Procedures/Studies: CT Angio Chest PE W and/or Wo Contrast  Result Date: 09/27/2020 CLINICAL DATA:  Cough, shortness of breath.  COVID-19 positive EXAM: CT ANGIOGRAPHY CHEST WITH CONTRAST TECHNIQUE: Multidetector CT imaging of the chest was performed using the standard protocol during bolus administration of intravenous contrast. Multiplanar CT image reconstructions and MIPs were obtained to evaluate the vascular anatomy. CONTRAST:  64mL OMNIPAQUE IOHEXOL 350 MG/ML SOLN COMPARISON:  Same day chest x-ray FINDINGS: Cardiovascular: Satisfactory opacification of the pulmonary arteries to the segmental level. No evidence of pulmonary embolism. Thoracic aorta is nonaneurysmal. Minimal atherosclerotic calcification of the aortic arch. Coronary artery stent is noted. Normal heart size. No pericardial effusion. Mediastinum/Nodes: Mildly prominent subcarinal and right hilar lymph nodes. No axillary lymphadenopathy. Thyroid, trachea, and esophagus within normal limits. Lungs/Pleura: Patchy posterior right lower lobe airspace consolidation with surrounding ground-glass opacity. Additional multifocal areas of predominantly ground-glass opacity within the peripheral aspects of both lung fields. No pleural effusion. No pneumothorax. There are a few small scattered calcified granulomas within the left upper lobe. Upper Abdomen: No acute abnormality. Musculoskeletal: No chest wall abnormality. No acute or significant osseous findings. Review of the MIP images confirms the above findings. IMPRESSION: 1. No evidence of pulmonary embolism. 2. Patchy posterior right lower lobe airspace consolidation. Additional multifocal areas of predominantly ground-glass opacity within the peripheral aspects of both lung fields. Findings are suspicious for multifocal pneumonia in the setting of COVID-19 infection. 3. Mildly  prominent subcarinal and right hilar lymph nodes, likely reactive. Electronically Signed   By: Davina Poke D.O.   On: 09/27/2020 13:27   DG Chest Portable 1 View  Result Date: 10/01/2020 CLINICAL DATA:  Shortness of.  COVID-19 positive EXAM: PORTABLE CHEST 1 VIEW COMPARISON:  September 29, 2020. FINDINGS: There is patchy opacity in the lung bases, similar to recent study. Lungs elsewhere clear. Heart is upper normal in size with pulmonary vascularity normal. No adenopathy. No bone lesions. IMPRESSION: Bibasilar airspace opacity consistent with multifocal pneumonia. Suspect atypical organism etiology given the history and radiograph appearance. Heart size upper normal. No adenopathy. Electronically Signed   By: Lowella Grip III M.D.   On: 10/01/2020 12:26   DG Chest Port 1 View  Result Date: 09/29/2020 CLINICAL DATA:  Acute hypoxemic respiratory failure due to COVID 19 EXAM: PORTABLE CHEST 1  VIEW COMPARISON:  09/27/2020 FINDINGS: Mild patchy bibasilar airspace disease has progressed in the interval. Upper lobes appear clear. Heart size and vascularity normal. No effusion. IMPRESSION: Progression of patchy bibasilar airspace disease compatible with COVID pneumonia. Electronically Signed   By: Franchot Gallo M.D.   On: 09/29/2020 11:23   DG Chest Port 1 View  Result Date: 09/27/2020 CLINICAL DATA:  Possible sepsis. COVID positive with shortness of breath, body aches and cough. EXAM: PORTABLE CHEST 1 VIEW COMPARISON:  Chest radiograph 07/01/2014 FINDINGS: Low lung volumes, which limits evaluation. No focal consolidation. Cardiac silhouette is within normal limits and mildly accentuated by low lung volumes and portable technique. No visible pleural effusions pneumothorax. No acute osseous abnormality. IMPRESSION: No evidence of acute cardiopulmonary disease on this AP portable radiograph with low lung volumes. Electronically Signed   By: Margaretha Sheffield MD   On: 09/27/2020 11:02        Subjective: Pt seen this AM, feeling well.  Ready to go home.  Cough continues but little better.  Confident in use of insulin at home and agrees to close follow up.   Discharge Exam: Vitals:   10/05/20 0854 10/05/20 1201  BP: 111/71 (!) 133/96  Pulse: 67 71  Resp: 16 18  Temp: (!) 96.3 F (35.7 C) 99.2 F (37.3 C)  SpO2: 96% 94%   Vitals:   10/05/20 0502 10/05/20 0646 10/05/20 0854 10/05/20 1201  BP: (!) 169/106 (!) 177/105 111/71 (!) 133/96  Pulse: 75 60 67 71  Resp: 20 18 16 18   Temp: 97.8 F (36.6 C)  (!) 96.3 F (35.7 C) 99.2 F (37.3 C)  TempSrc: Oral  Axillary Oral  SpO2: 92% 93% 96% 94%  Weight:      Height:        General: Pt is alert, awake, not in acute distress, obese Cardiovascular: RRR, S1/S2 +, no rubs, no gallops Respiratory: CTA bilaterally, no wheezing, no rhonchi Abdominal: Soft, NT, ND, bowel sounds + Extremities: no edema, no cyanosis    The results of significant diagnostics from this hospitalization (including imaging, microbiology, ancillary and laboratory) are listed below for reference.     Microbiology: No results found for this or any previous visit (from the past 240 hour(s)).   Labs: BNP (last 3 results) Recent Labs    09/27/20 1041  BNP 16.1   Basic Metabolic Panel: Recent Labs  Lab 10/05/20 0634  NA 138  K 4.6  CL 105  CO2 25  GLUCOSE 187*  BUN 19  CREATININE 1.06  CALCIUM 8.7*   Liver Function Tests: No results for input(s): AST, ALT, ALKPHOS, BILITOT, PROT, ALBUMIN in the last 168 hours. No results for input(s): LIPASE, AMYLASE in the last 168 hours. No results for input(s): AMMONIA in the last 168 hours. CBC: No results for input(s): WBC, NEUTROABS, HGB, HCT, MCV, PLT in the last 168 hours. Cardiac Enzymes: No results for input(s): CKTOTAL, CKMB, CKMBINDEX, TROPONINI in the last 168 hours. BNP: Invalid input(s): POCBNP CBG: Recent Labs  Lab 10/04/20 1141 10/04/20 1540 10/04/20 2027  10/05/20 0945 10/05/20 1201  GLUCAP 228* 148* 226* 206* 147*   D-Dimer No results for input(s): DDIMER in the last 72 hours. Hgb A1c No results for input(s): HGBA1C in the last 72 hours. Lipid Profile No results for input(s): CHOL, HDL, LDLCALC, TRIG, CHOLHDL, LDLDIRECT in the last 72 hours. Thyroid function studies No results for input(s): TSH, T4TOTAL, T3FREE, THYROIDAB in the last 72 hours.  Invalid input(s): FREET3 Anemia work  up No results for input(s): VITAMINB12, FOLATE, FERRITIN, TIBC, IRON, RETICCTPCT in the last 72 hours. Urinalysis No results found for: COLORURINE, APPEARANCEUR, LABSPEC, [place], GLUCOSEU, HGBUR, BILIRUBINUR, KETONESUR, PROTEINUR, UROBILINOGEN, NITRITE, LEUKOCYTESUR Sepsis Labs Invalid input(s): PROCALCITONIN,  WBC,  LACTICIDVEN Microbiology No results found for this or any previous visit (from the past 240 hour(s)).   Time coordinating discharge: Over 30 minutes  SIGNED:   Ezekiel Slocumb, DO [facility] 10/11/2020, 9:15 AM   If 7PM-7AM, please contact night-coverage [email or web address]

## 2020-10-05 NOTE — Progress Notes (Signed)
Patient discharged to home with his belongings and AVS via wheelchair with transport

## 2020-10-11 ENCOUNTER — Telehealth: Payer: Self-pay

## 2020-10-11 NOTE — Telephone Encounter (Signed)
Called to schedule hosp f/u per [name] no answer left vm

## 2020-10-15 ENCOUNTER — Inpatient Hospital Stay
Admission: EM | Admit: 2020-10-15 | Discharge: 2020-10-19 | DRG: 193 | Disposition: A | Discharge status: Home / Self Care | Payer: 59 | Attending: Hospitalist | Admitting: Hospitalist

## 2020-10-15 ENCOUNTER — Other Ambulatory Visit: Payer: Self-pay

## 2020-10-15 ENCOUNTER — Emergency Department: Payer: 59

## 2020-10-15 DIAGNOSIS — F330 Major depressive disorder, recurrent, mild: Secondary | ICD-10-CM | POA: Diagnosis not present

## 2020-10-15 DIAGNOSIS — I2510 Atherosclerotic heart disease of native coronary artery without angina pectoris: Secondary | ICD-10-CM | POA: Diagnosis present

## 2020-10-15 DIAGNOSIS — E6609 Other obesity due to excess calories: Secondary | ICD-10-CM | POA: Diagnosis present

## 2020-10-15 DIAGNOSIS — Z955 Presence of coronary angioplasty implant and graft: Secondary | ICD-10-CM | POA: Diagnosis not present

## 2020-10-15 DIAGNOSIS — Z8616 Personal history of COVID-19: Secondary | ICD-10-CM

## 2020-10-15 DIAGNOSIS — K219 Gastro-esophageal reflux disease without esophagitis: Secondary | ICD-10-CM | POA: Diagnosis present

## 2020-10-15 DIAGNOSIS — Z6835 Body mass index (BMI) 35.0-35.9, adult: Secondary | ICD-10-CM

## 2020-10-15 DIAGNOSIS — R042 Hemoptysis: Secondary | ICD-10-CM | POA: Diagnosis present

## 2020-10-15 DIAGNOSIS — E1165 Type 2 diabetes mellitus with hyperglycemia: Secondary | ICD-10-CM | POA: Diagnosis present

## 2020-10-15 DIAGNOSIS — F419 Anxiety disorder, unspecified: Secondary | ICD-10-CM | POA: Diagnosis present

## 2020-10-15 DIAGNOSIS — Z79899 Other long term (current) drug therapy: Secondary | ICD-10-CM

## 2020-10-15 DIAGNOSIS — I959 Hypotension, unspecified: Secondary | ICD-10-CM | POA: Diagnosis present

## 2020-10-15 DIAGNOSIS — Y95 Nosocomial condition: Secondary | ICD-10-CM | POA: Diagnosis present

## 2020-10-15 DIAGNOSIS — F32A Depression, unspecified: Secondary | ICD-10-CM | POA: Diagnosis present

## 2020-10-15 DIAGNOSIS — Z8249 Family history of ischemic heart disease and other diseases of the circulatory system: Secondary | ICD-10-CM

## 2020-10-15 DIAGNOSIS — Z833 Family history of diabetes mellitus: Secondary | ICD-10-CM

## 2020-10-15 DIAGNOSIS — Z7902 Long term (current) use of antithrombotics/antiplatelets: Secondary | ICD-10-CM

## 2020-10-15 DIAGNOSIS — Z7982 Long term (current) use of aspirin: Secondary | ICD-10-CM

## 2020-10-15 DIAGNOSIS — E782 Mixed hyperlipidemia: Secondary | ICD-10-CM | POA: Diagnosis present

## 2020-10-15 DIAGNOSIS — Z794 Long term (current) use of insulin: Secondary | ICD-10-CM

## 2020-10-15 DIAGNOSIS — I25110 Atherosclerotic heart disease of native coronary artery with unstable angina pectoris: Secondary | ICD-10-CM

## 2020-10-15 DIAGNOSIS — U071 COVID-19: Secondary | ICD-10-CM

## 2020-10-15 DIAGNOSIS — I255 Ischemic cardiomyopathy: Secondary | ICD-10-CM | POA: Diagnosis present

## 2020-10-15 DIAGNOSIS — R509 Fever, unspecified: Secondary | ICD-10-CM

## 2020-10-15 DIAGNOSIS — Z800 Family history of malignant neoplasm of digestive organs: Secondary | ICD-10-CM

## 2020-10-15 DIAGNOSIS — E119 Type 2 diabetes mellitus without complications: Secondary | ICD-10-CM | POA: Diagnosis not present

## 2020-10-15 DIAGNOSIS — I5022 Chronic systolic (congestive) heart failure: Secondary | ICD-10-CM | POA: Diagnosis present

## 2020-10-15 DIAGNOSIS — Z803 Family history of malignant neoplasm of breast: Secondary | ICD-10-CM | POA: Diagnosis not present

## 2020-10-15 DIAGNOSIS — J9621 Acute and chronic respiratory failure with hypoxia: Secondary | ICD-10-CM | POA: Diagnosis present

## 2020-10-15 DIAGNOSIS — N179 Acute kidney failure, unspecified: Secondary | ICD-10-CM | POA: Diagnosis present

## 2020-10-15 DIAGNOSIS — E1140 Type 2 diabetes mellitus with diabetic neuropathy, unspecified: Secondary | ICD-10-CM

## 2020-10-15 DIAGNOSIS — R0902 Hypoxemia: Secondary | ICD-10-CM

## 2020-10-15 DIAGNOSIS — Z87891 Personal history of nicotine dependence: Secondary | ICD-10-CM

## 2020-10-15 DIAGNOSIS — I110 Hypertensive heart disease with heart failure: Secondary | ICD-10-CM | POA: Diagnosis present

## 2020-10-15 DIAGNOSIS — J189 Pneumonia, unspecified organism: Principal | ICD-10-CM | POA: Diagnosis present

## 2020-10-15 DIAGNOSIS — E860 Dehydration: Secondary | ICD-10-CM | POA: Diagnosis present

## 2020-10-15 LAB — CBC WITH DIFFERENTIAL/PLATELET
Abs Immature Granulocytes: 0.07 10*3/uL (ref 0.00–0.07)
Basophils Absolute: 0.1 10*3/uL (ref 0.0–0.1)
Basophils Relative: 1 %
Eosinophils Absolute: 0 10*3/uL (ref 0.0–0.5)
Eosinophils Relative: 0 %
HCT: 32 % — ABNORMAL LOW (ref 39.0–52.0)
Hemoglobin: 10.2 g/dL — ABNORMAL LOW (ref 13.0–17.0)
Immature Granulocytes: 1 %
Lymphocytes Relative: 7 %
Lymphs Abs: 0.7 10*3/uL (ref 0.7–4.0)
MCH: 25.4 pg — ABNORMAL LOW (ref 26.0–34.0)
MCHC: 31.9 g/dL (ref 30.0–36.0)
MCV: 79.8 fL — ABNORMAL LOW (ref 80.0–100.0)
Monocytes Absolute: 0.5 10*3/uL (ref 0.1–1.0)
Monocytes Relative: 5 %
Neutro Abs: 8.8 10*3/uL — ABNORMAL HIGH (ref 1.7–7.7)
Neutrophils Relative %: 86 %
Platelets: 260 10*3/uL (ref 150–400)
RBC: 4.01 MIL/uL — ABNORMAL LOW (ref 4.22–5.81)
RDW: 13.2 % (ref 11.5–15.5)
WBC: 10.1 10*3/uL (ref 4.0–10.5)
nRBC: 0 % (ref 0.0–0.2)

## 2020-10-15 LAB — LACTIC ACID, PLASMA: Lactic Acid, Venous: 1.8 mmol/L (ref 0.5–1.9)

## 2020-10-15 LAB — EXPECTORATED SPUTUM ASSESSMENT W GRAM STAIN, RFLX TO RESP C

## 2020-10-15 LAB — PROTIME-INR
INR: 1.3 — ABNORMAL HIGH (ref 0.8–1.2)
Prothrombin Time: 15.4 seconds — ABNORMAL HIGH (ref 11.4–15.2)

## 2020-10-15 LAB — C DIFFICILE QUICK SCREEN W PCR REFLEX
C Diff antigen: NEGATIVE
C Diff interpretation: NOT DETECTED
C Diff toxin: NEGATIVE

## 2020-10-15 LAB — COMPREHENSIVE METABOLIC PANEL
ALT: 16 U/L (ref 0–44)
AST: 22 U/L (ref 15–41)
Albumin: 2.6 g/dL — ABNORMAL LOW (ref 3.5–5.0)
Alkaline Phosphatase: 79 U/L (ref 38–126)
Anion gap: 12 (ref 5–15)
BUN: 14 mg/dL (ref 8–23)
CO2: 25 mmol/L (ref 22–32)
Calcium: 8.5 mg/dL — ABNORMAL LOW (ref 8.9–10.3)
Chloride: 97 mmol/L — ABNORMAL LOW (ref 98–111)
Creatinine, Ser: 1.35 mg/dL — ABNORMAL HIGH (ref 0.61–1.24)
GFR, Estimated: 59 mL/min — ABNORMAL LOW (ref >60)
Glucose, Bld: 124 mg/dL — ABNORMAL HIGH (ref 70–99)
Potassium: 4.2 mmol/L (ref 3.5–5.1)
Sodium: 134 mmol/L — ABNORMAL LOW (ref 135–145)
Total Bilirubin: 1.2 mg/dL (ref 0.3–1.2)
Total Protein: 7.2 g/dL (ref 6.5–8.1)

## 2020-10-15 LAB — URINALYSIS, COMPLETE (UACMP) WITH MICROSCOPIC
Bilirubin Urine: NEGATIVE
Glucose, UA: NEGATIVE mg/dL
Hgb urine dipstick: NEGATIVE
Ketones, ur: 20 mg/dL — AB
Leukocytes,Ua: NEGATIVE
Nitrite: NEGATIVE
Protein, ur: 30 mg/dL — AB
Specific Gravity, Urine: >1.046 — ABNORMAL HIGH (ref 1.005–1.030)
pH: 5 (ref 5.0–8.0)

## 2020-10-15 LAB — PROCALCITONIN: Procalcitonin: 0.39 ng/mL

## 2020-10-15 LAB — TROPONIN I (HIGH SENSITIVITY)
Troponin I (High Sensitivity): 13 ng/L (ref <18)
Troponin I (High Sensitivity): 12 ng/L (ref <18)

## 2020-10-15 LAB — CBG MONITORING, ED: Glucose-Capillary: 216 mg/dL — ABNORMAL HIGH (ref 70–99)

## 2020-10-15 LAB — GLUCOSE, CAPILLARY: Glucose-Capillary: 222 mg/dL — ABNORMAL HIGH (ref 70–99)

## 2020-10-15 LAB — HIV ANTIBODY (ROUTINE TESTING W REFLEX): HIV Screen 4th Generation wRfx: NONREACTIVE

## 2020-10-15 MED ORDER — SODIUM CHLORIDE 0.9 % IV SOLN
2.0000 g | Freq: Once | INTRAVENOUS | Status: AC
  Administered 2020-10-15: 2 g via INTRAVENOUS
  Filled 2020-10-15: qty 2

## 2020-10-15 MED ORDER — ACETAMINOPHEN 500 MG PO TABS
1000.0000 mg | ORAL_TABLET | Freq: Once | ORAL | Status: AC
  Administered 2020-10-15: 1000 mg via ORAL
  Filled 2020-10-15: qty 2

## 2020-10-15 MED ORDER — FLUTICASONE PROPIONATE 50 MCG/ACT NA SUSP
2.0000 | Freq: Every day | NASAL | Status: DC
  Administered 2020-10-18 – 2020-10-19 (×2): 2 via NASAL
  Filled 2020-10-15 (×2): qty 16

## 2020-10-15 MED ORDER — ISOSORBIDE MONONITRATE ER 30 MG PO TB24
30.0000 mg | ORAL_TABLET | Freq: Every day | ORAL | Status: DC
  Administered 2020-10-15: 30 mg via ORAL
  Filled 2020-10-15: qty 1

## 2020-10-15 MED ORDER — CITALOPRAM HYDROBROMIDE 20 MG PO TABS
40.0000 mg | ORAL_TABLET | Freq: Every day | ORAL | Status: DC
  Administered 2020-10-16 – 2020-10-19 (×4): 40 mg via ORAL
  Filled 2020-10-15 (×4): qty 2

## 2020-10-15 MED ORDER — HYDROCOD POLST-CPM POLST ER 10-8 MG/5ML PO SUER
5.0000 mL | Freq: Once | ORAL | Status: AC
  Administered 2020-10-15: 5 mL via ORAL
  Filled 2020-10-15: qty 5

## 2020-10-15 MED ORDER — PANTOPRAZOLE SODIUM 40 MG PO TBEC
40.0000 mg | DELAYED_RELEASE_TABLET | Freq: Every day | ORAL | Status: DC
  Administered 2020-10-16 – 2020-10-19 (×4): 40 mg via ORAL
  Filled 2020-10-15 (×4): qty 1

## 2020-10-15 MED ORDER — DEXTROSE-NACL 5-0.9 % IV SOLN: INTRAVENOUS | Status: DC

## 2020-10-15 MED ORDER — TRAZODONE HCL 50 MG PO TABS
50.0000 mg | ORAL_TABLET | Freq: Every evening | ORAL | Status: DC | PRN
  Administered 2020-10-15 – 2020-10-18 (×4): 50 mg via ORAL
  Filled 2020-10-15 (×4): qty 1

## 2020-10-15 MED ORDER — METHYLPREDNISOLONE SODIUM SUCC 125 MG IJ SOLR
125.0000 mg | Freq: Once | INTRAMUSCULAR | Status: AC
  Administered 2020-10-15: 125 mg via INTRAVENOUS
  Filled 2020-10-15: qty 2

## 2020-10-15 MED ORDER — METOPROLOL SUCCINATE ER 50 MG PO TB24
50.0000 mg | ORAL_TABLET | Freq: Every day | ORAL | Status: DC
  Administered 2020-10-15: 50 mg via ORAL
  Filled 2020-10-15: qty 1

## 2020-10-15 MED ORDER — BENZONATATE 100 MG PO CAPS
100.0000 mg | ORAL_CAPSULE | Freq: Three times a day (TID) | ORAL | Status: DC
  Administered 2020-10-15 – 2020-10-19 (×12): 100 mg via ORAL
  Filled 2020-10-15 (×12): qty 1

## 2020-10-15 MED ORDER — SODIUM CHLORIDE 0.9 % IV SOLN
500.0000 mg | INTRAVENOUS | Status: DC
  Administered 2020-10-15 – 2020-10-19 (×4): 500 mg via INTRAVENOUS
  Filled 2020-10-15 (×5): qty 500

## 2020-10-15 MED ORDER — IOHEXOL 350 MG/ML SOLN
100.0000 mL | Freq: Once | INTRAVENOUS | Status: AC | PRN
  Administered 2020-10-15: 100 mL via INTRAVENOUS

## 2020-10-15 MED ORDER — INSULIN ASPART 100 UNIT/ML ~~LOC~~ SOLN
0.0000 [IU] | Freq: Three times a day (TID) | SUBCUTANEOUS | Status: DC
  Administered 2020-10-15 – 2020-10-16 (×3): 5 [IU] via SUBCUTANEOUS
  Administered 2020-10-16: 8 [IU] via SUBCUTANEOUS
  Administered 2020-10-17 (×2): 2 [IU] via SUBCUTANEOUS
  Filled 2020-10-15 (×5): qty 1

## 2020-10-15 MED ORDER — PRAVASTATIN SODIUM 20 MG PO TABS
20.0000 mg | ORAL_TABLET | Freq: Every evening | ORAL | Status: DC
  Administered 2020-10-15 – 2020-10-18 (×4): 20 mg via ORAL
  Filled 2020-10-15 (×5): qty 1

## 2020-10-15 MED ORDER — VANCOMYCIN HCL IN DEXTROSE 1-5 GM/200ML-% IV SOLN
1000.0000 mg | Freq: Once | INTRAVENOUS | Status: AC
  Administered 2020-10-15: 1000 mg via INTRAVENOUS
  Filled 2020-10-15: qty 200

## 2020-10-15 MED ORDER — SODIUM CHLORIDE 0.9 % IV SOLN
2.0000 g | INTRAVENOUS | Status: AC
  Administered 2020-10-15 – 2020-10-18 (×5): 2 g via INTRAVENOUS
  Filled 2020-10-15 (×3): qty 20
  Filled 2020-10-15: qty 2
  Filled 2020-10-15: qty 20

## 2020-10-15 MED ORDER — GABAPENTIN 300 MG PO CAPS
600.0000 mg | ORAL_CAPSULE | Freq: Every day | ORAL | Status: DC
  Administered 2020-10-15 – 2020-10-16 (×2): 600 mg via ORAL
  Filled 2020-10-15 (×2): qty 2

## 2020-10-15 MED ORDER — FUROSEMIDE 40 MG PO TABS
40.0000 mg | ORAL_TABLET | Freq: Every day | ORAL | Status: DC
  Administered 2020-10-15: 40 mg via ORAL
  Filled 2020-10-15: qty 1

## 2020-10-15 MED ORDER — GUAIFENESIN 100 MG/5ML PO SOLN
5.0000 mL | ORAL | Status: DC | PRN
  Administered 2020-10-15 – 2020-10-18 (×7): 100 mg via ORAL
  Filled 2020-10-15 (×6): qty 5
  Filled 2020-10-15: qty 10
  Filled 2020-10-15 (×2): qty 5
  Filled 2020-10-15: qty 10

## 2020-10-15 MED ORDER — SODIUM CHLORIDE 0.9 % IV BOLUS (SEPSIS)
1000.0000 mL | Freq: Once | INTRAVENOUS | Status: AC
  Administered 2020-10-15: 1000 mL via INTRAVENOUS

## 2020-10-15 MED ORDER — INSULIN ASPART 100 UNIT/ML ~~LOC~~ SOLN: 0.0000 [IU] | SUBCUTANEOUS | Status: DC

## 2020-10-15 NOTE — H&P (Signed)
History and Physical    Steven Vang BZJ:[id number] DOB: [birth year] DOA: 10/15/2020  PCP: Eulogio Bear, NP   Patient coming from: Home  I have personally briefly reviewed patient's old medical records in [place]  Chief Complaint: Coughing up blood  HPI: Steven Vang is a 64 y.o. male with medical history significant for coronary artery disease, diabetes mellitus, hypertension, obesity,depression and anxiety who was recently discharged from hospital after treatment for COVID-19 pneumonia.  Patient is unvaccinated and received remdesivir and Decadron during his last hospitalization He had a positive PCR test done at [facility] on[facility]/08[facility]21.  He presents today for evaluation of persistent cough and copious amounts of hemoptysis.  His wife states that since his hospital discharge about 10 days ago he has had a persistent cough.  He has hemoptysis mixed with greyish phlegm but denies having any fever or chills.   During his evaluation in the ER he was found to have a fever with a T-max of 101F with no evidence of tachycardia or tachypnea. He denies having any shortness of breath, no nausea, no vomiting, no dizziness, no lightheadedness, no abdominal pain or any urinary symptoms.  He has pleuritic chest pain from  his persistent cough. Labs show sodium 134, potassium 4.2, chloride 97, bicarb 25, glucose 124, BUN 14, creatinine 1.25, calcium 8.5, alkaline phosphatase 79, albumin 2.6, AST 22, ALT 16, total protein 7.2, troponin 13, lactic acid 1.8, white count 7.1, hemoglobin 10.2, hematocrit 32, MCV 79.8, RDW 13.2, platelet count 260 CT angiogram of the chest is negative for PE but shows focal, discrete areas of consolidation throughout the lungs with a peripheral distribution. The previously seen primarily ground-glass opacities have progressed to more consolidative appearance. This is most remarkable in the RIGHT LOWER lobe where an area of patchy airspace filling opacity now appears  as consolidation with air bronchograms. No pleural effusions. Large airways are patent.    ED Course: Patient is a 64 year old Caucasian male with multiple medical problems who has been hospitalized twice in the last 1 month for complications related to the COVID-19 virus.  He presents again today for evaluation of persistent cough and hemoptysis and CT angiogram shows a lobar pneumonia.  He received empiric antibiotic therapy with cefepime and vancomycin in the ER and  will be admitted to the hospital for further evaluation.   Review of Systems: As per HPI otherwise 10 point review of systems negative.    Past Medical History:  Diagnosis Date  . Abnormal EKG   . Anxiety disorder   . CAD (coronary artery disease)   . CHF (congestive heart failure) ([place])   . Chronic systolic heart failure ([place])   . Depression   . Diabetes mellitus ([place])   . GERD (gastroesophageal reflux disease)   . Hypertension   . Lipoma of head 03/31/2020  . Mixed hyperlipidemia   . Non morbid obesity due to excess calories   . Type 2 diabetes mellitus without complication, without long-term current use of insulin [facility])     Past Surgical History:  Procedure Laterality Date  . ANKLE FRACTURE SURGERY Right   . CARDIAC CATHETERIZATION    . CHOLECYSTECTOMY    . CORONARY STENT PLACEMENT  2015   3 stents  . HAND SURGERY Right   . LEFT HEART CATH AND CORONARY ANGIOGRAPHY Left 06/02/2020   Procedure: LEFT HEART CATH AND CORONARY ANGIOGRAPHY;  Surgeon: Dionisio David, MD;  Location: [facility];  Service: Cardiovascular;  Laterality: Left;  reports that he quit smoking about 31 years ago. His smoking use included cigarettes. He has never used smokeless tobacco. He reports previous alcohol use. He reports that he does not use drugs.  No Known Allergies  Family History  Problem Relation Age of Onset  . CAD Mother   . Diabetes Mother   . Colon cancer Father   . Breast cancer Paternal Grandmother   .  Stomach cancer Paternal Grandfather      Prior to Admission medications   Medication Sig Start Date End Date Taking? Authorizing Provider  aspirin 81 MG EC tablet Take 1 tablet by mouth daily.    [provider]  benzonatate (TESSALON) 100 MG capsule Take 1 capsule (100 mg total) by mouth 3 (three) times daily. 10/05/20   Ezekiel Slocumb, DO  citalopram (CELEXA) 40 MG tablet Take 1 tablet (40 mg total) by mouth daily. 06/23/20   Eulogio Bear, NP  clopidogrel (PLAVIX) 75 MG tablet Take 1 tablet (75 mg total) by mouth daily. Patient taking differently: Take 75 mg by mouth at bedtime.  06/23/20   Eulogio Bear, NP  famotidine (PEPCID) 20 MG tablet Take 1 tablet (20 mg total) by mouth 2 (two) times daily. 09/25/20   Eulogio Bear, NP  fluticasone (FLONASE) 50 MCG/ACT nasal spray Place 2 sprays into both nostrils daily. 06/23/20   Eulogio Bear, NP  furosemide (LASIX) 40 MG tablet Take 40 mg by mouth daily. 01/14/20   [provider]  gabapentin (NEURONTIN) 300 MG capsule Take 1-2 capsules (300-600 mg total) by mouth at bedtime as needed. Patient taking differently: Take 600 mg by mouth at bedtime as needed.  06/23/20   Eulogio Bear, NP  guaiFENesin-dextromethorphan (ROBITUSSIN DM) 100-10 MG/5ML syrup Take 10 mLs by mouth every 4 (four) hours as needed for cough. 10/05/20   Nicole Kindred A, DO  insulin aspart (NOVOLOG) 100 UNIT/ML FlexPen Inject 5 Units into the skin 3 (three) times daily with meals. Add additional units per sliding scale provided. 10/05/20   Nicole Kindred A, DO  insulin detemir (LEVEMIR) 100 UNIT/ML FlexPen Inject 18 Units into the skin 2 (two) times daily. 10/05/20   Ezekiel Slocumb, DO  isosorbide mononitrate (IMDUR) 30 MG 24 hr tablet Take 30 mg by mouth at bedtime.  06/16/20 10/01/20  [provider]  metoprolol succinate (TOPROL-XL) 50 MG 24 hr tablet Take 50 mg by mouth at bedtime.  06/08/20   [provider]    pantoprazole (PROTONIX) 40 MG tablet Take 1 tablet (40 mg total) by mouth daily. 09/25/20   Eulogio Bear, NP  pravastatin (PRAVACHOL) 40 MG tablet Take 0.5 tablets (20 mg total) by mouth every evening. 06/23/20   Eulogio Bear, NP  traZODone (DESYREL) 50 MG tablet Take 1 tablet (50 mg total) by mouth at bedtime. PRN Patient taking differently: Take 50 mg by mouth at bedtime.  06/23/20   Eulogio Bear, NP    Physical Exam: Vitals:   10/15/20 1011 10/15/20 1012 10/15/20 1200 10/15/20 1253  BP:   (!) 113/57 (!) 121/53  Pulse:   97 85  Resp:   18 (!) 24  Temp:  (!) 101.1 F (38.4 C)  99.1 F (37.3 C)  TempSrc:  Axillary  Oral  SpO2:   93%   Weight: 70.3 kg     Height: 5\' 10"  (1.778 m)        Vitals:   10/15/20 1011 10/15/20 1012 10/15/20 1200 10/15/20  1253  BP:   (!) 113/57 (!) 121/53  Pulse:   97 85  Resp:   18 (!) 24  Temp:  (!) 101.1 F (38.4 C)  99.1 F (37.3 C)  TempSrc:  Axillary  Oral  SpO2:   93%   Weight: 70.3 kg     Height: 5\' 10"  (1.778 m)       Constitutional: NAD, alert and oriented x 3.  Acutely ill-appearing Eyes: PERRL, lids and conjunctivae normal ENMT: Mucous membranes are moist.  Neck: normal, supple, no masses, no thyromegaly Respiratory: Scattered rhonchi over the both lung fields (Rt > Lt), no wheezing, no crackles. Normal respiratory effort. No accessory muscle use.  Cardiovascular: Regular rate and rhythm, no murmurs / rubs / gallops. No extremity edema. 2+ pedal pulses. No carotid bruits.  Abdomen: no tenderness, no masses palpated. No hepatosplenomegaly. Bowel sounds positive.  Musculoskeletal: no clubbing / cyanosis. No joint deformity upper and lower extremities.  Skin: no rashes, lesions, ulcers.  Neurologic: No gross focal neurologic deficit. Psychiatric: Normal mood and affect.   Labs on Admission: I have personally reviewed following labs and imaging studies  CBC: Recent Labs  Lab 10/15/20 1041  WBC 10.1  NEUTROABS  8.8*  HGB 10.2*  HCT 32.0*  MCV 79.8*  PLT 536   Basic Metabolic Panel: Recent Labs  Lab 10/15/20 1041  NA 134*  K 4.2  CL 97*  CO2 25  GLUCOSE 124*  BUN 14  CREATININE 1.35*  CALCIUM 8.5*   GFR: Estimated Creatinine Clearance: 55.7 mL/min (A) (by C-G formula based on SCr of 1.35 mg/dL (H)). Liver Function Tests: Recent Labs  Lab 10/15/20 1041  AST 22  ALT 16  ALKPHOS 79  BILITOT 1.2  PROT 7.2  ALBUMIN 2.6*   No results for input(s): LIPASE, AMYLASE in the last 168 hours. No results for input(s): AMMONIA in the last 168 hours. Coagulation Profile: Recent Labs  Lab 10/15/20 1041  INR 1.3*   Cardiac Enzymes: No results for input(s): CKTOTAL, CKMB, CKMBINDEX, TROPONINI in the last 168 hours. BNP (last 3 results) No results for input(s): PROBNP in the last 8760 hours. HbA1C: No results for input(s): HGBA1C in the last 72 hours. CBG: No results for input(s): GLUCAP in the last 168 hours. Lipid Profile: No results for input(s): CHOL, HDL, LDLCALC, TRIG, CHOLHDL, LDLDIRECT in the last 72 hours. Thyroid Function Tests: No results for input(s): TSH, T4TOTAL, FREET4, T3FREE, THYROIDAB in the last 72 hours. Anemia Panel: No results for input(s): VITAMINB12, FOLATE, FERRITIN, TIBC, IRON, RETICCTPCT in the last 72 hours. Urine analysis: No results found for: COLORURINE, APPEARANCEUR, LABSPEC, PHURINE, GLUCOSEU, HGBUR, BILIRUBINUR, KETONESUR, PROTEINUR, UROBILINOGEN, NITRITE, LEUKOCYTESUR  Radiological Exams on Admission: CT Angio Chest PE W and/or Wo Contrast  Result Date: 10/15/2020 CLINICAL DATA:  Suspect pulmonary embolus. High probability. Cough and hemoptysis. Diagnosed with Covid on 09/22/2020. Cough. EXAM: CT ANGIOGRAPHY CHEST WITH CONTRAST TECHNIQUE: Multidetector CT imaging of the chest was performed using the standard protocol during bolus administration of intravenous contrast. Multiplanar CT image reconstructions and MIPs were obtained to evaluate the  vascular anatomy. CONTRAST:  132mL OMNIPAQUE IOHEXOL 350 MG/ML SOLN COMPARISON:  09/27/2020 FINDINGS: Cardiovascular: Heart size is normal. Coronary stents and coronary artery calcifications are present. There is minimal atherosclerotic calcification of the thoracic aorta, not associated with aneurysm. The pulmonary arteries are moderately well opacified by contrast bolus. There is no acute pulmonary embolus. Mediastinum/Nodes: The visualized portion of the thyroid gland has a normal appearance. Mildly prominent  mediastinal lymph nodes are present and are likely reactive, similar in appearance to the previous exam. Small bilateral hilar lymph nodes are present, less than 1 centimeter in short axis. The esophagus is unremarkable. No axillary adenopathy. Lungs/Pleura: There are focal, discrete areas of consolidation throughout the lungs with a peripheral distribution. The previously seen primarily ground-glass opacities have progressed to more consolidative appearance. This is most remarkable in the RIGHT LOWER lobe where an area of patchy airspace filling opacity now appears as consolidation with air bronchograms. No pleural effusions. Large airways are patent. Upper Abdomen: No acute abnormality. Musculoskeletal: Mild degenerative changes in the midthoracic spine. Review of the MIP images confirms the above findings. IMPRESSION: 1. Technically adequate exam showing no acute pulmonary embolus. 2. Interval progression of bilateral pulmonary opacities, consistent with infectious/inflammatory process. 3. Coronary artery disease. 4. Aortic Atherosclerosis (ICD10-I70.0). Electronically Signed   By: Nolon Nations M.D.   On: 10/15/2020 12:18    EKG: Independently reviewed.   Assessment/Plan Principal Problem:   HCAP (healthcare-associated pneumonia) Active Problems:   Chronic systolic heart failure (HCC)   Coronary artery disease   GERD (gastroesophageal reflux disease)   Type 2 diabetes mellitus without  complication, without long-term current use of insulin ([place])   Depression    Healthcare associated pneumonia Patient was recently hospitalized for COVID-19 pneumonia and presents to the ER for evaluation of persistent cough now with hemoptysis and grayish colored phlegm CT angiogram was negative for PE but shows evidence of lobar pneumonia We will place patient empirically on Rocephin and Zithromax Follow-up results of sputum culture Consult pulmonology    CAD Patient has a history of CAD and is s/p stent angioplasty Hold aspirin andPlavix due to hemoptysis Continue statins, nitrates and metoprolol     Chronic systolic heart failure Secondary to ischemic cardiomyopathy Last known LVEFis about 50%. Continue furosemide, Cozaar and metoprolol    Diabetes mellitus  Maintain consistent carbohydrate diet Sliding scale coverage with insulin   Depression Continue trazodone and Celexa   GERD Continue PPI     DVT prophylaxis: SCD Code Status: Full code Family Communication: Greater than 50% of time was spent discussing patient's condition and plan of care with him and his wife at the bedside.  All questions and concerns have been addressed.  They verbalized understanding and agreed with the plan. Disposition Plan: Back to previous home environment Consults called: Pulmonology     Wister Hoefle MD [facility]     10/15/2020, 1:14 PM

## 2020-10-15 NOTE — ED Notes (Signed)
Advised nurse that patient has assigned bed 

## 2020-10-15 NOTE — ED Notes (Signed)
Pt transported to room 241.

## 2020-10-15 NOTE — Progress Notes (Signed)
Infection Prevention contacted regarding patient isolation status.

## 2020-10-15 NOTE — ED Notes (Signed)
Pt to ED via POV c/o cough and vomiting blood. Pt is in NAD.

## 2020-10-15 NOTE — ED Triage Notes (Signed)
Pt comes POV with cough and hemoptysis since being dx with covid 10/08. Pt actively coughing now.

## 2020-10-15 NOTE — Progress Notes (Signed)
ED note on 10.31.2021 indicates temp 101.1 degrees F, cough, hemoptysis, pneumonia, CXR = opacities progressed to more consolidation, previous + Covid test. I, (Infection Prevention) recommend patient remain on Covid precautions as noted in the Infections and Precautions tab.  Spoke with Marcene Brawn, RN -Camera operator and shared this recommendation at 8:35pm.

## 2020-10-15 NOTE — Progress Notes (Signed)
Report given to Charlett Nose, [place]. All outstanding questions resolved.

## 2020-10-15 NOTE — ED Provider Notes (Signed)
[facility] Emergency Department Provider Note  Time seen: 10:25 AM  I have reviewed the triage vital signs and the nursing notes.   HISTORY  Chief Complaint Cough and Hemoptysis   HPI Steven Vang is a 64 y.o. male with a past medical history of CAD, CHF, gastric reflux, diabetes, presents to the emergency department for shortness of breath.  According to the patient he was diagnosed with Covid 09/22/2020.  Patient states he was discharged from the hospital approximately 2 weeks ago.  Patient states he has continued to cough, continues to feel short of breath especially with any type of exertion.  Subjective fevers.  Patient states at times he is spitting out sputum with specks of blood.  Patient states due to not feeling well lack of improvement in shortness of breath he came to the emergency department this morning.  Denies any chest pain besides when coughing.  States upper abdominal pain when coughing only.   Past Medical History:  Diagnosis Date  . Abnormal EKG   . Anxiety disorder   . CAD (coronary artery disease)   . CHF (congestive heart failure) ([place])   . Chronic systolic heart failure ([place])   . Depression   . Diabetes mellitus ([place])   . GERD (gastroesophageal reflux disease)   . Hypertension   . Lipoma of head 03/31/2020  . Mixed hyperlipidemia   . Non morbid obesity due to excess calories   . Type 2 diabetes mellitus without complication, without long-term current use of insulin [facility])     Patient Active Problem List   Diagnosis Date Noted  . Pneumonia due to COVID-19 virus 10/01/2020  . Obesity (BMI 30-39.9) 10/01/2020  . Acute respiratory failure due to COVID-19 ([place]) 10/01/2020  . AKI (acute kidney injury) ([place]) 09/27/2020  . History of tobacco use 06/23/2020  . Cough 06/23/2020  . Unstable angina ([place]) 05/19/2020  . Lipoma of head 03/31/2020  . Mild episode of recurrent major depressive disorder ([place]) 01/05/2016  . Chronic systolic  heart failure ([place]) 01/13/2015  . Coronary artery disease 01/13/2015  . Essential hypertension 01/13/2015  . Left ventricular dysfunction 01/13/2015  . Type 2 diabetes mellitus without complication, without long-term current use of insulin ([place]) 01/13/2015  . Anxiety disorder 12/20/2014  . GERD (gastroesophageal reflux disease) 12/20/2014  . Mixed hyperlipidemia 12/20/2014  . Non morbid obesity due to excess calories 12/20/2014  . Depression 12/20/2014    Past Surgical History:  Procedure Laterality Date  . ANKLE FRACTURE SURGERY Right   . CARDIAC CATHETERIZATION    . CHOLECYSTECTOMY    . CORONARY STENT PLACEMENT  2015   3 stents  . HAND SURGERY Right   . LEFT HEART CATH AND CORONARY ANGIOGRAPHY Left 06/02/2020   Procedure: LEFT HEART CATH AND CORONARY ANGIOGRAPHY;  Surgeon: Dionisio David, MD;  Location: [facility];  Service: Cardiovascular;  Laterality: Left;    Prior to Admission medications   Medication Sig Start Date End Date Taking? Authorizing Provider  aspirin 81 MG EC tablet Take 1 tablet by mouth daily.    [provider]  benzonatate (TESSALON) 100 MG capsule Take 1 capsule (100 mg total) by mouth 3 (three) times daily. 10/05/20   Ezekiel Slocumb, DO  citalopram (CELEXA) 40 MG tablet Take 1 tablet (40 mg total) by mouth daily. 06/23/20   Eulogio Bear, NP  clopidogrel (PLAVIX) 75 MG tablet Take 1 tablet (75 mg total) by mouth daily. Patient taking differently: Take 75  mg by mouth at bedtime.  06/23/20   Eulogio Bear, NP  famotidine (PEPCID) 20 MG tablet Take 1 tablet (20 mg total) by mouth 2 (two) times daily. 09/25/20   Eulogio Bear, NP  fluticasone (FLONASE) 50 MCG/ACT nasal spray Place 2 sprays into both nostrils daily. 06/23/20   Eulogio Bear, NP  furosemide (LASIX) 40 MG tablet Take 40 mg by mouth daily. 01/14/20   [provider]  gabapentin (NEURONTIN) 300 MG capsule Take 1-2 capsules (300-600 mg total) by mouth at  bedtime as needed. Patient taking differently: Take 600 mg by mouth at bedtime as needed.  06/23/20   Eulogio Bear, NP  guaiFENesin-dextromethorphan (ROBITUSSIN DM) 100-10 MG/5ML syrup Take 10 mLs by mouth every 4 (four) hours as needed for cough. 10/05/20   Nicole Kindred A, DO  insulin aspart (NOVOLOG) 100 UNIT/ML FlexPen Inject 5 Units into the skin 3 (three) times daily with meals. Add additional units per sliding scale provided. 10/05/20   Nicole Kindred A, DO  insulin detemir (LEVEMIR) 100 UNIT/ML FlexPen Inject 18 Units into the skin 2 (two) times daily. 10/05/20   Ezekiel Slocumb, DO  isosorbide mononitrate (IMDUR) 30 MG 24 hr tablet Take 30 mg by mouth at bedtime.  06/16/20 10/01/20  [provider]  metoprolol succinate (TOPROL-XL) 50 MG 24 hr tablet Take 50 mg by mouth at bedtime.  06/08/20   [provider]  pantoprazole (PROTONIX) 40 MG tablet Take 1 tablet (40 mg total) by mouth daily. 09/25/20   Eulogio Bear, NP  pravastatin (PRAVACHOL) 40 MG tablet Take 0.5 tablets (20 mg total) by mouth every evening. 06/23/20   Eulogio Bear, NP  traZODone (DESYREL) 50 MG tablet Take 1 tablet (50 mg total) by mouth at bedtime. PRN Patient taking differently: Take 50 mg by mouth at bedtime.  06/23/20   Eulogio Bear, NP    No Known Allergies  Family History  Problem Relation Age of Onset  . CAD Mother   . Diabetes Mother   . Colon cancer Father   . Breast cancer Paternal Grandmother   . Stomach cancer Paternal Grandfather     Social History Social History   Tobacco Use  . Smoking status: Former Smoker    Types: Cigarettes    Quit date: 1990    Years since quitting: 31.8  . Smokeless tobacco: Never Used  Vaping Use  . Vaping Use: Never used  Substance Use Topics  . Alcohol use: Not Currently  . Drug use: Never    Review of Systems Constitutional: Found to be febrile to 101.1 in the emergency department.  Subjective fever at home. ENT:  Negative for recent illness/congestion Cardiovascular: Chest pain when coughing Respiratory: Positive for shortness of breath.  Frequent cough occasional hemoptysis. Gastrointestinal: Negative for abdominal pain, vomiting Musculoskeletal: Negative for musculoskeletal complaints Neurological: Negative for headache All other ROS negative  ____________________________________________   PHYSICAL EXAM:  VITAL SIGNS: ED Triage Vitals  Enc Vitals Group     BP 10/15/20 1010 112/64     Pulse Rate 10/15/20 1010 60     Resp 10/15/20 1010 20     Temp 10/15/20 1012 (!) 101.1 F (38.4 C)     Temp Source 10/15/20 1012 Axillary     SpO2 10/15/20 1010 95 %     Weight 10/15/20 1011 155 lb (70.3 kg)     Height 10/15/20 1011 5\' 10"  (1.778 m)     Head Circumference --  Peak Flow --      Pain Score 10/15/20 1010 8     Pain Loc --      Pain Edu? --      Excl. in [place]? --    Constitutional: Alert and oriented. Well appearing and in no distress. Eyes: Normal exam ENT      Head: Normocephalic and atraumatic.      Mouth/Throat: Mucous membranes are moist. Cardiovascular: Normal rate, regular rhythm.  Respiratory: Patient has mild tachypnea very frequent cough.  No obvious wheeze rales or rhonchi. Gastrointestinal: Soft and nontender. No distention.   Musculoskeletal: Nontender with normal range of motion in all extremities. No lower extremity tenderness Neurologic:  Normal speech and language. No gross focal neurologic deficits  Skin:  Skin is warm, dry and intact.  Psychiatric: Mood and affect are normal.   ____________________________________________   RADIOLOGY  CTA of the chest shows no PE but does show bilateral opacities which have progressed and worsened.  ____________________________________________   INITIAL IMPRESSION / ASSESSMENT AND PLAN / ED COURSE  Pertinent labs & imaging results that were available during my care of the patient were reviewed by me and considered in my  medical decision making (see chart for details).   Patient presents emergency department for dyspnea hemoptysis weakness found to be febrile recent Covid infection.  We will check labs including blood cultures and lactic acid.  We will start treatment with steroids.  We will proceed with CTA imaging of the chest to rule out PE and to evaluate for superinfection.  Patient has borderline hypoxic between 90 and 95% and occasionally desatted into the 80s on room air.  We will cover for HCAP given the intermittent hypoxia worsening CT imaging.  Patient will be admitted to the hospital service.  Zalan Shidler Carbine was evaluated in Emergency Department on 10/15/2020 for the symptoms described in the history of present illness. He was evaluated in the context of the global COVID-19 pandemic, which necessitated consideration that the patient might be at risk for infection with the SARS-CoV-2 virus that causes COVID-19. Institutional protocols and algorithms that pertain to the evaluation of patients at risk for COVID-19 are in a state of rapid change based on information released by regulatory bodies including the CDC and federal and state organizations. These policies and algorithms were followed during the patient's care in the ED.  ____________________________________________   FINAL CLINICAL IMPRESSION(S) / ED DIAGNOSES  Dyspnea Hypoxia COVID-19 Pneumonia   Harvest Dark, MD 10/15/20 1504

## 2020-10-16 ENCOUNTER — Telehealth: Payer: 59 | Admitting: Nurse Practitioner

## 2020-10-16 DIAGNOSIS — I959 Hypotension, unspecified: Secondary | ICD-10-CM

## 2020-10-16 DIAGNOSIS — K219 Gastro-esophageal reflux disease without esophagitis: Secondary | ICD-10-CM

## 2020-10-16 LAB — RESPIRATORY PANEL BY PCR

## 2020-10-16 LAB — EXPECTORATED SPUTUM ASSESSMENT W GRAM STAIN, RFLX TO RESP C

## 2020-10-16 LAB — FERRITIN: Ferritin: 914 ng/mL — ABNORMAL HIGH (ref 24–336)

## 2020-10-16 LAB — URINE CULTURE: Culture: NO GROWTH

## 2020-10-16 LAB — CBC
HCT: 30.1 % — ABNORMAL LOW (ref 39.0–52.0)
Hemoglobin: 9.6 g/dL — ABNORMAL LOW (ref 13.0–17.0)
MCH: 25.3 pg — ABNORMAL LOW (ref 26.0–34.0)
MCHC: 31.9 g/dL (ref 30.0–36.0)
MCV: 79.4 fL — ABNORMAL LOW (ref 80.0–100.0)
Platelets: 271 10*3/uL (ref 150–400)
RBC: 3.79 MIL/uL — ABNORMAL LOW (ref 4.22–5.81)
RDW: 13.2 % (ref 11.5–15.5)
WBC: 9.3 10*3/uL (ref 4.0–10.5)
nRBC: 0 % (ref 0.0–0.2)

## 2020-10-16 LAB — BASIC METABOLIC PANEL
Anion gap: 13 (ref 5–15)
BUN: 25 mg/dL — ABNORMAL HIGH (ref 8–23)
CO2: 22 mmol/L (ref 22–32)
Calcium: 8.4 mg/dL — ABNORMAL LOW (ref 8.9–10.3)
Chloride: 100 mmol/L (ref 98–111)
Creatinine, Ser: 1.34 mg/dL — ABNORMAL HIGH (ref 0.61–1.24)
GFR, Estimated: 60 mL/min — ABNORMAL LOW (ref >60)
Glucose, Bld: 233 mg/dL — ABNORMAL HIGH (ref 70–99)
Potassium: 4.2 mmol/L (ref 3.5–5.1)
Sodium: 135 mmol/L (ref 135–145)

## 2020-10-16 LAB — GLUCOSE, CAPILLARY
Glucose-Capillary: 215 mg/dL — ABNORMAL HIGH (ref 70–99)
Glucose-Capillary: 218 mg/dL — ABNORMAL HIGH (ref 70–99)
Glucose-Capillary: 223 mg/dL — ABNORMAL HIGH (ref 70–99)
Glucose-Capillary: 226 mg/dL — ABNORMAL HIGH (ref 70–99)
Glucose-Capillary: 239 mg/dL — ABNORMAL HIGH (ref 70–99)
Glucose-Capillary: 256 mg/dL — ABNORMAL HIGH (ref 70–99)

## 2020-10-16 LAB — PROCALCITONIN: Procalcitonin: 0.37 ng/mL

## 2020-10-16 LAB — SEDIMENTATION RATE: Sed Rate: >140 mm/hr — ABNORMAL HIGH (ref 0–20)

## 2020-10-16 LAB — STREP PNEUMONIAE URINARY ANTIGEN: Strep Pneumo Urinary Antigen: NEGATIVE

## 2020-10-16 MED ORDER — INSULIN DETEMIR 100 UNIT/ML ~~LOC~~ SOLN
10.0000 [IU] | Freq: Every day | SUBCUTANEOUS | Status: DC
  Administered 2020-10-16 – 2020-10-18 (×3): 10 [IU] via SUBCUTANEOUS
  Filled 2020-10-16 (×4): qty 0.1

## 2020-10-16 MED ORDER — LOPERAMIDE HCL 2 MG PO CAPS
2.0000 mg | ORAL_CAPSULE | Freq: Four times a day (QID) | ORAL | Status: DC | PRN
  Administered 2020-10-16: 2 mg via ORAL
  Filled 2020-10-16: qty 1

## 2020-10-16 MED ORDER — HYDROCODONE-HOMATROPINE 5-1.5 MG/5ML PO SYRP
5.0000 mL | ORAL_SOLUTION | Freq: Three times a day (TID) | ORAL | Status: DC
  Administered 2020-10-16 – 2020-10-19 (×9): 5 mL via ORAL
  Filled 2020-10-16 (×9): qty 5

## 2020-10-16 MED ORDER — LINAGLIPTIN 5 MG PO TABS
5.0000 mg | ORAL_TABLET | Freq: Every day | ORAL | Status: DC
  Administered 2020-10-16 – 2020-10-19 (×4): 5 mg via ORAL
  Filled 2020-10-16 (×4): qty 1

## 2020-10-16 NOTE — Progress Notes (Signed)
Pt arrived to unit in stable condition.

## 2020-10-16 NOTE — Progress Notes (Signed)
1        [facility] NAME: Steven Vang    MR#:  [id number]  DATE OF BIRTH:  12/07/1956  SUBJECTIVE:  CHIEF COMPLAINT:   Chief Complaint  Patient presents with  . Cough  . Hemoptysis  Cough +, had some blood-streaked expectoration overnight on clearing throat REVIEW OF SYSTEMS:  Review of Systems  Constitutional: Negative for diaphoresis, fever, malaise/fatigue and weight loss.  HENT: Negative for ear discharge, ear pain, hearing loss, nosebleeds, sore throat and tinnitus.   Eyes: Negative for blurred vision and pain.  Respiratory: Positive for cough and hemoptysis. Negative for shortness of breath and wheezing.   Cardiovascular: Negative for chest pain, palpitations, orthopnea and leg swelling.  Gastrointestinal: Negative for abdominal pain, blood in stool, constipation, diarrhea, heartburn, nausea and vomiting.  Genitourinary: Negative for dysuria, frequency and urgency.  Musculoskeletal: Negative for back pain and myalgias.  Skin: Negative for itching and rash.  Neurological: Negative for dizziness, tingling, tremors, focal weakness, seizures, weakness and headaches.  Psychiatric/Behavioral: Negative for depression. The patient is not nervous/anxious.    DRUG ALLERGIES:  No Known Allergies VITALS:  Blood pressure (!) 100/59, pulse 64, temperature 97.6 F (36.4 C), temperature source Oral, resp. rate 16, height 5\' 10"  (1.778 m), weight 113.2 kg, SpO2 96 %. PHYSICAL EXAMINATION:  Physical Exam HENT:     Head: Normocephalic and atraumatic.  Eyes:     Conjunctiva/sclera: Conjunctivae normal.     Pupils: Pupils are equal, round, and reactive to light.  Neck:     Thyroid: No thyromegaly.     Trachea: No tracheal deviation.  Cardiovascular:     Rate and Rhythm: Normal rate and regular rhythm.     Heart sounds: Normal heart sounds.  Pulmonary:     Effort: Pulmonary effort is normal. No respiratory distress.     Breath sounds: Normal breath  sounds. No wheezing.  Chest:     Chest wall: No tenderness.  Abdominal:     General: Bowel sounds are normal. There is no distension.     Palpations: Abdomen is soft.     Tenderness: There is no abdominal tenderness.  Musculoskeletal:        General: Normal range of motion.     Cervical back: Normal range of motion and neck supple.  Skin:    General: Skin is warm and dry.     Findings: No rash.  Neurological:     Mental Status: He is alert and oriented to person, place, and time.     Cranial Nerves: No cranial nerve deficit.    LABORATORY PANEL:  Male CBC Recent Labs  Lab 10/16/20 0423  WBC 9.3  HGB 9.6*  HCT 30.1*  PLT 271   ------------------------------------------------------------------------------------------------------------------ Chemistries  Recent Labs  Lab 10/15/20 1041 10/15/20 1041 10/16/20 0423  NA 134*   < > 135  K 4.2   < > 4.2  CL 97*   < > 100  CO2 25   < > 22  GLUCOSE 124*   < > 233*  BUN 14   < > 25*  CREATININE 1.35*   < > 1.34*  CALCIUM 8.5*   < > 8.4*  AST 22  --   --   ALT 16  --   --   ALKPHOS 79  --   --   BILITOT 1.2  --   --    < > = values in this interval not displayed.  RADIOLOGY:  No results found. ASSESSMENT AND PLAN:  64 year old male with a known history of coronary disease, diabetes mellitus, hypertension, obesity, depression, anxiety is admitted for pneumonia  Community-acquired pneumonia Patient was recently hospitalized for COVID-19 pneumonia Now having hemoptysis and persistent cough CT chest showed lobar pneumonia Continue Rocephin and Zithromax Continue Robitussin and Tessalon Perles Add Hycodan Appreciate pulmonary input - checking Fungitell Procalcitonin trend for antimicrobial guidance Urine Legionella and strep pneumo check Respiratory viral panel check May need bronchoscopy  Hypotension Likely due to infection.  Hold Imdur and metoprolol  Coronary artery disease status post stenting Holding  aspirin and Plavix due to hemoptysis Continue statin  Chronic systolic CHF Well compensated at this time  Diabetes mellitus Add Tradjenta 5 mg p.o. daily and insulin Levemir 10 units subcu daily Continue sliding scale  Depression Continue trazodone and Celexa  GERD PPI  Obesity Body mass index is 35.81 kg/m.      Status is: Inpatient  Remains inpatient appropriate because:Ongoing diagnostic testing needed not appropriate for outpatient work up   Dispo: The patient is from: Home              Anticipated d/c is to: Home              Anticipated d/c date is: 3 days              Patient currently is not medically stable to d/c.  Waiting for other lab work including strep pneumo, urine Legionella, respiratory panel and Fungitell.  May need bronchoscopy   Can transfer him off to any MedSurg    DVT prophylaxis:       SCDs Start: 10/15/20 1253     Family Communication: Updated patient's wife Silva Bandy at [phone]  All the records are reviewed and case discussed with Care Management/Social Worker. Management plans discussed with the patient, family and they are in agreement.  CODE STATUS: Full Code  TOTAL TIME TAKING CARE OF THIS PATIENT: 35 minutes.   More than 50% of the time was spent in counseling/coordination of care: YES  POSSIBLE D/C IN 2-3 DAYS, DEPENDING ON CLINICAL CONDITION.   Max Sane M.D on 10/16/2020 at 1:35 PM  [facility]   CC: Primary care physician; Eulogio Bear, NP  Note: This dictation was prepared with Dragon dictation along with smaller phrase technology. Any transcriptional errors that result from this process are unintentional.

## 2020-10-16 NOTE — Progress Notes (Signed)
Inpatient Diabetes Program Recommendations  AACE/ADA: New Consensus Statement on Inpatient Glycemic Control (2015)  Target Ranges:  Prepandial:   less than 140 mg/dL      Peak postprandial:   less than 180 mg/dL (1-2 hours)      Critically ill patients:  140 - 180 mg/dL   Lab Results  Component Value Date   GLUCAP 239 (H) 10/16/2020   HGBA1C 7.7 (H) 10/03/2020    Review of Glycemic Control  Diabetes history: DM 2, 7.7% on 10/03/20, d/c'd on insulin last admission also on steroids Outpatient Diabetes medications: Novolog 5 units tid, Levemir 18 units bid Current orders for Inpatient glycemic control:  Novolog 0-15 units tid Solumedrol 125 mg x1 dose 10/31 am  Inpatient Diabetes Program Recommendations:    - Consider Tradjenta 5 mg Daily - Consider Levemir 10 units  Thanks, Tama Headings RN, MSN, BC-ADM Inpatient Diabetes Coordinator Team Pager [phone] (8a-5p)

## 2020-10-16 NOTE — Progress Notes (Signed)
Per conversation with Cyd Silence, [facility], [place], it is ok to d/c COVID isolation and patient will be allowed to have visitors.

## 2020-10-16 NOTE — Consult Note (Signed)
Pulmonary Medicine          Date: 10/16/2020,   MRN# [id number] Kolden Dupee Malmquist 1956-04-22     AdmissionWeight: 70.3 kg                 CurrentWeight: 113.2 kg   Referring physician: Dr. Francine Graven   CHIEF COMPLAINT:   Acute on chronic hypoxemic respiratory failure with nonmassive hemoptysis   HISTORY OF PRESENT ILLNESS   64 year old pleasant male with a history as below in [place] section including significant history of CHF with reduced ejection fraction, type 2 diabetes, CAD status post remote COVID-19 pneumonia with acute hypoxemic respiratory failure requiring hospitalization and received IV remdesivir therapy as well as systemic glucocorticoid therapy with Decadron while hospitalized.  Patient was initially positive for COVID-19 September 22, 2020.  He came in due to cough with hemoptysis..  In the ER he was found to be febrile tachypneic tachycardic.  He had CT chest done with PE protocol which was negative for acute venous thromboembolism however did show right lower lobe consolidated pneumonia as well as patchy consolidations with interval progression from previously noted groundglass opacification in similar bilateral midlung zones as well as right lower lobe on October 13 study.  Blood work was done in the ED which was unrevealing except for mild elevation in creatinine.  Pulmonary consultation placed for acute on chronic hypoxemic respiratory failure with pneumonia with hemoptysis. Overnight patient reports continued blood streaked expectoration but only with clearing throat or cough.    PAST MEDICAL HISTORY   Past Medical History:  Diagnosis Date  . Abnormal EKG   . Anxiety disorder   . CAD (coronary artery disease)   . CHF (congestive heart failure) ([place])   . Chronic systolic heart failure ([place])   . Depression   . Diabetes mellitus ([place])   . GERD (gastroesophageal reflux disease)   . Hypertension   . Lipoma of head 03/31/2020  . Mixed hyperlipidemia   . Non morbid  obesity due to excess calories   . Type 2 diabetes mellitus without complication, without long-term current use of insulin ([place])      SURGICAL HISTORY   Past Surgical History:  Procedure Laterality Date  . ANKLE FRACTURE SURGERY Right   . CARDIAC CATHETERIZATION    . CHOLECYSTECTOMY    . CORONARY STENT PLACEMENT  2015   3 stents  . HAND SURGERY Right   . LEFT HEART CATH AND CORONARY ANGIOGRAPHY Left 06/02/2020   Procedure: LEFT HEART CATH AND CORONARY ANGIOGRAPHY;  Surgeon: Dionisio David, MD;  Location: [facility];  Service: Cardiovascular;  Laterality: Left;     FAMILY HISTORY   Family History  Problem Relation Age of Onset  . CAD Mother   . Diabetes Mother   . Colon cancer Father   . Breast cancer Paternal Grandmother   . Stomach cancer Paternal Grandfather      SOCIAL HISTORY   Social History   Tobacco Use  . Smoking status: Former Smoker    Types: Cigarettes    Quit date: 1990    Years since quitting: 31.8  . Smokeless tobacco: Never Used  Vaping Use  . Vaping Use: Never used  Substance Use Topics  . Alcohol use: Not Currently  . Drug use: Never     MEDICATIONS    Home Medication:    Current Medication:  Current Facility-Administered Medications:  .  azithromycin (ZITHROMAX) 500 mg in sodium chloride 0.9 % 250 mL IVPB,  500 mg, Intravenous, Q24H, Agbata, Tochukwu, MD, Stopped at 10/15/20 1819 .  benzonatate (TESSALON) capsule 100 mg, 100 mg, Oral, TID, Agbata, Tochukwu, MD, 100 mg at 10/15/20 2123 .  cefTRIAXone (ROCEPHIN) 2 g in sodium chloride 0.9 % 100 mL IVPB, 2 g, Intravenous, Q24H, Agbata, Tochukwu, MD, Stopped at 10/15/20 1652 .  citalopram (CELEXA) tablet 40 mg, 40 mg, Oral, Daily, Agbata, Tochukwu, MD .  fluticasone (FLONASE) 50 MCG/ACT nasal spray 2 spray, 2 spray, Each Nare, Daily, Agbata, Tochukwu, MD .  furosemide (LASIX) tablet 40 mg, 40 mg, Oral, Daily, Agbata, Tochukwu, MD, 40 mg at 10/15/20 1709 .  gabapentin (NEURONTIN)  capsule 600 mg, 600 mg, Oral, QHS, Agbata, Tochukwu, MD, 600 mg at 10/15/20 2123 .  guaiFENesin (ROBITUSSIN) 100 MG/5ML solution 100 mg, 5 mL, Oral, Q4H PRN, Agbata, Tochukwu, MD, 100 mg at 10/16/20 0352 .  insulin aspart (novoLOG) injection 0-15 Units, 0-15 Units, Subcutaneous, TID WC, Agbata, Tochukwu, MD, 5 Units at 10/15/20 1708 .  isosorbide mononitrate (IMDUR) 24 hr tablet 30 mg, 30 mg, Oral, QHS, Agbata, Tochukwu, MD, 30 mg at 10/15/20 2123 .  metoprolol succinate (TOPROL-XL) 24 hr tablet 50 mg, 50 mg, Oral, QHS, Agbata, Tochukwu, MD, 50 mg at 10/15/20 2123 .  pantoprazole (PROTONIX) EC tablet 40 mg, 40 mg, Oral, Daily, Agbata, Tochukwu, MD .  pravastatin (PRAVACHOL) tablet 20 mg, 20 mg, Oral, QPM, Agbata, Tochukwu, MD, 20 mg at 10/15/20 1835 .  traZODone (DESYREL) tablet 50 mg, 50 mg, Oral, QHS PRN, Agbata, Tochukwu, MD, 50 mg at 10/15/20 2130    ALLERGIES   Patient has no known allergies.     REVIEW OF SYSTEMS    Review of Systems:  Gen:  Denies  fever, sweats, chills weigh loss  HEENT: Denies blurred vision, double vision, ear pain, eye pain, hearing loss, nose bleeds, sore throat Cardiac:  No dizziness, chest pain or heaviness, chest tightness,edema Resp:   Denies cough or sputum porduction, shortness of breath,wheezing, hemoptysis,  Gi: Denies swallowing difficulty, stomach pain, nausea or vomiting, diarrhea, constipation, bowel incontinence Gu:  Denies bladder incontinence, burning urine Ext:   Denies Joint pain, stiffness or swelling Skin: Denies  skin rash, easy bruising or bleeding or hives Endoc:  Denies polyuria, polydipsia , polyphagia or weight change Psych:   Denies depression, insomnia or hallucinations   Other:  All other systems negative   VS: BP (!) 98/50   Pulse 69   Temp 97.9 F (36.6 C) (Oral)   Resp 20   Ht _0  (1.778 m)   Wt 113.2 kg   SpO2 91%   BMI 35.81 kg/m      PHYSICAL EXAM    GENERAL:NAD, no fevers, chills, no weakness no  fatigue HEAD: Normocephalic, atraumatic.  EYES: Pupils equal, round, reactive to light. Extraocular muscles intact. No scleral icterus.  MOUTH: Moist mucosal membrane. Dentition intact. No abscess noted.  EAR, NOSE, THROAT: Clear without exudates. No external lesions.  NECK: Supple. No thyromegaly. No nodules. No JVD.  PULMONARY: Diffuse coarse rhonchi right sided +wheezes CARDIOVASCULAR: S1 and S2. Regular rate and rhythm. No murmurs, rubs, or gallops. No edema. Pedal pulses 2+ bilaterally.  GASTROINTESTINAL: Soft, nontender, nondistended. No masses. Positive bowel sounds. No hepatosplenomegaly.  MUSCULOSKELETAL: No swelling, clubbing, or edema. Range of motion full in all extremities.  NEUROLOGIC: Cranial nerves II through XII are intact. No gross focal neurological deficits. Sensation intact. Reflexes intact.  SKIN: No ulceration, lesions, rashes, or cyanosis. Skin warm and dry. Turgor intact.  PSYCHIATRIC: Mood, affect within normal limits. The patient is awake, alert and oriented x 3. Insight, judgment intact.       IMAGING       CT Angio Chest PE W and/or Wo Contrast  Result Date: 10/15/2020 CLINICAL DATA:  Suspect pulmonary embolus. High probability. Cough and hemoptysis. Diagnosed with Covid on 09/22/2020. Cough. EXAM: CT ANGIOGRAPHY CHEST WITH CONTRAST TECHNIQUE: Multidetector CT imaging of the chest was performed using the standard protocol during bolus administration of intravenous contrast. Multiplanar CT image reconstructions and MIPs were obtained to evaluate the vascular anatomy. CONTRAST:  119m OMNIPAQUE IOHEXOL 350 MG/ML SOLN COMPARISON:  09/27/2020 FINDINGS: Cardiovascular: Heart size is normal. Coronary stents and coronary artery calcifications are present. There is minimal atherosclerotic calcification of the thoracic aorta, not associated with aneurysm. The pulmonary arteries are moderately well opacified by contrast bolus. There is no acute pulmonary embolus.  Mediastinum/Nodes: The visualized portion of the thyroid gland has a normal appearance. Mildly prominent mediastinal lymph nodes are present and are likely reactive, similar in appearance to the previous exam. Small bilateral hilar lymph nodes are present, less than 1 centimeter in short axis. The esophagus is unremarkable. No axillary adenopathy. Lungs/Pleura: There are focal, discrete areas of consolidation throughout the lungs with a peripheral distribution. The previously seen primarily ground-glass opacities have progressed to more consolidative appearance. This is most remarkable in the RIGHT LOWER lobe where an area of patchy airspace filling opacity now appears as consolidation with air bronchograms. No pleural effusions. Large airways are patent. Upper Abdomen: No acute abnormality. Musculoskeletal: Mild degenerative changes in the midthoracic spine. Review of the MIP images confirms the above findings. IMPRESSION: 1. Technically adequate exam showing no acute pulmonary embolus. 2. Interval progression of bilateral pulmonary opacities, consistent with infectious/inflammatory process. 3. Coronary artery disease. 4. Aortic Atherosclerosis (ICD10-I70.0). Electronically Signed   By: ENolon NationsM.D.   On: 10/15/2020 12:18   CT Angio Chest PE W and/or Wo Contrast  Result Date: 09/27/2020 CLINICAL DATA:  Cough, shortness of breath.  COVID-19 positive EXAM: CT ANGIOGRAPHY CHEST WITH CONTRAST TECHNIQUE: Multidetector CT imaging of the chest was performed using the standard protocol during bolus administration of intravenous contrast. Multiplanar CT image reconstructions and MIPs were obtained to evaluate the vascular anatomy. CONTRAST:  74mOMNIPAQUE IOHEXOL 350 MG/ML SOLN COMPARISON:  Same day chest x-ray FINDINGS: Cardiovascular: Satisfactory opacification of the pulmonary arteries to the segmental level. No evidence of pulmonary embolism. Thoracic aorta is nonaneurysmal. Minimal atherosclerotic  calcification of the aortic arch. Coronary artery stent is noted. Normal heart size. No pericardial effusion. Mediastinum/Nodes: Mildly prominent subcarinal and right hilar lymph nodes. No axillary lymphadenopathy. Thyroid, trachea, and esophagus within normal limits. Lungs/Pleura: Patchy posterior right lower lobe airspace consolidation with surrounding ground-glass opacity. Additional multifocal areas of predominantly ground-glass opacity within the peripheral aspects of both lung fields. No pleural effusion. No pneumothorax. There are a few small scattered calcified granulomas within the left upper lobe. Upper Abdomen: No acute abnormality. Musculoskeletal: No chest wall abnormality. No acute or significant osseous findings. Review of the MIP images confirms the above findings. IMPRESSION: 1. No evidence of pulmonary embolism. 2. Patchy posterior right lower lobe airspace consolidation. Additional multifocal areas of predominantly ground-glass opacity within the peripheral aspects of both lung fields. Findings are suspicious for multifocal pneumonia in the setting of COVID-19 infection. 3. Mildly prominent subcarinal and right hilar lymph nodes, likely reactive. Electronically Signed   By: NiDavina Poke.O.   On: 09/27/2020 13:27  DG Chest Portable 1 View  Result Date: 10/01/2020 CLINICAL DATA:  Shortness of.  COVID-19 positive EXAM: PORTABLE CHEST 1 VIEW COMPARISON:  September 29, 2020. FINDINGS: There is patchy opacity in the lung bases, similar to recent study. Lungs elsewhere clear. Heart is upper normal in size with pulmonary vascularity normal. No adenopathy. No bone lesions. IMPRESSION: Bibasilar airspace opacity consistent with multifocal pneumonia. Suspect atypical organism etiology given the history and radiograph appearance. Heart size upper normal. No adenopathy. Electronically Signed   By: Lowella Grip III M.D.   On: 10/01/2020 12:26   DG Chest Port 1 View  Result Date:  09/29/2020 CLINICAL DATA:  Acute hypoxemic respiratory failure due to COVID 19 EXAM: PORTABLE CHEST 1 VIEW COMPARISON:  09/27/2020 FINDINGS: Mild patchy bibasilar airspace disease has progressed in the interval. Upper lobes appear clear. Heart size and vascularity normal. No effusion. IMPRESSION: Progression of patchy bibasilar airspace disease compatible with COVID pneumonia. Electronically Signed   By: Franchot Gallo M.D.   On: 09/29/2020 11:23   DG Chest Port 1 View  Result Date: 09/27/2020 CLINICAL DATA:  Possible sepsis. COVID positive with shortness of breath, body aches and cough. EXAM: PORTABLE CHEST 1 VIEW COMPARISON:  Chest radiograph 07/01/2014 FINDINGS: Low lung volumes, which limits evaluation. No focal consolidation. Cardiac silhouette is within normal limits and mildly accentuated by low lung volumes and portable technique. No visible pleural effusions pneumothorax. No acute osseous abnormality. IMPRESSION: No evidence of acute cardiopulmonary disease on this AP portable radiograph with low lung volumes. Electronically Signed   By: Margaretha Sheffield MD   On: 09/27/2020 11:02      ASSESSMENT/PLAN   Acute on chronic hypoxemic respiratory failure -Due to bilateral pneumonia worse at the right lower lobe possible aspiration of blood during episodes of hemoptysis -Bilateral consolidated infiltrates of the mid lung zones with interval progression from previous study in October 13 as elucidated in pictorial documentation above  -We will rule out fungal pneumonia-Fungitell serum -Procalcitonin trend for antimicrobial guidance -Legionella, strep pneumoniae urinary antigen -Mild elevation lactic acid will continue to trend -Conservative fluid balance due to history of CHF currently with prerenal azotemia and mild AKI -Expectorated sputum respiratory culture -Blood cultures x2 -Respiratory viral panel to exclude secondary viral infection -Caution using prophylactic anticoagulation for  DVT -will consider bronchoscopic evaluation -will DC Lasix due to signs of mild dehydration and hypotension -Ferritin and ESR for background inflammatory changes   Community-acquired pneumonia -Possible secondary bacterial pneumonia post viral COVID-19 pneumonia -Agree with empiric Rocephin and Zithromax status post x1 Maxipime -Agree with gabapentin and Robitussin for cough -will add Hycodan to suppress cough for now -will consider steroids to decrease inflammatory component but currently patient is clinically improving   Acute kidney injury stage I -Likely due to dehydration secondary to acutely ill status  Severe hyperglycemia -Due to stress response -Monitor sugars closely while acutely ill supplement with insulin sliding scale for now     Thank you for allowing me to participate in the care of this patient.   Patient/Family are satisfied with care plan and all questions have been answered.  This document was prepared using Dragon voice recognition software and may include unintentional dictation errors.     Ottie Glazier, M.D.  Division of [place]

## 2020-10-16 NOTE — Progress Notes (Signed)
Patient transferred to [facility].  He will notify his wife of transfer.

## 2020-10-17 DIAGNOSIS — R042 Hemoptysis: Secondary | ICD-10-CM

## 2020-10-17 DIAGNOSIS — E119 Type 2 diabetes mellitus without complications: Secondary | ICD-10-CM

## 2020-10-17 LAB — CBC
HCT: 28 % — ABNORMAL LOW (ref 39.0–52.0)
Hemoglobin: 9.2 g/dL — ABNORMAL LOW (ref 13.0–17.0)
MCH: 26.1 pg (ref 26.0–34.0)
MCHC: 32.9 g/dL (ref 30.0–36.0)
MCV: 79.3 fL — ABNORMAL LOW (ref 80.0–100.0)
Platelets: 224 10*3/uL (ref 150–400)
RBC: 3.53 MIL/uL — ABNORMAL LOW (ref 4.22–5.81)
RDW: 13.5 % (ref 11.5–15.5)
WBC: 9 10*3/uL (ref 4.0–10.5)
nRBC: 0 % (ref 0.0–0.2)

## 2020-10-17 LAB — BASIC METABOLIC PANEL
Anion gap: 7 (ref 5–15)
BUN: 29 mg/dL — ABNORMAL HIGH (ref 8–23)
CO2: 26 mmol/L (ref 22–32)
Calcium: 8.6 mg/dL — ABNORMAL LOW (ref 8.9–10.3)
Chloride: 104 mmol/L (ref 98–111)
Creatinine, Ser: 1.18 mg/dL (ref 0.61–1.24)
GFR, Estimated: >60 mL/min (ref >60)
Glucose, Bld: 125 mg/dL — ABNORMAL HIGH (ref 70–99)
Potassium: 4.1 mmol/L (ref 3.5–5.1)
Sodium: 137 mmol/L (ref 135–145)

## 2020-10-17 LAB — LEGIONELLA PNEUMOPHILA SEROGP 1 UR AG: L. pneumophila Serogp 1 Ur Ag: NEGATIVE

## 2020-10-17 LAB — PROCALCITONIN: Procalcitonin: 0.24 ng/mL

## 2020-10-17 LAB — GLUCOSE, CAPILLARY
Glucose-Capillary: 121 mg/dL — ABNORMAL HIGH (ref 70–99)
Glucose-Capillary: 120 mg/dL — ABNORMAL HIGH (ref 70–99)
Glucose-Capillary: 121 mg/dL — ABNORMAL HIGH (ref 70–99)
Glucose-Capillary: 129 mg/dL — ABNORMAL HIGH (ref 70–99)

## 2020-10-17 MED ORDER — GABAPENTIN 300 MG PO CAPS
600.0000 mg | ORAL_CAPSULE | Freq: Two times a day (BID) | ORAL | Status: DC
  Administered 2020-10-17 – 2020-10-19 (×5): 600 mg via ORAL
  Filled 2020-10-17 (×5): qty 2

## 2020-10-17 NOTE — Progress Notes (Signed)
1        [facility] NAME: Steven Vang    MR#:  [id number]  DATE OF BIRTH:  1956-08-20  SUBJECTIVE:  CHIEF COMPLAINT:   Chief Complaint  Patient presents with  . Cough  . Hemoptysis  Cough and hemoptysis continues.  Does not feel as good as yesterday but cannot describe.  Feels like he did not sleep well last night REVIEW OF SYSTEMS:  Review of Systems  Constitutional: Negative for diaphoresis, fever, malaise/fatigue and weight loss.  HENT: Negative for ear discharge, ear pain, hearing loss, nosebleeds, sore throat and tinnitus.   Eyes: Negative for blurred vision and pain.  Respiratory: Positive for cough and hemoptysis. Negative for shortness of breath and wheezing.   Cardiovascular: Negative for chest pain, palpitations, orthopnea and leg swelling.  Gastrointestinal: Negative for abdominal pain, blood in stool, constipation, diarrhea, heartburn, nausea and vomiting.  Genitourinary: Negative for dysuria, frequency and urgency.  Musculoskeletal: Negative for back pain and myalgias.  Skin: Negative for itching and rash.  Neurological: Negative for dizziness, tingling, tremors, focal weakness, seizures, weakness and headaches.  Psychiatric/Behavioral: Negative for depression. The patient is not nervous/anxious.    DRUG ALLERGIES:  No Known Allergies VITALS:  Blood pressure 110/62, pulse 62, temperature 98.4 F (36.9 C), temperature source Oral, resp. rate 18, height 5\' 10"  (1.778 m), weight 113.2 kg, SpO2 95 %. PHYSICAL EXAMINATION:  Physical Exam HENT:     Head: Normocephalic and atraumatic.  Eyes:     Conjunctiva/sclera: Conjunctivae normal.     Pupils: Pupils are equal, round, and reactive to light.  Neck:     Thyroid: No thyromegaly.     Trachea: No tracheal deviation.  Cardiovascular:     Rate and Rhythm: Normal rate and regular rhythm.     Heart sounds: Normal heart sounds.  Pulmonary:     Effort: Pulmonary effort is normal. No  respiratory distress.     Breath sounds: Normal breath sounds. No wheezing.  Chest:     Chest wall: No tenderness.  Abdominal:     General: Bowel sounds are normal. There is no distension.     Palpations: Abdomen is soft.     Tenderness: There is no abdominal tenderness.  Musculoskeletal:        General: Normal range of motion.     Cervical back: Normal range of motion and neck supple.  Skin:    General: Skin is warm and dry.     Findings: No rash.  Neurological:     Mental Status: He is alert and oriented to person, place, and time.     Cranial Nerves: No cranial nerve deficit.    LABORATORY PANEL:  Male CBC Recent Labs  Lab 10/17/20 0525  WBC 9.0  HGB 9.2*  HCT 28.0*  PLT 224   ------------------------------------------------------------------------------------------------------------------ Chemistries  Recent Labs  Lab 10/15/20 1041 10/16/20 0423 10/17/20 0525  NA 134*   < > 137  K 4.2   < > 4.1  CL 97*   < > 104  CO2 25   < > 26  GLUCOSE 124*   < > 125*  BUN 14   < > 29*  CREATININE 1.35*   < > 1.18  CALCIUM 8.5*   < > 8.6*  AST 22  --   --   ALT 16  --   --   ALKPHOS 79  --   --   BILITOT 1.2  --   --    < > =  values in this interval not displayed.   RADIOLOGY:  No results found. ASSESSMENT AND PLAN:  64 year old male with a known history of coronary disease, diabetes mellitus, hypertension, obesity, depression, anxiety is admitted for pneumonia  Community-acquired pneumonia Patient was recently hospitalized for COVID-19 pneumonia.  He is on room air now - hemoptysis and persistent cough + CT chest showed lobar pneumonia Continue Rocephin and Zithromax Continue Robitussin, Tessalon Perles, Hycodan and gabapentin (increase the dose from 600 mg once daily to twice daily) Appreciate pulmonary input - Fungitell pending Procalcitonin trending down  Urine Legionella, strep pneumo, Respiratory viral panel negative May need bronchoscopy if hemoptysis  continue  Hypotension Likely due to infection.  Hold Imdur and metoprolol Blood pressure improving  Coronary artery disease status post stenting Holding aspirin and Plavix due to hemoptysis Continue statin  Chronic systolic CHF Well compensated at this time  Diabetes mellitus Tradjenta 5 mg p.o. daily and insulin Levemir 10 units subcu daily Continue sliding scale  Depression Continue trazodone and Celexa  GERD PPI  Obesity Body mass index is 35.81 kg/m.     Status is: Inpatient  Remains inpatient appropriate because:Ongoing diagnostic testing needed not appropriate for outpatient work up   Dispo: The patient is from: Home              Anticipated d/c is to: Home              Anticipated d/c date is: 2 days              Patient currently is not medically stable to d/c.  Pending Fungitell results.  May need bronchoscopy if hemoptysis continues     DVT prophylaxis:       SCDs Start: 10/15/20 1253     Family Communication: Updated patient's wife Steven Vang at [phone] on 11/2  All the records are reviewed and case discussed with Care Management/Social Worker. Management plans discussed with the patient, family and they are in agreement.  CODE STATUS: Full Code  TOTAL TIME TAKING CARE OF THIS PATIENT: 35 minutes.   More than 50% of the time was spent in counseling/coordination of care: YES  POSSIBLE D/C IN 1-2 DAYS, DEPENDING ON CLINICAL CONDITION.   Max Sane M.D on 10/17/2020 at 2:34 PM  [facility]   CC: Primary care physician; Eulogio Bear, NP  Note: This dictation was prepared with Dragon dictation along with smaller phrase technology. Any transcriptional errors that result from this process are unintentional.

## 2020-10-17 NOTE — Progress Notes (Signed)
Pulmonary Medicine          Date: 10/17/2020,   MRN# [id number] Steven Vang 1956/09/11     AdmissionWeight: 70.3 kg                 CurrentWeight: 113.2 kg   Referring physician: Dr. Francine Graven   CHIEF COMPLAINT:   Acute on chronic hypoxemic respiratory failure with nonmassive hemoptysis   HISTORY OF PRESENT ILLNESS   64 year old pleasant male with a history as below in [place] section including significant history of CHF with reduced ejection fraction, type 2 diabetes, CAD status post remote COVID-19 pneumonia with acute hypoxemic respiratory failure requiring hospitalization and received IV remdesivir therapy as well as systemic glucocorticoid therapy with Decadron while hospitalized.  Patient was initially positive for COVID-19 September 22, 2020.  He came in due to cough with hemoptysis..  In the ER he was found to be febrile tachypneic tachycardic.  He had CT chest done with PE protocol which was negative for acute venous thromboembolism however did show right lower lobe consolidated pneumonia as well as patchy consolidations with interval progression from previously noted groundglass opacification in similar bilateral midlung zones as well as right lower lobe on October 13 study.  Blood work was done in the ED which was unrevealing except for mild elevation in creatinine.  Pulmonary consultation placed for acute on chronic hypoxemic respiratory failure with pneumonia with hemoptysis. Overnight patient reports continued blood streaked expectoration but only with clearing throat or cough.    10/17/20- patient continues to have blood streaked expectoration with cough. He complains of right flank pain and had mild costovertebral angle tenderness on right.  He is able to walk around hallway without assistance and reports no dyspnea or exertional symptoms such as lightheadedness/presyncope/cough/tachycardia.  He remains on room air with normoxia throughout interview this am.    PAST  MEDICAL HISTORY   Past Medical History:  Diagnosis Date  . Abnormal EKG   . Anxiety disorder   . CAD (coronary artery disease)   . CHF (congestive heart failure) ([place])   . Chronic systolic heart failure ([place])   . Depression   . Diabetes mellitus ([place])   . GERD (gastroesophageal reflux disease)   . Hypertension   . Lipoma of head 03/31/2020  . Mixed hyperlipidemia   . Non morbid obesity due to excess calories   . Type 2 diabetes mellitus without complication, without long-term current use of insulin ([place])      SURGICAL HISTORY   Past Surgical History:  Procedure Laterality Date  . ANKLE FRACTURE SURGERY Right   . CARDIAC CATHETERIZATION    . CHOLECYSTECTOMY    . CORONARY STENT PLACEMENT  2015   3 stents  . HAND SURGERY Right   . LEFT HEART CATH AND CORONARY ANGIOGRAPHY Left 06/02/2020   Procedure: LEFT HEART CATH AND CORONARY ANGIOGRAPHY;  Surgeon: Dionisio David, MD;  Location: [facility];  Service: Cardiovascular;  Laterality: Left;     FAMILY HISTORY   Family History  Problem Relation Age of Onset  . CAD Mother   . Diabetes Mother   . Colon cancer Father   . Breast cancer Paternal Grandmother   . Stomach cancer Paternal Grandfather      SOCIAL HISTORY   Social History   Tobacco Use  . Smoking status: Former Smoker    Types: Cigarettes    Quit date: 1990    Years since quitting: 31.8  . Smokeless tobacco: Never  Used  Vaping Use  . Vaping Use: Never used  Substance Use Topics  . Alcohol use: Not Currently  . Drug use: Never     MEDICATIONS    Home Medication:    Current Medication:  Current Facility-Administered Medications:  .  azithromycin (ZITHROMAX) 500 mg in sodium chloride 0.9 % 250 mL IVPB, 500 mg, Intravenous, Q24H, Agbata, Tochukwu, MD, Stopped at 10/16/20 1807 .  benzonatate (TESSALON) capsule 100 mg, 100 mg, Oral, TID, Agbata, Tochukwu, MD, 100 mg at 10/17/20 0837 .  cefTRIAXone (ROCEPHIN) 2 g in sodium chloride 0.9 % 100  mL IVPB, 2 g, Intravenous, Q24H, Agbata, Tochukwu, MD, Stopped at 10/16/20 1642 .  citalopram (CELEXA) tablet 40 mg, 40 mg, Oral, Daily, Agbata, Tochukwu, MD, 40 mg at 10/17/20 0910 .  fluticasone (FLONASE) 50 MCG/ACT nasal spray 2 spray, 2 spray, Each Nare, Daily, Agbata, Tochukwu, MD .  gabapentin (NEURONTIN) capsule 600 mg, 600 mg, Oral, QHS, Agbata, Tochukwu, MD, 600 mg at 10/16/20 2125 .  guaiFENesin (ROBITUSSIN) 100 MG/5ML solution 100 mg, 5 mL, Oral, Q4H PRN, Agbata, Tochukwu, MD, 100 mg at 10/17/20 0527 .  HYDROcodone-homatropine (HYCODAN) 5-1.5 MG/5ML syrup 5 mL, 5 mL, Oral, TID, Max Sane, MD, 5 mL at 10/17/20 0911 .  insulin aspart (novoLOG) injection 0-15 Units, 0-15 Units, Subcutaneous, TID WC, Agbata, Tochukwu, MD, 2 Units at 10/17/20 0914 .  insulin detemir (LEVEMIR) injection 10 Units, 10 Units, Subcutaneous, Daily, Max Sane, MD, 10 Units at 10/16/20 1418 .  linagliptin (TRADJENTA) tablet 5 mg, 5 mg, Oral, Daily, Max Sane, MD, 5 mg at 10/17/20 0911 .  loperamide (IMODIUM) capsule 2 mg, 2 mg, Oral, Q6H PRN, Max Sane, MD, 2 mg at 10/16/20 1605 .  pantoprazole (PROTONIX) EC tablet 40 mg, 40 mg, Oral, Daily, Agbata, Tochukwu, MD, 40 mg at 10/17/20 0910 .  pravastatin (PRAVACHOL) tablet 20 mg, 20 mg, Oral, QPM, Agbata, Tochukwu, MD, 20 mg at 10/16/20 1707 .  traZODone (DESYREL) tablet 50 mg, 50 mg, Oral, QHS PRN, Agbata, Tochukwu, MD, 50 mg at 10/16/20 2143    ALLERGIES   Patient has no known allergies.     REVIEW OF SYSTEMS    Review of Systems:  Gen:  Denies  fever, sweats, chills weigh loss  HEENT: Denies blurred vision, double vision, ear pain, eye pain, hearing loss, nose bleeds, sore throat Cardiac:  No dizziness, chest pain or heaviness, chest tightness,edema Resp:   Denies cough or sputum porduction, shortness of breath,wheezing, hemoptysis,  Gi: Denies swallowing difficulty, stomach pain, nausea or vomiting, diarrhea, constipation, bowel  incontinence Gu:  Denies bladder incontinence, burning urine Ext:   Denies Joint pain, stiffness or swelling Skin: Denies  skin rash, easy bruising or bleeding or hives Endoc:  Denies polyuria, polydipsia , polyphagia or weight change Psych:   Denies depression, insomnia or hallucinations   Other:  All other systems negative   VS: BP (!) 101/52 (BP Location: Right Arm)   Pulse (!) 57   Temp 97.6 F (36.4 C) (Oral)   Resp 16   Ht 5' 10"  (1.778 m)   Wt 113.2 kg   SpO2 96%   BMI 35.81 kg/m      PHYSICAL EXAM    GENERAL:NAD, no fevers, chills, no weakness no fatigue HEAD: Normocephalic, atraumatic.  EYES: Pupils equal, round, reactive to light. Extraocular muscles intact. No scleral icterus.  MOUTH: Moist mucosal membrane. Dentition intact. No abscess noted.  EAR, NOSE, THROAT: Clear without exudates. No external lesions.  NECK: Supple.  No thyromegaly. No nodules. No JVD.  PULMONARY: Diffuse coarse rhonchi right sided +wheezes CARDIOVASCULAR: S1 and S2. Regular rate and rhythm. No murmurs, rubs, or gallops. No edema. Pedal pulses 2+ bilaterally.  GASTROINTESTINAL: Soft, nontender, nondistended. No masses. Positive bowel sounds. No hepatosplenomegaly.  MUSCULOSKELETAL: No swelling, clubbing, or edema. Range of motion full in all extremities.  NEUROLOGIC: Cranial nerves II through XII are intact. No gross focal neurological deficits. Sensation intact. Reflexes intact.  SKIN: No ulceration, lesions, rashes, or cyanosis. Skin warm and dry. Turgor intact.  PSYCHIATRIC: Mood, affect within normal limits. The patient is awake, alert and oriented x 3. Insight, judgment intact.       IMAGING       CT Angio Chest PE W and/or Wo Contrast  Result Date: 10/15/2020 CLINICAL DATA:  Suspect pulmonary embolus. High probability. Cough and hemoptysis. Diagnosed with Covid on 09/22/2020. Cough. EXAM: CT ANGIOGRAPHY CHEST WITH CONTRAST TECHNIQUE: Multidetector CT imaging of the chest was  performed using the standard protocol during bolus administration of intravenous contrast. Multiplanar CT image reconstructions and MIPs were obtained to evaluate the vascular anatomy. CONTRAST:  179m OMNIPAQUE IOHEXOL 350 MG/ML SOLN COMPARISON:  09/27/2020 FINDINGS: Cardiovascular: Heart size is normal. Coronary stents and coronary artery calcifications are present. There is minimal atherosclerotic calcification of the thoracic aorta, not associated with aneurysm. The pulmonary arteries are moderately well opacified by contrast bolus. There is no acute pulmonary embolus. Mediastinum/Nodes: The visualized portion of the thyroid gland has a normal appearance. Mildly prominent mediastinal lymph nodes are present and are likely reactive, similar in appearance to the previous exam. Small bilateral hilar lymph nodes are present, less than 1 centimeter in short axis. The esophagus is unremarkable. No axillary adenopathy. Lungs/Pleura: There are focal, discrete areas of consolidation throughout the lungs with a peripheral distribution. The previously seen primarily ground-glass opacities have progressed to more consolidative appearance. This is most remarkable in the RIGHT LOWER lobe where an area of patchy airspace filling opacity now appears as consolidation with air bronchograms. No pleural effusions. Large airways are patent. Upper Abdomen: No acute abnormality. Musculoskeletal: Mild degenerative changes in the midthoracic spine. Review of the MIP images confirms the above findings. IMPRESSION: 1. Technically adequate exam showing no acute pulmonary embolus. 2. Interval progression of bilateral pulmonary opacities, consistent with infectious/inflammatory process. 3. Coronary artery disease. 4. Aortic Atherosclerosis (ICD10-I70.0). Electronically Signed   By: ENolon NationsM.D.   On: 10/15/2020 12:18   CT Angio Chest PE W and/or Wo Contrast  Result Date: 09/27/2020 CLINICAL DATA:  Cough, shortness of breath.   COVID-19 positive EXAM: CT ANGIOGRAPHY CHEST WITH CONTRAST TECHNIQUE: Multidetector CT imaging of the chest was performed using the standard protocol during bolus administration of intravenous contrast. Multiplanar CT image reconstructions and MIPs were obtained to evaluate the vascular anatomy. CONTRAST:  779mOMNIPAQUE IOHEXOL 350 MG/ML SOLN COMPARISON:  Same day chest x-ray FINDINGS: Cardiovascular: Satisfactory opacification of the pulmonary arteries to the segmental level. No evidence of pulmonary embolism. Thoracic aorta is nonaneurysmal. Minimal atherosclerotic calcification of the aortic arch. Coronary artery stent is noted. Normal heart size. No pericardial effusion. Mediastinum/Nodes: Mildly prominent subcarinal and right hilar lymph nodes. No axillary lymphadenopathy. Thyroid, trachea, and esophagus within normal limits. Lungs/Pleura: Patchy posterior right lower lobe airspace consolidation with surrounding ground-glass opacity. Additional multifocal areas of predominantly ground-glass opacity within the peripheral aspects of both lung fields. No pleural effusion. No pneumothorax. There are a few small scattered calcified granulomas within the left upper lobe. Upper  Abdomen: No acute abnormality. Musculoskeletal: No chest wall abnormality. No acute or significant osseous findings. Review of the MIP images confirms the above findings. IMPRESSION: 1. No evidence of pulmonary embolism. 2. Patchy posterior right lower lobe airspace consolidation. Additional multifocal areas of predominantly ground-glass opacity within the peripheral aspects of both lung fields. Findings are suspicious for multifocal pneumonia in the setting of COVID-19 infection. 3. Mildly prominent subcarinal and right hilar lymph nodes, likely reactive. Electronically Signed   By: Davina Poke D.O.   On: 09/27/2020 13:27   DG Chest Portable 1 View  Result Date: 10/01/2020 CLINICAL DATA:  Shortness of.  COVID-19 positive EXAM:  PORTABLE CHEST 1 VIEW COMPARISON:  September 29, 2020. FINDINGS: There is patchy opacity in the lung bases, similar to recent study. Lungs elsewhere clear. Heart is upper normal in size with pulmonary vascularity normal. No adenopathy. No bone lesions. IMPRESSION: Bibasilar airspace opacity consistent with multifocal pneumonia. Suspect atypical organism etiology given the history and radiograph appearance. Heart size upper normal. No adenopathy. Electronically Signed   By: Lowella Grip III M.D.   On: 10/01/2020 12:26   DG Chest Port 1 View  Result Date: 09/29/2020 CLINICAL DATA:  Acute hypoxemic respiratory failure due to COVID 19 EXAM: PORTABLE CHEST 1 VIEW COMPARISON:  09/27/2020 FINDINGS: Mild patchy bibasilar airspace disease has progressed in the interval. Upper lobes appear clear. Heart size and vascularity normal. No effusion. IMPRESSION: Progression of patchy bibasilar airspace disease compatible with COVID pneumonia. Electronically Signed   By: Franchot Gallo M.D.   On: 09/29/2020 11:23   DG Chest Port 1 View  Result Date: 09/27/2020 CLINICAL DATA:  Possible sepsis. COVID positive with shortness of breath, body aches and cough. EXAM: PORTABLE CHEST 1 VIEW COMPARISON:  Chest radiograph 07/01/2014 FINDINGS: Low lung volumes, which limits evaluation. No focal consolidation. Cardiac silhouette is within normal limits and mildly accentuated by low lung volumes and portable technique. No visible pleural effusions pneumothorax. No acute osseous abnormality. IMPRESSION: No evidence of acute cardiopulmonary disease on this AP portable radiograph with low lung volumes. Electronically Signed   By: Margaretha Sheffield MD   On: 09/27/2020 11:02      ASSESSMENT/PLAN   Acute on chronic hypoxemic respiratory failure -Due to bilateral pneumonia worse at the right lower lobe possible aspiration of blood during episodes of hemoptysis -Bilateral consolidated infiltrates of the mid lung zones with interval  progression from previous study in October 13 as elucidated in pictorial documentation above  -We will rule out fungal pneumonia-Fungitell serum-in process -Procalcitonin trend for antimicrobial guidance-0.39>>>0.37 -Legionella, strep pneumoniae urinary antigen-negative -Mild elevation lactic acid will continue to trend -Conservative fluid balance due to history of CHF currently with prerenal azotemia and mild AKI-+1200cc clinically improved with improved renal function -Expectorated sputum respiratory culture-moderate GPC recultured -Blood cultures x2 -Respiratory viral panel -negative -Caution using prophylactic anticoagulation for DVT -will consider bronchoscopic evaluation if hemoptysis does not resolve -Ferritin and ESR for background inflammatory changes- trending upward   Community-acquired pneumonia -due to gram positive cocci likely streptococal infection -Agree with empiric Rocephin and Zithromax status post x1 Maxipime -Agree with gabapentin and Robitussin for cough -will add Hycodan to suppress cough for now -continue current therapy   - GPC + in respiratory culture  Acute kidney injury stage I-resolved -Likely due to dehydration secondary to acutely ill status  Severe hyperglycemia-resolved -Due to stress response -Monitor sugars closely while acutely ill supplement with insulin sliding scale for now     Thank you for  allowing me to participate in the care of this patient.   Patient/Family are satisfied with care plan and all questions have been answered.  This document was prepared using Dragon voice recognition software and may include unintentional dictation errors.     Ottie Glazier, M.D.  Division of [place]

## 2020-10-17 NOTE — Evaluation (Signed)
Physical Therapy Evaluation Patient Details Name: Steven Vang MRN: [id number] DOB: February 22, 1956 Today's Date: 10/17/2020   History of Present Illness  Patient is a  64 y.o. male with a history of CAD, ICM, HFrEF, HTN, HLD, T2DM, obesity and covid-19 diagnosed 10/8 who was admitted for covid pneumonia, discharged on 10/16 to complete remdesivir 10/17 and steroids by mouth at home. Since discharge patient had persistant cough and copious amounts of hemoptysis. He presented to the ER with fever and pleuritic chest pain. CT is negative for PE.    Clinical Impression  Patient is a very pleasant 64 year old male who presents with limited capacity for mobility and desaturation with ambulation. Prior to hospital admission, pt was independent, working as a Chief Strategy Officer, and lives with his wife who is a Marine scientist.  Currently pt is mod I/I with bed mobility and transfers and supervision for ambulation due to Sp02 dropping to 85% with ambulating in hallway and HR elevating to 116. Nursing notified of desaturation with patient reporting no dizziness or fatigue.  Patient oxygen returned to >90 percent with seated rest break. Patient educated on standing interventions for strengthening as well as sit to stands. Pt would benefit from skilled PT to address noted impairments and functional limitations.  Upon hospital discharge, pt would benefit from outpatient physical therapy to increase capacity for functional mobility while maintaining therapeutic values of vitals.     Follow Up Recommendations Outpatient PT    Equipment Recommendations  None recommended by PT    Recommendations for Other Services       Precautions / Restrictions Precautions Precautions: Other (comment) Precaution Comments: monitor Sp02 Restrictions Weight Bearing Restrictions: No      Mobility  Bed Mobility Overal bed mobility: Independent             General bed mobility comments: supine to sit and sit to supine with HOB  elevated no assistance required no UE support required    Transfers Overall transfer level: Modified independent               General transfer comment: STS requires extra time due to shortness of breath. stable with CGA/supervision  Ambulation/Gait Ambulation/Gait assistance: Min guard;Supervision Gait Distance (Feet): 180 Feet Assistive device: None Gait Pattern/deviations: WFL(Within Functional Limits) Gait velocity: 0.8 m/s   General Gait Details: Sp02 dropped to 85% with ambulation, nursing notified  Stairs            Wheelchair Mobility    Modified Rankin (Stroke Patients Only)       Balance Overall balance assessment: Modified Independent (able to perform STS, ambulation with CGA)                                           Pertinent Vitals/Pain Pain Assessment: No/denies pain    Home Living Family/patient expects to be discharged to:: Private residence Living Arrangements: Spouse/significant other Available Help at Discharge: Family Type of Home: House Home Access: Stairs to enter   Technical brewer of Steps: 6   Home Equipment: None      Prior Function Level of Independence: Independent         Comments: Patient is a Chief Strategy Officer, wife is a Marine scientist at [employer]. Rides motorcycles for fun.     Hand Dominance        Extremity/Trunk Assessment   Upper Extremity Assessment Upper Extremity Assessment: Overall WFL for  tasks assessed    Lower Extremity Assessment Lower Extremity Assessment: Generalized weakness (grossly 4+/5 bilaterally with ankles 3+/5)    Cervical / Trunk Assessment Cervical / Trunk Assessment: Normal  Communication   Communication: No difficulties  Cognition Arousal/Alertness: Awake/alert Behavior During Therapy: WFL for tasks assessed/performed Overall Cognitive Status: Within Functional Limits for tasks assessed                                 General Comments: Patient A and O x  4      General Comments General comments (skin integrity, edema, etc.): patient appears well groomed and nourished.    Exercises Other Exercises Other Exercises: Patient educated on role of PT in acute care setting, breathing techniques to increase Sp02 to >90%, standing strengthening interventions (march, hip extension, hip abduction 10x each), and sit to stands for strengthening.   Assessment/Plan    PT Assessment Patient needs continued PT services  PT Problem List Decreased strength;Decreased activity tolerance;Decreased mobility;Cardiopulmonary status limiting activity       PT Treatment Interventions Gait training;Stair training;Functional mobility training;Neuromuscular re-education;Balance training;Therapeutic exercise;Therapeutic activities;Patient/family education    PT Goals (Current goals can be found in the Care Plan section)  Acute Rehab PT Goals Patient Stated Goal: to get my strength back PT Goal Formulation: With patient Time For Goal Achievement: 10/31/20 Potential to Achieve Goals: Good    Frequency Min 2X/week   Barriers to discharge        Co-evaluation               AM-PAC PT "6 Clicks" Mobility  Outcome Measure Help needed turning from your back to your side while in a flat bed without using bedrails?: None Help needed moving from lying on your back to sitting on the side of a flat bed without using bedrails?: None Help needed moving to and from a bed to a chair (including a wheelchair)?: None Help needed standing up from a chair using your arms (e.g., wheelchair or bedside chair)?: None Help needed to walk in hospital room?: A Little Help needed climbing 3-5 steps with a railing? : A Little 6 Click Score: 22    End of Session Equipment Utilized During Treatment: Gait belt Activity Tolerance: Patient tolerated treatment well;Other (comment) (Sp02 dropped <90 with ambulation) Patient left: in bed;with call bell/phone within reach Nurse  Communication: Mobility status;Other (comment) (Sp02 drop with ambulation) PT Visit Diagnosis: Other abnormalities of gait and mobility (R26.89);Muscle weakness (generalized) (M62.81);Difficulty in walking, not elsewhere classified (R26.2)    Time: 1683-7290 PT Time Calculation (min) (ACUTE ONLY): 16 min   Charges:   PT Evaluation $PT Eval Moderate Complexity: 1 Mod         Janna Arch, PT, DPT   10/17/2020, 10:03 AM

## 2020-10-18 ENCOUNTER — Inpatient Hospital Stay: Payer: 59

## 2020-10-18 LAB — CBC
HCT: 30.8 % — ABNORMAL LOW (ref 39.0–52.0)
Hemoglobin: 9.9 g/dL — ABNORMAL LOW (ref 13.0–17.0)
MCH: 25.8 pg — ABNORMAL LOW (ref 26.0–34.0)
MCHC: 32.1 g/dL (ref 30.0–36.0)
MCV: 80.4 fL (ref 80.0–100.0)
Platelets: 233 10*3/uL (ref 150–400)
RBC: 3.83 MIL/uL — ABNORMAL LOW (ref 4.22–5.81)
RDW: 13.6 % (ref 11.5–15.5)
WBC: 6.5 10*3/uL (ref 4.0–10.5)
nRBC: 0 % (ref 0.0–0.2)

## 2020-10-18 LAB — GLUCOSE, CAPILLARY
Glucose-Capillary: 87 mg/dL (ref 70–99)
Glucose-Capillary: 91 mg/dL (ref 70–99)
Glucose-Capillary: 169 mg/dL — ABNORMAL HIGH (ref 70–99)
Glucose-Capillary: 266 mg/dL — ABNORMAL HIGH (ref 70–99)

## 2020-10-18 LAB — CULTURE, RESPIRATORY W GRAM STAIN: Culture: NORMAL

## 2020-10-18 LAB — FUNGITELL, SERUM

## 2020-10-18 MED ORDER — PREDNISONE 20 MG PO TABS
20.0000 mg | ORAL_TABLET | Freq: Every day | ORAL | Status: DC
  Administered 2020-10-18 – 2020-10-19 (×2): 20 mg via ORAL
  Filled 2020-10-18 (×2): qty 1

## 2020-10-18 MED ORDER — LIDOCAINE 5 % EX PTCH
1.0000 | MEDICATED_PATCH | CUTANEOUS | Status: DC
  Administered 2020-10-18 – 2020-10-19 (×2): 1 via TRANSDERMAL
  Filled 2020-10-18 (×2): qty 1

## 2020-10-18 MED ORDER — INSULIN DETEMIR 100 UNIT/ML ~~LOC~~ SOLN
8.0000 [IU] | Freq: Every day | SUBCUTANEOUS | Status: DC
  Administered 2020-10-19: 10:00:00 8 [IU] via SUBCUTANEOUS
  Filled 2020-10-18 (×2): qty 0.08

## 2020-10-18 NOTE — Progress Notes (Signed)
Pulmonary Medicine          Date: 10/18/2020,   MRN# [id number] Steven Vang 03-Sep-1956     AdmissionWeight: 70.3 kg                 CurrentWeight: 113.2 kg   Referring physician: Dr. Francine Graven   CHIEF COMPLAINT:   Acute on chronic hypoxemic respiratory failure with nonmassive hemoptysis   HISTORY OF PRESENT ILLNESS   64 year old pleasant male with a history as below in [place] section including significant history of CHF with reduced ejection fraction, type 2 diabetes, CAD status post remote COVID-19 pneumonia with acute hypoxemic respiratory failure requiring hospitalization and received IV remdesivir therapy as well as systemic glucocorticoid therapy with Decadron while hospitalized.  Patient was initially positive for COVID-19 September 22, 2020.  He came in due to cough with hemoptysis..  In the ER he was found to be febrile tachypneic tachycardic.  He had CT chest done with PE protocol which was negative for acute venous thromboembolism however did show right lower lobe consolidated pneumonia as well as patchy consolidations with interval progression from previously noted groundglass opacification in similar bilateral midlung zones as well as right lower lobe on October 13 study.  Blood work was done in the ED which was unrevealing except for mild elevation in creatinine.  Pulmonary consultation placed for acute on chronic hypoxemic respiratory failure with pneumonia with hemoptysis. Overnight patient reports continued blood streaked expectoration but only with clearing throat or cough.    10/17/20- patient continues to have blood streaked expectoration with cough. He complains of right flank pain and had mild costovertebral angle tenderness on right.  He is able to walk around hallway without assistance and reports no dyspnea or exertional symptoms such as lightheadedness/presyncope/cough/tachycardia.  He remains on room air with normoxia throughout interview this am.     10/18/20- patient further improved clinically. His CXR is improved more on right as illlustrated below. He is still coughing up phlegm and some blood streaking on expectoration. We discussed continuing current regimen.    PAST MEDICAL HISTORY   Past Medical History:  Diagnosis Date  . Abnormal EKG   . Anxiety disorder   . CAD (coronary artery disease)   . CHF (congestive heart failure) ([place])   . Chronic systolic heart failure ([place])   . Depression   . Diabetes mellitus ([place])   . GERD (gastroesophageal reflux disease)   . Hypertension   . Lipoma of head 03/31/2020  . Mixed hyperlipidemia   . Non morbid obesity due to excess calories   . Type 2 diabetes mellitus without complication, without long-term current use of insulin ([place])      SURGICAL HISTORY   Past Surgical History:  Procedure Laterality Date  . ANKLE FRACTURE SURGERY Right   . CARDIAC CATHETERIZATION    . CHOLECYSTECTOMY    . CORONARY STENT PLACEMENT  2015   3 stents  . HAND SURGERY Right   . LEFT HEART CATH AND CORONARY ANGIOGRAPHY Left 06/02/2020   Procedure: LEFT HEART CATH AND CORONARY ANGIOGRAPHY;  Surgeon: Dionisio David, MD;  Location: [facility];  Service: Cardiovascular;  Laterality: Left;     FAMILY HISTORY   Family History  Problem Relation Age of Onset  . CAD Mother   . Diabetes Mother   . Colon cancer Father   . Breast cancer Paternal Grandmother   . Stomach cancer Paternal Grandfather      SOCIAL HISTORY  Social History   Tobacco Use  . Smoking status: Former Smoker    Types: Cigarettes    Quit date: 1990    Years since quitting: 31.8  . Smokeless tobacco: Never Used  Vaping Use  . Vaping Use: Never used  Substance Use Topics  . Alcohol use: Not Currently  . Drug use: Never     MEDICATIONS    Home Medication:    Current Medication:  Current Facility-Administered Medications:  .  azithromycin (ZITHROMAX) 500 mg in sodium chloride 0.9 % 250 mL IVPB, 500 mg,  Intravenous, Q24H, Agbata, Tochukwu, MD, Stopped at 10/17/20 2349 .  benzonatate (TESSALON) capsule 100 mg, 100 mg, Oral, TID, Agbata, Tochukwu, MD, 100 mg at 10/18/20 0819 .  cefTRIAXone (ROCEPHIN) 2 g in sodium chloride 0.9 % 100 mL IVPB, 2 g, Intravenous, Q24H, Agbata, Tochukwu, MD, Stopped at 10/17/20 2135 .  citalopram (CELEXA) tablet 40 mg, 40 mg, Oral, Daily, Agbata, Tochukwu, MD, 40 mg at 10/18/20 0819 .  fluticasone (FLONASE) 50 MCG/ACT nasal spray 2 spray, 2 spray, Each Nare, Daily, Agbata, Tochukwu, MD, 2 spray at 10/18/20 0819 .  gabapentin (NEURONTIN) capsule 600 mg, 600 mg, Oral, BID, Max Sane, MD, 600 mg at 10/18/20 0819 .  guaiFENesin (ROBITUSSIN) 100 MG/5ML solution 100 mg, 5 mL, Oral, Q4H PRN, Agbata, Tochukwu, MD, 100 mg at 10/18/20 0419 .  HYDROcodone-homatropine (HYCODAN) 5-1.5 MG/5ML syrup 5 mL, 5 mL, Oral, TID, Max Sane, MD, 5 mL at 10/17/20 2042 .  insulin aspart (novoLOG) injection 0-15 Units, 0-15 Units, Subcutaneous, TID WC, Agbata, Tochukwu, MD, 2 Units at 10/17/20 1652 .  insulin detemir (LEVEMIR) injection 10 Units, 10 Units, Subcutaneous, Daily, Max Sane, MD, 10 Units at 10/18/20 0819 .  linagliptin (TRADJENTA) tablet 5 mg, 5 mg, Oral, Daily, Max Sane, MD, 5 mg at 10/18/20 0819 .  loperamide (IMODIUM) capsule 2 mg, 2 mg, Oral, Q6H PRN, Max Sane, MD, 2 mg at 10/16/20 1605 .  pantoprazole (PROTONIX) EC tablet 40 mg, 40 mg, Oral, Daily, Agbata, Tochukwu, MD, 40 mg at 10/18/20 0819 .  pravastatin (PRAVACHOL) tablet 20 mg, 20 mg, Oral, QPM, Agbata, Tochukwu, MD, 20 mg at 10/17/20 1651 .  traZODone (DESYREL) tablet 50 mg, 50 mg, Oral, QHS PRN, Agbata, Tochukwu, MD, 50 mg at 10/17/20 2041    ALLERGIES   Patient has no known allergies.     REVIEW OF SYSTEMS    Review of Systems:  Gen:  Denies  fever, sweats, chills weigh loss  HEENT: Denies blurred vision, double vision, ear pain, eye pain, hearing loss, nose bleeds, sore throat Cardiac:  No  dizziness, chest pain or heaviness, chest tightness,edema Resp:   Denies cough or sputum porduction, shortness of breath,wheezing, hemoptysis,  Gi: Denies swallowing difficulty, stomach pain, nausea or vomiting, diarrhea, constipation, bowel incontinence Gu:  Denies bladder incontinence, burning urine Ext:   Denies Joint pain, stiffness or swelling Skin: Denies  skin rash, easy bruising or bleeding or hives Endoc:  Denies polyuria, polydipsia , polyphagia or weight change Psych:   Denies depression, insomnia or hallucinations   Other:  All other systems negative   VS: BP 123/71 (BP Location: Left Arm)   Pulse 67   Temp 98 F (36.7 C) (Oral)   Resp 16   Ht 5' 10"  (1.778 m)   Wt 113.2 kg   SpO2 97%   BMI 35.81 kg/m      PHYSICAL EXAM    GENERAL:NAD, no fevers, chills, no weakness no fatigue HEAD: Normocephalic,  atraumatic.  EYES: Pupils equal, round, reactive to light. Extraocular muscles intact. No scleral icterus.  MOUTH: Moist mucosal membrane. Dentition intact. No abscess noted.  EAR, NOSE, THROAT: Clear without exudates. No external lesions.  NECK: Supple. No thyromegaly. No nodules. No JVD.  PULMONARY: Diffuse coarse rhonchi right sided +wheezes CARDIOVASCULAR: S1 and S2. Regular rate and rhythm. No murmurs, rubs, or gallops. No edema. Pedal pulses 2+ bilaterally.  GASTROINTESTINAL: Soft, nontender, nondistended. No masses. Positive bowel sounds. No hepatosplenomegaly.  MUSCULOSKELETAL: No swelling, clubbing, or edema. Range of motion full in all extremities.  NEUROLOGIC: Cranial nerves II through XII are intact. No gross focal neurological deficits. Sensation intact. Reflexes intact.  SKIN: No ulceration, lesions, rashes, or cyanosis. Skin warm and dry. Turgor intact.  PSYCHIATRIC: Mood, affect within normal limits. The patient is awake, alert and oriented x 3. Insight, judgment intact.       IMAGING       CT Angio Chest PE W and/or Wo Contrast  Result Date:  10/15/2020 CLINICAL DATA:  Suspect pulmonary embolus. High probability. Cough and hemoptysis. Diagnosed with Covid on 09/22/2020. Cough. EXAM: CT ANGIOGRAPHY CHEST WITH CONTRAST TECHNIQUE: Multidetector CT imaging of the chest was performed using the standard protocol during bolus administration of intravenous contrast. Multiplanar CT image reconstructions and MIPs were obtained to evaluate the vascular anatomy. CONTRAST:  12m OMNIPAQUE IOHEXOL 350 MG/ML SOLN COMPARISON:  09/27/2020 FINDINGS: Cardiovascular: Heart size is normal. Coronary stents and coronary artery calcifications are present. There is minimal atherosclerotic calcification of the thoracic aorta, not associated with aneurysm. The pulmonary arteries are moderately well opacified by contrast bolus. There is no acute pulmonary embolus. Mediastinum/Nodes: The visualized portion of the thyroid gland has a normal appearance. Mildly prominent mediastinal lymph nodes are present and are likely reactive, similar in appearance to the previous exam. Small bilateral hilar lymph nodes are present, less than 1 centimeter in short axis. The esophagus is unremarkable. No axillary adenopathy. Lungs/Pleura: There are focal, discrete areas of consolidation throughout the lungs with a peripheral distribution. The previously seen primarily ground-glass opacities have progressed to more consolidative appearance. This is most remarkable in the RIGHT LOWER lobe where an area of patchy airspace filling opacity now appears as consolidation with air bronchograms. No pleural effusions. Large airways are patent. Upper Abdomen: No acute abnormality. Musculoskeletal: Mild degenerative changes in the midthoracic spine. Review of the MIP images confirms the above findings. IMPRESSION: 1. Technically adequate exam showing no acute pulmonary embolus. 2. Interval progression of bilateral pulmonary opacities, consistent with infectious/inflammatory process. 3. Coronary artery disease.  4. Aortic Atherosclerosis (ICD10-I70.0). Electronically Signed   By: ENolon NationsM.D.   On: 10/15/2020 12:18   CT Angio Chest PE W and/or Wo Contrast  Result Date: 09/27/2020 CLINICAL DATA:  Cough, shortness of breath.  COVID-19 positive EXAM: CT ANGIOGRAPHY CHEST WITH CONTRAST TECHNIQUE: Multidetector CT imaging of the chest was performed using the standard protocol during bolus administration of intravenous contrast. Multiplanar CT image reconstructions and MIPs were obtained to evaluate the vascular anatomy. CONTRAST:  742mOMNIPAQUE IOHEXOL 350 MG/ML SOLN COMPARISON:  Same day chest x-ray FINDINGS: Cardiovascular: Satisfactory opacification of the pulmonary arteries to the segmental level. No evidence of pulmonary embolism. Thoracic aorta is nonaneurysmal. Minimal atherosclerotic calcification of the aortic arch. Coronary artery stent is noted. Normal heart size. No pericardial effusion. Mediastinum/Nodes: Mildly prominent subcarinal and right hilar lymph nodes. No axillary lymphadenopathy. Thyroid, trachea, and esophagus within normal limits. Lungs/Pleura: Patchy posterior right lower lobe airspace consolidation  with surrounding ground-glass opacity. Additional multifocal areas of predominantly ground-glass opacity within the peripheral aspects of both lung fields. No pleural effusion. No pneumothorax. There are a few small scattered calcified granulomas within the left upper lobe. Upper Abdomen: No acute abnormality. Musculoskeletal: No chest wall abnormality. No acute or significant osseous findings. Review of the MIP images confirms the above findings. IMPRESSION: 1. No evidence of pulmonary embolism. 2. Patchy posterior right lower lobe airspace consolidation. Additional multifocal areas of predominantly ground-glass opacity within the peripheral aspects of both lung fields. Findings are suspicious for multifocal pneumonia in the setting of COVID-19 infection. 3. Mildly prominent subcarinal and  right hilar lymph nodes, likely reactive. Electronically Signed   By: Davina Poke D.O.   On: 09/27/2020 13:27   DG Chest Port 1 View  Result Date: 10/18/2020 CLINICAL DATA:  64 year old male with history of pneumonia. EXAM: PORTABLE CHEST 1 VIEW COMPARISON:  Chest x-ray 10/01/2020. FINDINGS: Lung volumes are slightly low. There continues to be patchy ill-defined opacities and areas of interstitial prominence throughout the mid to lower lungs bilaterally, most compatible with multilobar pneumonia. Overall, there has been little change in aeration. No pleural effusions. No pneumothorax. No evidence of pulmonary edema. Heart size is normal. Upper mediastinal contours are within normal limits. IMPRESSION: 1. Overall, the radiographic appearance the chest is very similar to the prior study with persistent evidence of multilobar bilateral pneumonia and/or developing post infectious fibrosis. Electronically Signed   By: Vinnie Langton M.D.   On: 10/18/2020 07:56   DG Chest Portable 1 View  Result Date: 10/01/2020 CLINICAL DATA:  Shortness of.  COVID-19 positive EXAM: PORTABLE CHEST 1 VIEW COMPARISON:  September 29, 2020. FINDINGS: There is patchy opacity in the lung bases, similar to recent study. Lungs elsewhere clear. Heart is upper normal in size with pulmonary vascularity normal. No adenopathy. No bone lesions. IMPRESSION: Bibasilar airspace opacity consistent with multifocal pneumonia. Suspect atypical organism etiology given the history and radiograph appearance. Heart size upper normal. No adenopathy. Electronically Signed   By: Lowella Grip III M.D.   On: 10/01/2020 12:26   DG Chest Port 1 View  Result Date: 09/29/2020 CLINICAL DATA:  Acute hypoxemic respiratory failure due to COVID 19 EXAM: PORTABLE CHEST 1 VIEW COMPARISON:  09/27/2020 FINDINGS: Mild patchy bibasilar airspace disease has progressed in the interval. Upper lobes appear clear. Heart size and vascularity normal. No effusion.  IMPRESSION: Progression of patchy bibasilar airspace disease compatible with COVID pneumonia. Electronically Signed   By: Franchot Gallo M.D.   On: 09/29/2020 11:23   DG Chest Port 1 View  Result Date: 09/27/2020 CLINICAL DATA:  Possible sepsis. COVID positive with shortness of breath, body aches and cough. EXAM: PORTABLE CHEST 1 VIEW COMPARISON:  Chest radiograph 07/01/2014 FINDINGS: Low lung volumes, which limits evaluation. No focal consolidation. Cardiac silhouette is within normal limits and mildly accentuated by low lung volumes and portable technique. No visible pleural effusions pneumothorax. No acute osseous abnormality. IMPRESSION: No evidence of acute cardiopulmonary disease on this AP portable radiograph with low lung volumes. Electronically Signed   By: Margaretha Sheffield MD   On: 09/27/2020 11:02            ASSESSMENT/PLAN   Acute on chronic hypoxemic respiratory failure -Due to bilateral pneumonia worse at the right lower lobe possible aspiration of blood during episodes of hemoptysis -Bilateral consolidated infiltrates of the mid lung zones with interval progression from previous study in October 13 as elucidated in pictorial documentation above  -  We will rule out fungal pneumonia-Fungitell serum-in process -Procalcitonin trend for antimicrobial guidance-0.39>>>0.37 -Legionella, strep pneumoniae urinary antigen-negative -Mild elevation lactic acid will continue to trend -Conservative fluid balance due to history of CHF currently with prerenal azotemia and mild AKI-+1200cc clinically improved with improved renal function -Expectorated sputum respiratory culture-moderate GPC recultured -Blood cultures x2 -Respiratory viral panel -negative -Caution using prophylactic anticoagulation for DVT -will consider bronchoscopic evaluation if hemoptysis does not resolve -Ferritin and ESR for background inflammatory changes- trending upward -will start prednisone 39m today -  10/18/20  Community-acquired pneumonia -due to gram positive cocci likely streptococal infection -Agree with empiric Rocephin and Zithromax status post x1 Maxipime -Agree with gabapentin and Robitussin for cough -will add Hycodan to suppress cough for now -continue current therapy   - GPC + in respiratory culture  Acute kidney injury stage I-resolved -Likely due to dehydration secondary to acutely ill status  Severe hyperglycemia-resolved -Due to stress response -Monitor sugars closely while acutely ill supplement with insulin sliding scale for now     Thank you for allowing me to participate in the care of this patient.   Patient/Family are satisfied with care plan and all questions have been answered.  This document was prepared using Dragon voice recognition software and may include unintentional dictation errors.     FOttie Glazier M.D.  Division of [place]

## 2020-10-18 NOTE — Progress Notes (Addendum)
PROGRESS NOTE    Steven Vang  MCN:[id number] DOB: 06-Nov-1956 DOA: 10/15/2020 PCP: Eulogio Bear, NP   Chief Complaint  Patient presents with  . Cough  . Hemoptysis   Brief Narrative:  64 year old male with Locke Barrell known history of coronary disease, diabetes mellitus, hypertension, obesity, depression, anxiety and recent COVID 19 infection (positive 10/13) is admitted for pneumonia  Assessment & Plan:   Principal Problem:   HCAP (healthcare-associated pneumonia) Active Problems:   Chronic systolic heart failure (HCC)   Coronary artery disease   GERD (gastroesophageal reflux disease)   Type 2 diabetes mellitus without complication, without long-term current use of insulin ([place])   Depression  Community-acquired pneumonia Patient was recently hospitalized for COVID-19 pneumonia.  He is on room air now - hemoptysis and persistent cough + CT PE protocol without PE, progression of bilateral pulm opacities (10/31) CXR 11/3 with multilobar bilateral pneumonia and/or developing post infectious fibrosis Continue Rocephin and Zithromax (10/31 - present) Continue Robitussin, Tessalon Perles, Hycodan and gabapentin (increase the dose from 600 mg once daily to twice daily) Appreciate pulmonary input - Fungitell pending Procalcitonin trending down  Urine Legionella, strep pneumo, Respiratory viral panel negative Sputum cx with normal resp flora -> repeat from 11/1 pending May need bronchoscopy if hemoptysis continue Started on prednisone per pulm today  Hypotension Likely due to infection.  Hold Imdur and metoprolol Intermittent, follow, seems asymptomatic, seems to be improving  Coronary artery disease status post stenting Holding aspirin and Plavix due to hemoptysis Continue statin  Chronic systolic CHF Well compensated at this time  Diabetes mellitus Tradjenta 5 mg p.o. daily Levemir will decrease to 8 units with fasting BG < 100 Continue sliding  scale  Depression Continue trazodone and Celexa  GERD PPI  Obesity Body mass index is 35.81 kg/m.  DVT prophylaxis: SCD Code Status: full  Family Communication: none at bedside - wife over phone Disposition:   Status is: Inpatient  Remains inpatient appropriate because:Inpatient level of care appropriate due to severity of illness   Dispo: The patient is from: Home              Anticipated d/c is to: Home              Anticipated d/c date is: 1 day              Patient currently is not medically stable to d/c.  Consultants:   pulm   Procedures:   none  Antimicrobials:  Anti-infectives (From admission, onward)   Start     Dose/Rate Route Frequency Ordered Stop   10/15/20 1700  cefTRIAXone (ROCEPHIN) 2 g in sodium chloride 0.9 % 100 mL IVPB        2 g 200 mL/hr over 30 Minutes Intravenous Every 24 hours 10/15/20 1256 10/20/20 1659   10/15/20 1700  azithromycin (ZITHROMAX) 500 mg in sodium chloride 0.9 % 250 mL IVPB        500 mg 250 mL/hr over 60 Minutes Intravenous Every 24 hours 10/15/20 1256 10/20/20 1659   10/15/20 1215  ceFEPIme (MAXIPIME) 2 g in sodium chloride 0.9 % 100 mL IVPB        2 g 200 mL/hr over 30 Minutes Intravenous  Once 10/15/20 1201 10/15/20 1339   10/15/20 1215  vancomycin (VANCOCIN) IVPB 1000 mg/200 mL premix        1,000 mg 200 mL/hr over 60 Minutes Intravenous  Once 10/15/20 1201 10/15/20 1456      Subjective: No new  complaints, overall feels better Still with coguh, some hemoptysis, lessening  Objective: Vitals:   10/18/20 0337 10/18/20 0539 10/18/20 0832 10/18/20 1139  BP: 112/81 (!) 99/56 123/71 125/73  Pulse: 87 72 67 65  Resp:  16 16 16   Temp:  98.3 F (36.8 C) 98 F (36.7 C) 98.2 F (36.8 C)  TempSrc:   Oral Oral  SpO2: 94% 95% 97% 96%  Weight:      Height:        Intake/Output Summary (Last 24 hours) at 10/18/2020 1252 Last data filed at 10/18/2020 1040 Gross per 24 hour  Intake 932.58 ml  Output --  Net  932.58 ml   Filed Weights   10/15/20 1011 10/15/20 1812  Weight: 70.3 kg 113.2 kg    Examination:  General exam: Appears calm and comfortable  Respiratory system: Clear to auscultation. Respiratory effort normal. Cardiovascular system: S1 & S2 heard, RRR.  Gastrointestinal system: Abdomen is nondistended, soft and nontender.  Central nervous system: Alert and oriented. No focal neurological deficits. Extremities: no LEE Skin: No rashes, lesions or ulcers Psychiatry: Judgement and insight appear normal. Mood & affect appropriate.     Data Reviewed: I have personally reviewed following labs and imaging studies  CBC: Recent Labs  Lab 10/15/20 1041 10/16/20 0423 10/17/20 0525 10/18/20 0526  WBC 10.1 9.3 9.0 6.5  NEUTROABS 8.8*  --   --   --   HGB 10.2* 9.6* 9.2* 9.9*  HCT 32.0* 30.1* 28.0* 30.8*  MCV 79.8* 79.4* 79.3* 80.4  PLT 260 271 224 937    Basic Metabolic Panel: Recent Labs  Lab 10/15/20 1041 10/16/20 0423 10/17/20 0525  NA 134* 135 137  K 4.2 4.2 4.1  CL 97* 100 104  CO2 25 22 26   GLUCOSE 124* 233* 125*  BUN 14 25* 29*  CREATININE 1.35* 1.34* 1.18  CALCIUM 8.5* 8.4* 8.6*    GFR: Estimated Creatinine Clearance: 80.8 mL/min (by C-G formula based on SCr of 1.18 mg/dL).  Liver Function Tests: Recent Labs  Lab 10/15/20 1041  AST 22  ALT 16  ALKPHOS 79  BILITOT 1.2  PROT 7.2  ALBUMIN 2.6*    CBG: Recent Labs  Lab 10/17/20 1210 10/17/20 1634 10/17/20 2210 10/18/20 0828 10/18/20 1140  GLUCAP 120* 121* 129* 87 91     Recent Results (from the past 240 hour(s))  Blood Culture (routine x 2)     Status: None (Preliminary result)   Collection Time: 10/15/20 10:41 AM   Specimen: BLOOD  Result Value Ref Range Status   Specimen Description BLOOD LAC  Final   Special Requests   Final    BOTTLES DRAWN AEROBIC AND ANAEROBIC Blood Culture adequate volume   Culture   Final    NO GROWTH 3 DAYS Performed at [facility], [address], [place], [place] [address]    Report Status PENDING  Incomplete  Blood Culture (routine x 2)     Status: None (Preliminary result)   Collection Time: 10/15/20 10:41 AM   Specimen: BLOOD  Result Value Ref Range Status   Specimen Description BLOOD RIGHT ARM  Final   Special Requests   Final    BOTTLES DRAWN AEROBIC AND ANAEROBIC Blood Culture results may not be optimal due to an excessive volume of blood received in culture bottles   Culture   Final    NO GROWTH 3 DAYS Performed at [facility], [address], [place], [place] [address]    Report Status  PENDING  Incomplete  Urine culture     Status: None   Collection Time: 10/15/20  1:40 PM   Specimen: Urine, Random  Result Value Ref Range Status   Specimen Description   Final    URINE, RANDOM Performed at [facility], [address], [place], [place] [address]    Special Requests   Final    NONE Performed at [facility], [address] [place], [place] [address]    Culture   Final    NO GROWTH Performed at [facility], [place] [address], [place], [place] [address]    Report Status 10/16/2020 FINAL  Final  Sputum culture     Status: None   Collection Time: 10/15/20  2:50 PM   Specimen: Sputum  Result Value Ref Range Status   Specimen Description SPUTUM  Final   Special Requests NONE  Final   Sputum evaluation   Final    THIS SPECIMEN IS ACCEPTABLE FOR SPUTUM CULTURE Performed at [facility], [address], [place], [place] [address]    Report Status 10/15/2020 FINAL  Final  Culture, respiratory     Status: None   Collection Time: 10/15/20  2:50 PM   Specimen: SPU  Result Value Ref Range Status   Specimen Description   Final    SPUTUM Performed at [facility], [address], [place], [place] [address]    Special Requests   Final    NONE Reflexed from [id number] Performed at [facility], [address] [place], [place] [address]    Gram Stain    Final    MODERATE WBC PRESENT, PREDOMINANTLY PMN FEW GRAM NEGATIVE RODS FEW GRAM POSITIVE COCCI IN PAIRS RARE GRAM POSITIVE RODS    Culture   Final    FEW Normal respiratory flora-no Staph aureus or Pseudomonas seen Performed at [facility], [place] [address] [place], [place] [address]    Report Status 10/18/2020 FINAL  Final  C Difficile Quick Screen w PCR reflex     Status: None   Collection Time: 10/15/20  2:58 PM   Specimen: Expectorated Sputum; Stool  Result Value Ref Range Status   C Diff antigen NEGATIVE NEGATIVE Final   C Diff toxin NEGATIVE NEGATIVE Final   C Diff interpretation No C. difficile detected.  Final    Comment: Performed at [facility], [address] [place],  [address]  Respiratory Panel by PCR     Status: None   Collection Time: 10/16/20  8:30 AM   Specimen: Nasopharyngeal Swab; Respiratory  Result Value Ref Range Status   Adenovirus NOT DETECTED NOT DETECTED Final   Coronavirus 229E NOT DETECTED NOT DETECTED Final    Comment: (NOTE) The Coronavirus on the Respiratory Panel, DOES NOT test for the novel  Coronavirus (2019 nCoV)    Coronavirus HKU1 NOT DETECTED NOT DETECTED Final   Coronavirus NL63 NOT DETECTED NOT DETECTED Final   Coronavirus OC43 NOT DETECTED NOT DETECTED Final   Metapneumovirus NOT DETECTED NOT DETECTED Final   Rhinovirus / Enterovirus NOT DETECTED NOT DETECTED Final   Influenza Jeshua [place] NOT DETECTED NOT DETECTED Final   Influenza B NOT DETECTED NOT DETECTED Final   Parainfluenza Virus 1 NOT DETECTED NOT DETECTED Final   Parainfluenza Virus 2 NOT DETECTED NOT DETECTED Final   Parainfluenza Virus 3 NOT DETECTED NOT DETECTED Final   Parainfluenza Virus 4 NOT DETECTED NOT DETECTED Final   Respiratory Syncytial Virus NOT DETECTED NOT DETECTED  Final   Bordetella pertussis NOT DETECTED NOT DETECTED Final   Chlamydophila pneumoniae NOT DETECTED NOT DETECTED Final   Mycoplasma pneumoniae NOT DETECTED NOT DETECTED Final     Comment: Performed at [facility], [place] [address], [place], [place] [address]  Expectorated sputum assessment w rflx to resp cult     Status: None   Collection Time: 10/16/20 11:17 AM   Specimen: Sputum  Result Value Ref Range Status   Specimen Description SPUTUM  Final   Special Requests NONE  Final   Sputum evaluation   Final    THIS SPECIMEN IS ACCEPTABLE FOR SPUTUM CULTURE Performed at [facility], [address] [place], [place] [address]    Report Status 10/16/2020 FINAL  Final  Culture, respiratory     Status: None (Preliminary result)   Collection Time: 10/16/20 11:17 AM   Specimen: SPU  Result Value Ref Range Status   Specimen Description   Final    SPUTUM Performed at [facility], [address], [place], [place] [address]    Special Requests   Final    NONE Reflexed from [id number] Performed at [facility], [address] [place], [place] [address]    Gram Stain   Final    FEW WBC PRESENT, PREDOMINANTLY MONONUCLEAR MODERATE GRAM POSITIVE COCCI FEW GRAM NEGATIVE RODS    Culture   Final    CULTURE REINCUBATED FOR BETTER GROWTH Performed at [facility], [place] [address] [place], [place] [address]    Report Status PENDING  Incomplete         Radiology Studies: DG Chest Port 1 View  Result Date: 10/18/2020 CLINICAL DATA:  64 year old male with history of pneumonia. EXAM: PORTABLE CHEST 1 VIEW COMPARISON:  Chest x-ray 10/01/2020. FINDINGS: Lung volumes are slightly low. There continues to be patchy ill-defined opacities and areas of interstitial prominence throughout the mid to lower lungs bilaterally, most compatible with multilobar pneumonia. Overall, there has been little change in aeration. No pleural effusions. No pneumothorax. No evidence of pulmonary edema. Heart size is normal. Upper mediastinal contours are within normal limits. IMPRESSION: 1. Overall, the radiographic appearance the chest is very similar to the  prior study with persistent evidence of multilobar bilateral pneumonia and/or developing post infectious fibrosis. Electronically Signed   By: Vinnie Langton M.D.   On: 10/18/2020 07:56        Scheduled Meds: . benzonatate  100 mg Oral TID  . citalopram  40 mg Oral Daily  . fluticasone  2 spray Each Nare Daily  . gabapentin  600 mg Oral BID  . HYDROcodone-homatropine  5 mL Oral TID  . insulin aspart  0-15 Units Subcutaneous TID WC  . insulin detemir  10 Units Subcutaneous Daily  . lidocaine  1 patch Transdermal Q24H  . linagliptin  5 mg Oral Daily  . pantoprazole  40 mg Oral Daily  . pravastatin  20 mg Oral QPM  . predniSONE  20 mg Oral Q breakfast   Continuous Infusions: . azithromycin Stopped (10/17/20 2349)  . cefTRIAXone (ROCEPHIN)  IV Stopped (10/17/20 2135)     LOS: 3 days    Time spent: over 30 min    Fayrene Helper, MD [facility]   To contact the attending provider between 7A-7P or the covering provider during after hours 7P-7A, please log into the web site [email or web address] and access using universal [place] password for that web site. If you do not have the password, please  call the hospital operator.  10/18/2020, 12:52 PM

## 2020-10-19 LAB — COMPREHENSIVE METABOLIC PANEL
ALT: 13 U/L (ref 0–44)
AST: 11 U/L — ABNORMAL LOW (ref 15–41)
Albumin: 2.4 g/dL — ABNORMAL LOW (ref 3.5–5.0)
Alkaline Phosphatase: 49 U/L (ref 38–126)
Anion gap: 6 (ref 5–15)
BUN: 15 mg/dL (ref 8–23)
CO2: 28 mmol/L (ref 22–32)
Calcium: 8.7 mg/dL — ABNORMAL LOW (ref 8.9–10.3)
Chloride: 104 mmol/L (ref 98–111)
Creatinine, Ser: 1 mg/dL (ref 0.61–1.24)
GFR, Estimated: >60 mL/min (ref >60)
Glucose, Bld: 140 mg/dL — ABNORMAL HIGH (ref 70–99)
Potassium: 4.6 mmol/L (ref 3.5–5.1)
Sodium: 138 mmol/L (ref 135–145)
Total Bilirubin: 0.5 mg/dL (ref 0.3–1.2)
Total Protein: 6 g/dL — ABNORMAL LOW (ref 6.5–8.1)

## 2020-10-19 LAB — CBC WITH DIFFERENTIAL/PLATELET
Abs Immature Granulocytes: 0.15 10*3/uL — ABNORMAL HIGH (ref 0.00–0.07)
Basophils Absolute: 0 10*3/uL (ref 0.0–0.1)
Basophils Relative: 0 %
Eosinophils Absolute: 0.1 10*3/uL (ref 0.0–0.5)
Eosinophils Relative: 2 %
HCT: 32 % — ABNORMAL LOW (ref 39.0–52.0)
Hemoglobin: 10.3 g/dL — ABNORMAL LOW (ref 13.0–17.0)
Immature Granulocytes: 2 %
Lymphocytes Relative: 20 %
Lymphs Abs: 1.5 10*3/uL (ref 0.7–4.0)
MCH: 25.6 pg — ABNORMAL LOW (ref 26.0–34.0)
MCHC: 32.2 g/dL (ref 30.0–36.0)
MCV: 79.6 fL — ABNORMAL LOW (ref 80.0–100.0)
Monocytes Absolute: 0.4 10*3/uL (ref 0.1–1.0)
Monocytes Relative: 5 %
Neutro Abs: 5.4 10*3/uL (ref 1.7–7.7)
Neutrophils Relative %: 71 %
Platelets: 238 10*3/uL (ref 150–400)
RBC: 4.02 MIL/uL — ABNORMAL LOW (ref 4.22–5.81)
RDW: 13.4 % (ref 11.5–15.5)
WBC: 7.6 10*3/uL (ref 4.0–10.5)
nRBC: 0 % (ref 0.0–0.2)

## 2020-10-19 LAB — CULTURE, RESPIRATORY W GRAM STAIN: Culture: NORMAL

## 2020-10-19 LAB — MAGNESIUM: Magnesium: 2.2 mg/dL (ref 1.7–2.4)

## 2020-10-19 LAB — PHOSPHORUS: Phosphorus: 3.2 mg/dL (ref 2.5–4.6)

## 2020-10-19 LAB — GLUCOSE, CAPILLARY: Glucose-Capillary: 102 mg/dL — ABNORMAL HIGH (ref 70–99)

## 2020-10-19 MED ORDER — FUROSEMIDE 40 MG PO TABS: ORAL_TABLET | ORAL | Status: DC

## 2020-10-19 MED ORDER — ISOSORBIDE MONONITRATE ER 30 MG PO TB24
ORAL_TABLET | ORAL | 4 refills | Status: DC
Start: 2020-10-19 — End: 2020-11-22

## 2020-10-19 MED ORDER — PREDNISONE 20 MG PO TABS: 20.0000 mg | ORAL_TABLET | Freq: Every day | ORAL | 0 refills | Status: AC

## 2020-10-19 MED ORDER — AMOXICILLIN-POT CLAVULANATE 875-125 MG PO TABS: 1.0000 | ORAL_TABLET | Freq: Two times a day (BID) | ORAL | 0 refills | Status: AC

## 2020-10-19 MED ORDER — HYDROCODONE-HOMATROPINE 5-1.5 MG/5ML PO SYRP
5.0000 mL | ORAL_SOLUTION | Freq: Four times a day (QID) | ORAL | 0 refills | Status: AC | PRN
Start: 2020-10-19 — End: 2020-10-26

## 2020-10-19 MED ORDER — GABAPENTIN 300 MG PO CAPS: 600.0000 mg | ORAL_CAPSULE | Freq: Every evening | ORAL | Status: DC | PRN

## 2020-10-19 MED ORDER — CLOPIDOGREL BISULFATE 75 MG PO TABS: 75.0000 mg | ORAL_TABLET | Freq: Every day | ORAL | Status: DC

## 2020-10-19 NOTE — TOC Initial Note (Signed)
Transition of Care [facility]) - Initial/Assessment Note    Patient Details  Name: Steven Vang MRN: [id number] Date of Birth: [birth year]  Transition of Care [facility]) CM/SW Contact:    Magnus Ivan, LCSW Phone Number: 10/19/2020, 3:30 PM  Clinical Narrative:                CSW met with patient at bedside. Patient lives with his wife who takes him to appointments. PCP is [facility] [facility]. [facility]. No DME, HH, or SNF history per patient. Discussed Outpatient PT recommendation. Patient agreeable to outpatient PT. List provided. Patient chose [facility]. CSW will fax referral form over. Patient denied additional needs at this time.   Expected Discharge Plan: OP Rehab Barriers to Discharge: Continued Medical Work up   Patient Goals and CMS Choice Patient states their goals for this hospitalization and ongoing recovery are:: home with outpatient pt CMS Medicare.gov Compare Post Acute Care list provided to:: Patient Choice offered to / list presented to : Patient  Expected Discharge Plan and Services Expected Discharge Plan: OP Rehab       Living arrangements for the past 2 months: Single Family Home Expected Discharge Date: 10/19/20                                    Prior Living Arrangements/Services Living arrangements for the past 2 months: Single Family Home Lives with:: Spouse Patient language and need for interpreter reviewed:: Yes Do you feel safe going back to the place where you live?: Yes      Need for Family Participation in Patient Care: Yes (Comment) Care giver support system in place?: Yes (comment)   Criminal Activity/Legal Involvement Pertinent to Current Situation/Hospitalization: No - Comment as needed  Activities of Daily Living Home Assistive Devices/Equipment: Eyeglasses ADL Screening (condition at time of admission) Patient's cognitive ability adequate to safely complete daily activities?: Yes Is the  patient deaf or have difficulty hearing?: No Does the patient have difficulty seeing, even when wearing glasses/contacts?: Yes Does the patient have difficulty concentrating, remembering, or making decisions?: Yes Patient able to express need for assistance with ADLs?: Yes Does the patient have difficulty dressing or bathing?: No Independently performs ADLs?: Yes (appropriate for developmental age) Does the patient have difficulty walking or climbing stairs?: No Weakness of Legs: None Weakness of Arms/Hands: None  Permission Sought/Granted Permission sought to share information with : Chartered certified accountant granted to share information with : Yes, Verbal Permission Granted     Permission granted to share info w AGENCY: outpatient PT        Emotional Assessment Appearance:: Appears stated age     Orientation: : Oriented to Situation, Oriented to  Time, Oriented to Place, Oriented to Self Alcohol / Substance Use: Not Applicable Psych Involvement: No (comment)  Admission diagnosis:  Hypoxia [R09.02] Healthcare-associated pneumonia [J18.9] HCAP (healthcare-associated pneumonia) [J18.9] Fever, unspecified fever cause [R50.9] COVID-19 [U07.1] Patient Active Problem List   Diagnosis Date Noted  . HCAP (healthcare-associated pneumonia) 10/15/2020  . Pneumonia due to COVID-19 virus 10/01/2020  . Obesity (BMI 30-39.9) 10/01/2020  . Acute respiratory failure due to COVID-19 ([place]) 10/01/2020  . AKI (acute kidney injury) ([place]) 09/27/2020  . History of tobacco use 06/23/2020  . Cough 06/23/2020  . Unstable angina ([place]) 05/19/2020  . Lipoma of head 03/31/2020  . Mild episode of recurrent major depressive disorder (  [place]) 01/05/2016  . Chronic systolic heart failure ([place]) 01/13/2015  . Coronary artery disease 01/13/2015  . Essential hypertension 01/13/2015  . Left ventricular dysfunction 01/13/2015  . Type 2 diabetes mellitus without complication, without long-term  current use of insulin ([place]) 01/13/2015  . Anxiety disorder 12/20/2014  . GERD (gastroesophageal reflux disease) 12/20/2014  . Mixed hyperlipidemia 12/20/2014  . Non morbid obesity due to excess calories 12/20/2014  . Depression 12/20/2014   PCP:  Eulogio Bear, NP [facility]   [id number] [place] [id number]: [phone]: [phone]    Social Determinants of Health (SDOH) Interventions    Readmission Risk Interventions No flowsheet data found.

## 2020-10-19 NOTE — Progress Notes (Signed)
Patient ready for discharge, avs given, iv removed. No questions comments or concerns at this time.

## 2020-10-19 NOTE — Discharge Instructions (Signed)
10 Things You Can Do to Manage Your COVID-19 Symptoms at Home If you have possible or confirmed COVID-19: 1. Stay home from work and school. And stay away from other public places. If you must go out, avoid using any kind of public transportation, ridesharing, or taxis. 2. Monitor your symptoms carefully. If your symptoms get worse, call your healthcare provider immediately. 3. Get rest and stay hydrated. 4. If you have a medical appointment, call the healthcare provider ahead of time and tell them that you have or may have COVID-19. 5. For medical emergencies, call 911 and notify the dispatch personnel that you have or may have COVID-19. 6. Cover your cough and sneezes with a tissue or use the inside of your elbow. 7. Wash your hands often with soap and water for at least 20 seconds or clean your hands with an alcohol-based hand sanitizer that contains at least 60% alcohol. 8. As much as possible, stay in a specific room and away from other people in your home. Also, you should use a separate bathroom, if available. If you need to be around other people in or outside of the home, wear a mask. 9. Avoid sharing personal items with other people in your household, like dishes, towels, and bedding. 10. Clean all surfaces that are touched often, like counters, tabletops, and doorknobs. Use household cleaning sprays or wipes according to the label instructions. cdc.gov/coronavirus 06/16/2019 This information is not intended to replace advice given to you by your health care provider. Make sure you discuss any questions you have with your health care provider. Document Revised: 11/18/2019 Document Reviewed: 11/18/2019 Elsevier Patient Education  2020 [employer]  

## 2020-10-19 NOTE — Plan of Care (Signed)

## 2020-10-19 NOTE — Progress Notes (Signed)
Pulmonary Medicine          Date: 10/19/2020,   MRN# [id number] Steven Vang 1956/04/14     AdmissionWeight: 70.3 kg                 CurrentWeight: 113.2 kg   Referring physician: Dr. Francine Graven   CHIEF COMPLAINT:   Acute on chronic hypoxemic respiratory failure with nonmassive hemoptysis   HISTORY OF PRESENT ILLNESS   64 year old pleasant male with a history as below in [place] section including significant history of CHF with reduced ejection fraction, type 2 diabetes, CAD status post remote COVID-19 pneumonia with acute hypoxemic respiratory failure requiring hospitalization and received IV remdesivir therapy as well as systemic glucocorticoid therapy with Decadron while hospitalized.  Patient was initially positive for COVID-19 September 22, 2020.  He came in due to cough with hemoptysis..  In the ER he was found to be febrile tachypneic tachycardic.  He had CT chest done with PE protocol which was negative for acute venous thromboembolism however did show right lower lobe consolidated pneumonia as well as patchy consolidations with interval progression from previously noted groundglass opacification in similar bilateral midlung zones as well as right lower lobe on October 13 study.  Blood work was done in the ED which was unrevealing except for mild elevation in creatinine.  Pulmonary consultation placed for acute on chronic hypoxemic respiratory failure with pneumonia with hemoptysis. Overnight patient reports continued blood streaked expectoration but only with clearing throat or cough.    10/17/20- patient continues to have blood streaked expectoration with cough. He complains of right flank pain and had mild costovertebral angle tenderness on right.  He is able to walk around hallway without assistance and reports no dyspnea or exertional symptoms such as lightheadedness/presyncope/cough/tachycardia.  He remains on room air with normoxia throughout interview this am.     10/18/20- patient further improved clinically. His CXR is improved more on right as illlustrated below. He is still coughing up phlegm and some blood streaking on expectoration. We discussed continuing current regimen.   10/19/20- patient is clinically improved, no overnight events on blood streaked cough, no O2 requirement this morning, patient is on room air and walking around freely.  He is cleared for d/c home with follow up to pulmonary clinic within 7d of d/c.  I will arrange follow up and have discussed with patient.  He may be dcd on Augmentin 875/125 po BID x 5 days and prednisone 11m x 5 days.  Please allow him to have IS and Flutter to take home.    PAST MEDICAL HISTORY   Past Medical History:  Diagnosis Date   Abnormal EKG    Anxiety disorder    CAD (coronary artery disease)    CHF (congestive heart failure) (HCC)    Chronic systolic heart failure (HCC)    Depression    Diabetes mellitus (HCC)    GERD (gastroesophageal reflux disease)    Hypertension    Lipoma of head 03/31/2020   Mixed hyperlipidemia    Non morbid obesity due to excess calories    Type 2 diabetes mellitus without complication, without long-term [place] use of insulin [place]      SURGICAL HISTORY   Past Surgical History:  Procedure Laterality Date   ANKLE FRACTURE SURGERY Right    CARDIAC CATHETERIZATION     CHOLECYSTECTOMY     CORONARY STENT PLACEMENT  2015   3 stents   HAND SURGERY Right    LEFT HEART  CATH AND CORONARY ANGIOGRAPHY Left 06/02/2020   Procedure: LEFT HEART CATH AND CORONARY ANGIOGRAPHY;  Surgeon: Dionisio David, MD;  Location: [facility];  Service: Cardiovascular;  Laterality: Left;     FAMILY HISTORY   Family History  Problem Relation Age of Onset   CAD Mother    Diabetes Mother    Colon cancer Father    Breast cancer Paternal Grandmother    Stomach cancer Paternal Grandfather      SOCIAL HISTORY   Social History   Tobacco Use    Smoking status: Former Smoker    Types: Cigarettes    Quit date: 1990    Years since quitting: 31.8   Smokeless tobacco: Never Used  Scientific laboratory technician Use: Never used  Substance Use Topics   Alcohol use: Not Currently   Drug use: Never     MEDICATIONS    Home Medication:    Current Medication:  Current Facility-Administered Medications:    azithromycin (ZITHROMAX) 500 mg in sodium chloride 0.9 % 250 mL IVPB, 500 mg, Intravenous, Q24H, Agbata, Tochukwu, MD, Stopped at 10/19/20 0229   benzonatate (TESSALON) capsule 100 mg, 100 mg, Oral, TID, Agbata, Tochukwu, MD, 100 mg at 10/19/20 0951   citalopram (CELEXA) tablet 40 mg, 40 mg, Oral, Daily, Agbata, Tochukwu, MD, 40 mg at 10/19/20 0951   fluticasone (FLONASE) 50 MCG/ACT nasal spray 2 spray, 2 spray, Each Nare, Daily, Agbata, Tochukwu, MD, 2 spray at 10/19/20 0950   gabapentin (NEURONTIN) capsule 600 mg, 600 mg, Oral, BID, Manuella Ghazi, Vipul, MD, 600 mg at 10/19/20 0951   guaiFENesin (ROBITUSSIN) 100 MG/5ML solution 100 mg, 5 mL, Oral, Q4H PRN, Agbata, Tochukwu, MD, 100 mg at 10/18/20 0419   HYDROcodone-homatropine (HYCODAN) 5-1.5 MG/5ML syrup 5 mL, 5 mL, Oral, TID, Manuella Ghazi, Vipul, MD, 5 mL at 10/19/20 0950   insulin aspart (novoLOG) injection 0-15 Units, 0-15 Units, Subcutaneous, TID WC, Agbata, Tochukwu, MD, 2 Units at 10/17/20 1652   insulin detemir (LEVEMIR) injection 8 Units, 8 Units, Subcutaneous, Daily, Elodia Florence., MD, 8 Units at 10/19/20 0950   lidocaine (LIDODERM) 5 % 1 patch, 1 patch, Transdermal, Q24H, Elodia Florence., MD, 1 patch at 10/19/20 0950   linagliptin (TRADJENTA) tablet 5 mg, 5 mg, Oral, Daily, Manuella Ghazi, Vipul, MD, 5 mg at 10/19/20 0951   loperamide (IMODIUM) capsule 2 mg, 2 mg, Oral, Q6H PRN, Max Sane, MD, 2 mg at 10/16/20 1605   pantoprazole (PROTONIX) EC tablet 40 mg, 40 mg, Oral, Daily, Agbata, Tochukwu, MD, 40 mg at 10/19/20 0951   pravastatin (PRAVACHOL) tablet 20 mg, 20 mg,  Oral, QPM, Agbata, Tochukwu, MD, 20 mg at 10/18/20 1652   predniSONE (DELTASONE) tablet 20 mg, 20 mg, Oral, Q breakfast, Lanney Gins, Yasuko Lapage, MD, 20 mg at 10/19/20 0951   traZODone (DESYREL) tablet 50 mg, 50 mg, Oral, QHS PRN, Agbata, Tochukwu, MD, 50 mg at 10/18/20 2116    ALLERGIES   Patient has no known allergies.     REVIEW OF SYSTEMS    Review of Systems:  Gen:  Denies  fever, sweats, chills weigh loss  HEENT: Denies blurred vision, double vision, ear pain, eye pain, hearing loss, nose bleeds, sore throat Cardiac:  No dizziness, chest pain or heaviness, chest tightness,edema Resp:   Denies cough or sputum porduction, shortness of breath,wheezing, hemoptysis,  Gi: Denies swallowing difficulty, stomach pain, nausea or vomiting, diarrhea, constipation, bowel incontinence Gu:  Denies bladder incontinence, burning urine Ext:   Denies Joint pain,  stiffness or swelling Skin: Denies  skin rash, easy bruising or bleeding or hives Endoc:  Denies polyuria, polydipsia , polyphagia or weight change Psych:   Denies depression, insomnia or hallucinations   Other:  All other systems negative   VS: BP 101/83 (BP Location: Right Arm)    Pulse 75    Temp 98.2 F (36.8 C) (Oral)    Resp 16    Ht 5' 10"  (1.778 m)    Wt 113.2 kg    SpO2 97%    BMI 35.81 kg/m      PHYSICAL EXAM    GENERAL:NAD, no fevers, chills, no weakness no fatigue HEAD: Normocephalic, atraumatic.  EYES: Pupils equal, round, reactive to light. Extraocular muscles intact. No scleral icterus.  MOUTH: Moist mucosal membrane. Dentition intact. No abscess noted.  EAR, NOSE, THROAT: Clear without exudates. No external lesions.  NECK: Supple. No thyromegaly. No nodules. No JVD.  PULMONARY: Diffuse coarse rhonchi right sided +wheezes CARDIOVASCULAR: S1 and S2. Regular rate and rhythm. No murmurs, rubs, or gallops. No edema. Pedal pulses 2+ bilaterally.  GASTROINTESTINAL: Soft, nontender, nondistended. No masses. Positive  bowel sounds. No hepatosplenomegaly.  MUSCULOSKELETAL: No swelling, clubbing, or edema. Range of motion full in all extremities.  NEUROLOGIC: Cranial nerves II through XII are intact. No gross focal neurological deficits. Sensation intact. Reflexes intact.  SKIN: No ulceration, lesions, rashes, or cyanosis. Skin warm and dry. Turgor intact.  PSYCHIATRIC: Mood, affect within normal limits. The patient is awake, alert and oriented x 3. Insight, judgment intact.       IMAGING       CT Angio Chest PE W and/or Wo Contrast  Result Date: 10/15/2020 CLINICAL DATA:  Suspect pulmonary embolus. High probability. Cough and hemoptysis. Diagnosed with Covid on 09/22/2020. Cough. EXAM: CT ANGIOGRAPHY CHEST WITH CONTRAST TECHNIQUE: Multidetector CT imaging of the chest was performed using the standard protocol during bolus administration of intravenous contrast. Multiplanar CT image reconstructions and MIPs were obtained to evaluate the vascular anatomy. CONTRAST:  141m OMNIPAQUE IOHEXOL 350 MG/ML SOLN COMPARISON:  09/27/2020 FINDINGS: Cardiovascular: Heart size is normal. Coronary stents and coronary artery calcifications are present. There is minimal atherosclerotic calcification of the thoracic aorta, not associated with aneurysm. The pulmonary arteries are moderately well opacified by contrast bolus. There is no acute pulmonary embolus. Mediastinum/Nodes: The visualized portion of the thyroid gland has a normal appearance. Mildly prominent mediastinal lymph nodes are present and are likely reactive, similar in appearance to the previous exam. Small bilateral hilar lymph nodes are present, less than 1 centimeter in short axis. The esophagus is unremarkable. No axillary adenopathy. Lungs/Pleura: There are focal, discrete areas of consolidation throughout the lungs with a peripheral distribution. The previously seen primarily ground-glass opacities have progressed to more consolidative appearance. This is most  remarkable in the RIGHT LOWER lobe where an area of patchy airspace filling opacity now appears as consolidation with air bronchograms. No pleural effusions. Large airways are patent. Upper Abdomen: No acute abnormality. Musculoskeletal: Mild degenerative changes in the midthoracic spine. Review of the MIP images confirms the above findings. IMPRESSION: 1. Technically adequate exam showing no acute pulmonary embolus. 2. Interval progression of bilateral pulmonary opacities, consistent with infectious/inflammatory process. 3. Coronary artery disease. 4. Aortic Atherosclerosis (ICD10-I70.0). Electronically Signed   By: ENolon NationsM.D.   On: 10/15/2020 12:18   CT Angio Chest PE W and/or Wo Contrast  Result Date: 09/27/2020 CLINICAL DATA:  Cough, shortness of breath.  COVID-19 positive EXAM: CT ANGIOGRAPHY CHEST WITH  CONTRAST TECHNIQUE: Multidetector CT imaging of the chest was performed using the standard protocol during bolus administration of intravenous contrast. Multiplanar CT image reconstructions and MIPs were obtained to evaluate the vascular anatomy. CONTRAST:  74m OMNIPAQUE IOHEXOL 350 MG/ML SOLN COMPARISON:  Same day chest x-ray FINDINGS: Cardiovascular: Satisfactory opacification of the pulmonary arteries to the segmental level. No evidence of pulmonary embolism. Thoracic aorta is nonaneurysmal. Minimal atherosclerotic calcification of the aortic arch. Coronary artery stent is noted. Normal heart size. No pericardial effusion. Mediastinum/Nodes: Mildly prominent subcarinal and right hilar lymph nodes. No axillary lymphadenopathy. Thyroid, trachea, and esophagus within normal limits. Lungs/Pleura: Patchy posterior right lower lobe airspace consolidation with surrounding ground-glass opacity. Additional multifocal areas of predominantly ground-glass opacity within the peripheral aspects of both lung fields. No pleural effusion. No pneumothorax. There are a few small scattered calcified granulomas  within the left upper lobe. Upper Abdomen: No acute abnormality. Musculoskeletal: No chest wall abnormality. No acute or significant osseous findings. Review of the MIP images confirms the above findings. IMPRESSION: 1. No evidence of pulmonary embolism. 2. Patchy posterior right lower lobe airspace consolidation. Additional multifocal areas of predominantly ground-glass opacity within the peripheral aspects of both lung fields. Findings are suspicious for multifocal pneumonia in the setting of COVID-19 infection. 3. Mildly prominent subcarinal and right hilar lymph nodes, likely reactive. Electronically Signed   By: NDavina PokeD.O.   On: 09/27/2020 13:27   DG Chest Port 1 View  Result Date: 10/18/2020 CLINICAL DATA:  64year old male with history of pneumonia. EXAM: PORTABLE CHEST 1 VIEW COMPARISON:  Chest x-ray 10/01/2020. FINDINGS: Lung volumes are slightly low. There continues to be patchy ill-defined opacities and areas of interstitial prominence throughout the mid to lower lungs bilaterally, most compatible with multilobar pneumonia. Overall, there has been little change in aeration. No pleural effusions. No pneumothorax. No evidence of pulmonary edema. Heart size is normal. Upper mediastinal contours are within normal limits. IMPRESSION: 1. Overall, the radiographic appearance the chest is very similar to the prior study with persistent evidence of multilobar bilateral pneumonia and/or developing post infectious fibrosis. Electronically Signed   By: DVinnie LangtonM.D.   On: 10/18/2020 07:56   DG Chest Portable 1 View  Result Date: 10/01/2020 CLINICAL DATA:  Shortness of.  COVID-19 positive EXAM: PORTABLE CHEST 1 VIEW COMPARISON:  September 29, 2020. FINDINGS: There is patchy opacity in the lung bases, similar to recent study. Lungs elsewhere clear. Heart is upper normal in size with pulmonary vascularity normal. No adenopathy. No bone lesions. IMPRESSION: Bibasilar airspace opacity consistent  with multifocal pneumonia. Suspect atypical organism etiology given the history and radiograph appearance. Heart size upper normal. No adenopathy. Electronically Signed   By: WLowella GripIII M.D.   On: 10/01/2020 12:26   DG Chest Port 1 View  Result Date: 09/29/2020 CLINICAL DATA:  Acute hypoxemic respiratory failure due to COVID 19 EXAM: PORTABLE CHEST 1 VIEW COMPARISON:  09/27/2020 FINDINGS: Mild patchy bibasilar airspace disease has progressed in the interval. Upper lobes appear clear. Heart size and vascularity normal. No effusion. IMPRESSION: Progression of patchy bibasilar airspace disease compatible with COVID pneumonia. Electronically Signed   By: CFranchot GalloM.D.   On: 09/29/2020 11:23   DG Chest Port 1 View  Result Date: 09/27/2020 CLINICAL DATA:  Possible sepsis. COVID positive with shortness of breath, body aches and cough. EXAM: PORTABLE CHEST 1 VIEW COMPARISON:  Chest radiograph 07/01/2014 FINDINGS: Low lung volumes, which limits evaluation. No focal consolidation. Cardiac silhouette is within  normal limits and mildly accentuated by low lung volumes and portable technique. No visible pleural effusions pneumothorax. No acute osseous abnormality. IMPRESSION: No evidence of acute cardiopulmonary disease on this AP portable radiograph with low lung volumes. Electronically Signed   By: Margaretha Sheffield MD   On: 09/27/2020 11:02            ASSESSMENT/PLAN   Acute on chronic hypoxemic respiratory failure -Due to bilateral pneumonia worse at the right lower lobe possible aspiration of blood during episodes of hemoptysis -Bilateral consolidated infiltrates of the mid lung zones with interval progression from previous study in October 13 as elucidated in pictorial documentation above  -We will rule out fungal pneumonia-Fungitell serum-in process -Procalcitonin trend for antimicrobial guidance-0.39>>>0.37 -Legionella, strep pneumoniae urinary antigen-negative -Mild elevation  lactic acid will continue to trend -Conservative fluid balance due to history of CHF currently with prerenal azotemia and mild AKI-+1200cc clinically improved with improved renal function -Expectorated sputum respiratory culture-moderate GPC recultured -Blood cultures x2 -Respiratory viral panel -negative -Caution using prophylactic anticoagulation for DVT -will consider bronchoscopic evaluation if hemoptysis does not resolve -Ferritin and ESR for background inflammatory changes- trending upward -continue prednisone 62m today - 10/18/20  Community-acquired pneumonia -due to gram positive cocci likely streptococal infection -Agree with empiric Rocephin and Zithromax status post x1 Maxipime -Agree with gabapentin and Robitussin for cough -will add Hycodan to suppress cough for now -continue current therapy   - GPC + in respiratory culture  Acute kidney injury stage I-resolved -Likely due to dehydration secondary to acutely ill status  Severe hyperglycemia-resolved -Due to stress response -Monitor sugars closely while acutely ill supplement with insulin sliding scale for now     Thank you for allowing me to participate in the care of this patient.   Patient/Family are satisfied with care plan and all questions have been answered.  This document was prepared using Dragon voice recognition software and may include unintentional dictation errors.     FOttie Glazier M.D.  Division of [place]

## 2020-10-19 NOTE — Discharge Summary (Signed)
Physician Discharge Summary   Steven Vang  male DOB: 11-15-1956  HER:[id number]  PCP: Eulogio Bear, NP  Admit date: 10/15/2020 Discharge date: 10/19/2020  Admitted From: home Disposition:  home CODE STATUS: Full code  Discharge Instructions    Discharge instructions   Complete by: As directed    You have finished 5 days of IV antibiotic for your pneumonia.  Please continue 5 more days of oral Augmentin as directed.  Also take prednisone 20 mg daily for 5 more days.  Please follow up with Dr. Lanney Gins about a week after discharge.   Dr. Enzo Bi [facility] Course:  For full details, please see H&P, progress notes, consult notes and ancillary notes.  Briefly,  Steven Vang is a 64 year old male with a known history of coronary disease, diabetes mellitus, hypertension, obesity, depression, anxiety and recent COVID 19 infection (positive 10/13) who was admitted for pneumonia with hemoptysis.  # Community-acquired pneumonia # Hemoptysis  Patient was recently hospitalized for COVID-19 pneumonia.  Presented with hemoptysis and persistent cough.  CT PE protocol without PE, progression of bilateral pulm opacities (10/31).  CXR 11/3 with multilobar bilateral pneumonia and/or developing post infectious fibrosis.  Pt received Rocephin and Zithromax while inpatient.  Pt also received solumedrol 125 mg x1.  Procalcitonin trendingdown, Urine Legionella,strep pneumo,Respiratory viral panelnegative, Sputum cx with normal resp flora.  Pulm consulted, who cleared pt to be discharged on 5 more days of oral Augmentin and prednisone 20 mg daily.  Hemoptysis resolved prior to discharge.  Pt will follow up with Pulm Dr. Lanney Gins 1 week after discharge.   Hypotension Likely due to infection. Home lasix and Imdur held until outpatient followup.  Coronary artery disease status post stenting Home aspirin and Plavix held due to hemoptysis, resumed at discharge.  Continued  statin  Chronic systolic CHF Well compensated at this time  Diabetes mellitus A1c 7.7 in Oct 2021.  Discharged back on home diabetic regimen.  Depression Continued trazodone and Celexa  GERD PPI  Obesity Body mass index is 35.81 kg/m.   Discharge Diagnoses:  Principal Problem:   HCAP (healthcare-associated pneumonia) Active Problems:   Chronic systolic heart failure (HCC)   Coronary artery disease   GERD (gastroesophageal reflux disease)   Type 2 diabetes mellitus without complication, without long-term current use of insulin ([place])   Depression    Discharge Instructions:  Allergies as of 10/19/2020   No Known Allergies     Medication List    STOP taking these medications   benzonatate 100 MG capsule Commonly known as: TESSALON   guaiFENesin-dextromethorphan 100-10 MG/5ML syrup Commonly known as: ROBITUSSIN DM   metoprolol succinate 50 MG 24 hr tablet Commonly known as: TOPROL-XL     TAKE these medications   amoxicillin-clavulanate 875-125 MG tablet Commonly known as: Augmentin Take 1 tablet by mouth 2 (two) times daily for 5 days. Antibiotic.   aspirin 81 MG EC tablet Take 1 tablet by mouth daily.   citalopram 40 MG tablet Commonly known as: CELEXA Take 1 tablet (40 mg total) by mouth daily.   clopidogrel 75 MG tablet Commonly known as: PLAVIX Take 1 tablet (75 mg total) by mouth at bedtime.   famotidine 20 MG tablet Commonly known as: PEPCID Take 1 tablet (20 mg total) by mouth 2 (two) times daily.   fluticasone 50 MCG/ACT nasal spray Commonly known as: FLONASE Place 2 sprays into both nostrils daily.   furosemide 40 MG tablet  Commonly known as: LASIX Hold until followup with your outpatient doctor because your blood pressure was normal without it and you had acute kidney injury. What changed:   how much to take  how to take this  when to take this  additional instructions   gabapentin 300 MG capsule Commonly known as:  NEURONTIN Take 2 capsules (600 mg total) by mouth at bedtime as needed.   HYDROcodone-homatropine 5-1.5 MG/5ML syrup Commonly known as: HYCODAN Take 5 mLs by mouth every 6 (six) hours as needed for up to 7 days for cough.   insulin aspart 100 UNIT/ML FlexPen Commonly known as: NOVOLOG Inject 5 Units into the skin 3 (three) times daily with meals. Add additional units per sliding scale provided.   insulin detemir 100 UNIT/ML FlexPen Commonly known as: LEVEMIR Inject 18 Units into the skin 2 (two) times daily.   isosorbide mononitrate 30 MG 24 hr tablet Commonly known as: IMDUR Hold until followup with your outpatient doctor because your blood pressure was low normal. What changed:   how much to take  how to take this  when to take this  additional instructions   pantoprazole 40 MG tablet Commonly known as: PROTONIX Take 1 tablet (40 mg total) by mouth daily.   pravastatin 80 MG tablet Commonly known as: PRAVACHOL Take 80 mg by mouth daily.   predniSONE 20 MG tablet Commonly known as: DELTASONE Take 1 tablet (20 mg total) by mouth daily with breakfast for 5 days. Start taking on: October 20, 2020   traZODone 50 MG tablet Commonly known as: DESYREL Take 1 tablet (50 mg total) by mouth at bedtime. PRN What changed: additional instructions        Follow-up Information    Ottie Glazier, MD. Schedule an appointment as soon as possible for a visit in 1 week(s).   Specialty: Pulmonary Disease Contact information: [place] [address] [phone]        Eulogio Bear, NP. Schedule an appointment as soon as possible for a visit in 1 week(s).   Specialty: Nurse Practitioner Contact information: [place] [address] [phone]               No Known Allergies   The results of significant diagnostics from this hospitalization (including imaging, microbiology, ancillary and laboratory) are listed below  for reference.   Consultations:   Procedures/Studies: CT Angio Chest PE W and/or Wo Contrast  Result Date: 10/15/2020 CLINICAL DATA:  Suspect pulmonary embolus. High probability. Cough and hemoptysis. Diagnosed with Covid on 09/22/2020. Cough. EXAM: CT ANGIOGRAPHY CHEST WITH CONTRAST TECHNIQUE: Multidetector CT imaging of the chest was performed using the standard protocol during bolus administration of intravenous contrast. Multiplanar CT image reconstructions and MIPs were obtained to evaluate the vascular anatomy. CONTRAST:  144mL OMNIPAQUE IOHEXOL 350 MG/ML SOLN COMPARISON:  09/27/2020 FINDINGS: Cardiovascular: Heart size is normal. Coronary stents and coronary artery calcifications are present. There is minimal atherosclerotic calcification of the thoracic aorta, not associated with aneurysm. The pulmonary arteries are moderately well opacified by contrast bolus. There is no acute pulmonary embolus. Mediastinum/Nodes: The visualized portion of the thyroid gland has a normal appearance. Mildly prominent mediastinal lymph nodes are present and are likely reactive, similar in appearance to the previous exam. Small bilateral hilar lymph nodes are present, less than 1 centimeter in short axis. The esophagus is unremarkable. No axillary adenopathy. Lungs/Pleura: There are focal, discrete areas of consolidation throughout the lungs with a peripheral distribution.  The previously seen primarily ground-glass opacities have progressed to more consolidative appearance. This is most remarkable in the RIGHT LOWER lobe where an area of patchy airspace filling opacity now appears as consolidation with air bronchograms. No pleural effusions. Large airways are patent. Upper Abdomen: No acute abnormality. Musculoskeletal: Mild degenerative changes in the midthoracic spine. Review of the MIP images confirms the above findings. IMPRESSION: 1. Technically adequate exam showing no acute pulmonary embolus. 2. Interval  progression of bilateral pulmonary opacities, consistent with infectious/inflammatory process. 3. Coronary artery disease. 4. Aortic Atherosclerosis (ICD10-I70.0). Electronically Signed   By: Nolon Nations M.D.   On: 10/15/2020 12:18   CT Angio Chest PE W and/or Wo Contrast  Result Date: 09/27/2020 CLINICAL DATA:  Cough, shortness of breath.  COVID-19 positive EXAM: CT ANGIOGRAPHY CHEST WITH CONTRAST TECHNIQUE: Multidetector CT imaging of the chest was performed using the standard protocol during bolus administration of intravenous contrast. Multiplanar CT image reconstructions and MIPs were obtained to evaluate the vascular anatomy. CONTRAST:  53mL OMNIPAQUE IOHEXOL 350 MG/ML SOLN COMPARISON:  Same day chest x-ray FINDINGS: Cardiovascular: Satisfactory opacification of the pulmonary arteries to the segmental level. No evidence of pulmonary embolism. Thoracic aorta is nonaneurysmal. Minimal atherosclerotic calcification of the aortic arch. Coronary artery stent is noted. Normal heart size. No pericardial effusion. Mediastinum/Nodes: Mildly prominent subcarinal and right hilar lymph nodes. No axillary lymphadenopathy. Thyroid, trachea, and esophagus within normal limits. Lungs/Pleura: Patchy posterior right lower lobe airspace consolidation with surrounding ground-glass opacity. Additional multifocal areas of predominantly ground-glass opacity within the peripheral aspects of both lung fields. No pleural effusion. No pneumothorax. There are a few small scattered calcified granulomas within the left upper lobe. Upper Abdomen: No acute abnormality. Musculoskeletal: No chest wall abnormality. No acute or significant osseous findings. Review of the MIP images confirms the above findings. IMPRESSION: 1. No evidence of pulmonary embolism. 2. Patchy posterior right lower lobe airspace consolidation. Additional multifocal areas of predominantly ground-glass opacity within the peripheral aspects of both lung fields.  Findings are suspicious for multifocal pneumonia in the setting of COVID-19 infection. 3. Mildly prominent subcarinal and right hilar lymph nodes, likely reactive. Electronically Signed   By: Davina Poke D.O.   On: 09/27/2020 13:27   DG Chest Port 1 View  Result Date: 10/18/2020 CLINICAL DATA:  64 year old male with history of pneumonia. EXAM: PORTABLE CHEST 1 VIEW COMPARISON:  Chest x-ray 10/01/2020. FINDINGS: Lung volumes are slightly low. There continues to be patchy ill-defined opacities and areas of interstitial prominence throughout the mid to lower lungs bilaterally, most compatible with multilobar pneumonia. Overall, there has been little change in aeration. No pleural effusions. No pneumothorax. No evidence of pulmonary edema. Heart size is normal. Upper mediastinal contours are within normal limits. IMPRESSION: 1. Overall, the radiographic appearance the chest is very similar to the prior study with persistent evidence of multilobar bilateral pneumonia and/or developing post infectious fibrosis. Electronically Signed   By: Vinnie Langton M.D.   On: 10/18/2020 07:56   DG Chest Portable 1 View  Result Date: 10/01/2020 CLINICAL DATA:  Shortness of.  COVID-19 positive EXAM: PORTABLE CHEST 1 VIEW COMPARISON:  September 29, 2020. FINDINGS: There is patchy opacity in the lung bases, similar to recent study. Lungs elsewhere clear. Heart is upper normal in size with pulmonary vascularity normal. No adenopathy. No bone lesions. IMPRESSION: Bibasilar airspace opacity consistent with multifocal pneumonia. Suspect atypical organism etiology given the history and radiograph appearance. Heart size upper normal. No adenopathy. Electronically Signed   By:  Lowella Grip III M.D.   On: 10/01/2020 12:26   DG Chest Port 1 View  Result Date: 09/29/2020 CLINICAL DATA:  Acute hypoxemic respiratory failure due to COVID 19 EXAM: PORTABLE CHEST 1 VIEW COMPARISON:  09/27/2020 FINDINGS: Mild patchy bibasilar  airspace disease has progressed in the interval. Upper lobes appear clear. Heart size and vascularity normal. No effusion. IMPRESSION: Progression of patchy bibasilar airspace disease compatible with COVID pneumonia. Electronically Signed   By: Franchot Gallo M.D.   On: 09/29/2020 11:23   DG Chest Port 1 View  Result Date: 09/27/2020 CLINICAL DATA:  Possible sepsis. COVID positive with shortness of breath, body aches and cough. EXAM: PORTABLE CHEST 1 VIEW COMPARISON:  Chest radiograph 07/01/2014 FINDINGS: Low lung volumes, which limits evaluation. No focal consolidation. Cardiac silhouette is within normal limits and mildly accentuated by low lung volumes and portable technique. No visible pleural effusions pneumothorax. No acute osseous abnormality. IMPRESSION: No evidence of acute cardiopulmonary disease on this AP portable radiograph with low lung volumes. Electronically Signed   By: Margaretha Sheffield MD   On: 09/27/2020 11:02      Labs: BNP (last 3 results) Recent Labs    09/27/20 1041  BNP 57.3   Basic Metabolic Panel: Recent Labs  Lab 10/15/20 1041 10/16/20 0423 10/17/20 0525 10/19/20 0604  NA 134* 135 137 138  K 4.2 4.2 4.1 4.6  CL 97* 100 104 104  CO2 25 22 26 28   GLUCOSE 124* 233* 125* 140*  BUN 14 25* 29* 15  CREATININE 1.35* 1.34* 1.18 1.00  CALCIUM 8.5* 8.4* 8.6* 8.7*  MG  --   --   --  2.2  PHOS  --   --   --  3.2   Liver Function Tests: Recent Labs  Lab 10/15/20 1041 10/19/20 0604  AST 22 11*  ALT 16 13  ALKPHOS 79 49  BILITOT 1.2 0.5  PROT 7.2 6.0*  ALBUMIN 2.6* 2.4*   No results for input(s): LIPASE, AMYLASE in the last 168 hours. No results for input(s): AMMONIA in the last 168 hours. CBC: Recent Labs  Lab 10/15/20 1041 10/16/20 0423 10/17/20 0525 10/18/20 0526 10/19/20 0604  WBC 10.1 9.3 9.0 6.5 7.6  NEUTROABS 8.8*  --   --   --  5.4  HGB 10.2* 9.6* 9.2* 9.9* 10.3*  HCT 32.0* 30.1* 28.0* 30.8* 32.0*  MCV 79.8* 79.4* 79.3* 80.4 79.6*  PLT  260 271 224 233 238   Cardiac Enzymes: No results for input(s): CKTOTAL, CKMB, CKMBINDEX, TROPONINI in the last 168 hours. BNP: Invalid input(s): POCBNP CBG: Recent Labs  Lab 10/18/20 0828 10/18/20 1140 10/18/20 1752 10/18/20 2208 10/19/20 0824  GLUCAP 87 91 169* 266* 102*   D-Dimer No results for input(s): DDIMER in the last 72 hours. Hgb A1c No results for input(s): HGBA1C in the last 72 hours. Lipid Profile No results for input(s): CHOL, HDL, LDLCALC, TRIG, CHOLHDL, LDLDIRECT in the last 72 hours. Thyroid function studies No results for input(s): TSH, T4TOTAL, T3FREE, THYROIDAB in the last 72 hours.  Invalid input(s): FREET3 Anemia work up No results for input(s): VITAMINB12, FOLATE, FERRITIN, TIBC, IRON, RETICCTPCT in the last 72 hours. Urinalysis    Component Value Date/Time   COLORURINE YELLOW (A) 10/15/2020 1340   APPEARANCEUR CLEAR (A) 10/15/2020 1340   LABSPEC >1.046 (H) 10/15/2020 1340   PHURINE 5.0 10/15/2020 [place] 10/15/2020 [place] 10/15/2020 [place] 10/15/2020 1340   KETONESUR  20 (A) 10/15/2020 1340   PROTEINUR 30 (A) 10/15/2020 1340   NITRITE NEGATIVE 10/15/2020 1340   LEUKOCYTESUR NEGATIVE 10/15/2020 1340   Sepsis Labs Invalid input(s): PROCALCITONIN,  WBC,  LACTICIDVEN Microbiology Recent Results (from the past 240 hour(s))  Blood Culture (routine x 2)     Status: None (Preliminary result)   Collection Time: 10/15/20 10:41 AM   Specimen: BLOOD  Result Value Ref Range Status   Specimen Description BLOOD LAC  Final   Special Requests   Final    BOTTLES DRAWN AEROBIC AND ANAEROBIC Blood Culture adequate volume   Culture   Final    NO GROWTH 4 DAYS Performed at [facility], [address], [place], [place] [address]    Report Status PENDING  Incomplete  Blood Culture (routine x 2)     Status: None (Preliminary result)   Collection Time: 10/15/20 10:41 AM   Specimen: BLOOD  Result  Value Ref Range Status   Specimen Description BLOOD RIGHT ARM  Final   Special Requests   Final    BOTTLES DRAWN AEROBIC AND ANAEROBIC Blood Culture results may not be optimal due to an excessive volume of blood received in culture bottles   Culture   Final    NO GROWTH 4 DAYS Performed at [facility], [address], [place], [place] [address]    Report Status PENDING  Incomplete  Urine culture     Status: None   Collection Time: 10/15/20  1:40 PM   Specimen: Urine, Random  Result Value Ref Range Status   Specimen Description   Final    URINE, RANDOM Performed at [facility], [address] [place], [place] [address]    Special Requests   Final    NONE Performed at [facility], [address] [place], [place] [address]    Culture   Final    NO GROWTH Performed at [facility], [place] [address], [place], [place] [address]    Report Status 10/16/2020 FINAL  Final  Sputum culture     Status: None   Collection Time: 10/15/20  2:50 PM   Specimen: Sputum  Result Value Ref Range Status   Specimen Description SPUTUM  Final   Special Requests NONE  Final   Sputum evaluation   Final    THIS SPECIMEN IS ACCEPTABLE FOR SPUTUM CULTURE Performed at [facility], [address] [place], [place] [address]    Report Status 10/15/2020 FINAL  Final  Culture, respiratory     Status: None   Collection Time: 10/15/20  2:50 PM   Specimen: SPU  Result Value Ref Range Status   Specimen Description   Final    SPUTUM Performed at [facility], [address], [place], [place] [address]    Special Requests   Final    NONE Reflexed from [id number] Performed at [facility], [address] [place], [place] [address]    Gram Stain   Final    MODERATE WBC PRESENT, PREDOMINANTLY PMN FEW GRAM NEGATIVE RODS FEW GRAM POSITIVE COCCI IN PAIRS RARE GRAM POSITIVE RODS    Culture   Final    FEW Normal respiratory flora-no Staph aureus  or Pseudomonas seen Performed at [facility], [place] [address] [place],  [address]    Report Status 10/18/2020 FINAL  Final  C Difficile Quick Screen w PCR reflex     Status: None   Collection Time: 10/15/20  2:58 PM  Specimen: Expectorated Sputum; Stool  Result Value Ref Range Status   C Diff antigen NEGATIVE NEGATIVE Final   C Diff toxin NEGATIVE NEGATIVE Final   C Diff interpretation No C. difficile detected.  Final    Comment: Performed at [facility], [address] [place], [place] [address]  Respiratory Panel by PCR     Status: None   Collection Time: 10/16/20  8:30 AM   Specimen: Nasopharyngeal Swab; Respiratory  Result Value Ref Range Status   Adenovirus NOT DETECTED NOT DETECTED Final   Coronavirus 229E NOT DETECTED NOT DETECTED Final    Comment: (NOTE) The Coronavirus on the Respiratory Panel, DOES NOT test for the novel  Coronavirus (2019 nCoV)    Coronavirus HKU1 NOT DETECTED NOT DETECTED Final   Coronavirus NL63 NOT DETECTED NOT DETECTED Final   Coronavirus OC43 NOT DETECTED NOT DETECTED Final   Metapneumovirus NOT DETECTED NOT DETECTED Final   Rhinovirus / Enterovirus NOT DETECTED NOT DETECTED Final   Influenza A NOT DETECTED NOT DETECTED Final   Influenza B NOT DETECTED NOT DETECTED Final   Parainfluenza Virus 1 NOT DETECTED NOT DETECTED Final   Parainfluenza Virus 2 NOT DETECTED NOT DETECTED Final   Parainfluenza Virus 3 NOT DETECTED NOT DETECTED Final   Parainfluenza Virus 4 NOT DETECTED NOT DETECTED Final   Respiratory Syncytial Virus NOT DETECTED NOT DETECTED Final   Bordetella pertussis NOT DETECTED NOT DETECTED Final   Chlamydophila pneumoniae NOT DETECTED NOT DETECTED Final   Mycoplasma pneumoniae NOT DETECTED NOT DETECTED Final    Comment: Performed at [facility], [place]. [address] [place], [place] [address]  Expectorated sputum assessment w rflx to resp cult     Status: None   Collection Time: 10/16/20 11:17 AM    Specimen: Sputum  Result Value Ref Range Status   Specimen Description SPUTUM  Final   Special Requests NONE  Final   Sputum evaluation   Final    THIS SPECIMEN IS ACCEPTABLE FOR SPUTUM CULTURE Performed at [facility], [address] [place], [place] [address]    Report Status 10/16/2020 FINAL  Final  Culture, respiratory     Status: None   Collection Time: 10/16/20 11:17 AM   Specimen: SPU  Result Value Ref Range Status   Specimen Description   Final    SPUTUM Performed at [facility], [address] [place], [place] [address]    Special Requests   Final    NONE Reflexed from [id number] Performed at [facility], [address] [place], [place] [address]    Gram Stain   Final    FEW WBC PRESENT, PREDOMINANTLY MONONUCLEAR MODERATE GRAM POSITIVE COCCI FEW GRAM NEGATIVE RODS    Culture   Final    FEW Normal respiratory flora-no Staph aureus or Pseudomonas seen Performed at [facility], [place] [address], [place], [place] [address]    Report Status 10/19/2020 FINAL  Final     Total time spend on discharging this patient, including the last patient exam, discussing the hospital stay, instructions for ongoing care as it relates to all pertinent caregivers, as well as preparing the medical discharge records, prescriptions, and/or referrals as applicable, is 45 minutes.    Enzo Bi, MD  [facility] 10/19/2020, 2:46 PM  If 7PM-7AM, please contact night-coverage

## 2020-10-20 LAB — CULTURE, BLOOD (ROUTINE X 2)
Culture: NO GROWTH
Culture: NO GROWTH
Special Requests: ADEQUATE

## 2020-10-23 ENCOUNTER — Telehealth: Payer: Self-pay

## 2020-10-23 NOTE — Telephone Encounter (Signed)
Called to schedule hosp f/u, no answer, left vm

## 2020-10-24 ENCOUNTER — Telehealth (INDEPENDENT_AMBULATORY_CARE_PROVIDER_SITE_OTHER): Payer: 59 | Admitting: Nurse Practitioner

## 2020-10-24 ENCOUNTER — Other Ambulatory Visit: Payer: Self-pay

## 2020-10-24 ENCOUNTER — Encounter: Payer: Self-pay | Admitting: Nurse Practitioner

## 2020-10-24 VITALS — BP 126/85 | Wt 240.0 lb

## 2020-10-24 DIAGNOSIS — J1282 Pneumonia due to coronavirus disease 2019: Secondary | ICD-10-CM

## 2020-10-24 DIAGNOSIS — I700 Atherosclerosis of aorta: Secondary | ICD-10-CM | POA: Insufficient documentation

## 2020-10-24 DIAGNOSIS — U071 COVID-19: Secondary | ICD-10-CM

## 2020-10-24 DIAGNOSIS — E119 Type 2 diabetes mellitus without complications: Secondary | ICD-10-CM | POA: Diagnosis not present

## 2020-10-24 DIAGNOSIS — Z794 Long term (current) use of insulin: Secondary | ICD-10-CM

## 2020-10-24 MED ORDER — OZEMPIC (0.25 OR 0.5 MG/DOSE) 2 MG/1.5ML ~~LOC~~ SOPN: 0.2500 mg | PEN_INJECTOR | SUBCUTANEOUS | 0 refills | Status: DC

## 2020-10-24 NOTE — Assessment & Plan Note (Addendum)
Found on CT chest October 2021-with known coronary artery disease and follows with cardiology.  Continue pravastatin 80 mg for now.

## 2020-10-24 NOTE — Patient Instructions (Signed)
Semaglutide injection solution What is this medicine? SEMAGLUTIDE (Sem a GLOO tide) is used to improve blood sugar control in adults with type 2 diabetes. This medicine may be used with other diabetes medicines. This drug may also reduce the risk of heart attack or stroke if you have type 2 diabetes and risk factors for heart disease. This medicine may be used for other purposes; ask your health care provider or pharmacist if you have questions. COMMON BRAND NAME(S): OZEMPIC What should I tell my health care provider before I take this medicine? They need to know if you have any of these conditions:  endocrine tumors (MEN 2) or if someone in your family had these tumors  eye disease, vision problems  history of pancreatitis  kidney disease  stomach problems  thyroid cancer or if someone in your family had thyroid cancer  an unusual or allergic reaction to semaglutide, other medicines, foods, dyes, or preservatives  pregnant or trying to get pregnant  breast-feeding How should I use this medicine? This medicine is for injection under the skin of your upper leg (thigh), stomach area, or upper arm. It is given once every week (every 7 days). You will be taught how to prepare and give this medicine. Use exactly as directed. Take your medicine at regular intervals. Do not take it more often than directed. If you use this medicine with insulin, you should inject this medicine and the insulin separately. Do not mix them together. Do not give the injections right next to each other. Change (rotate) injection sites with each injection. It is important that you put your used needles and syringes in a special sharps container. Do not put them in a trash can. If you do not have a sharps container, call your pharmacist or healthcare provider to get one. A special MedGuide will be given to you by the pharmacist with each prescription and refill. Be sure to read this information carefully each  time. This drug comes with INSTRUCTIONS FOR USE. Ask your pharmacist for directions on how to use this drug. Read the information carefully. Talk to your pharmacist or health care provider if you have questions. Talk to your pediatrician regarding the use of this medicine in children. Special care may be needed. Overdosage: If you think you have taken too much of this medicine contact a poison control center or emergency room at once. NOTE: This medicine is only for you. Do not share this medicine with others. What if I miss a dose? If you miss a dose, take it as soon as you can within 5 days after the missed dose. Then take your next dose at your regular weekly time. If it has been longer than 5 days after the missed dose, do not take the missed dose. Take the next dose at your regular time. Do not take double or extra doses. If you have questions about a missed dose, contact your health care provider for advice. What may interact with this medicine?  other medicines for diabetes Many medications may cause changes in blood sugar, these include:  alcohol containing beverages  antiviral medicines for HIV or AIDS  aspirin and aspirin-like drugs  certain medicines for blood pressure, heart disease, irregular heart beat  chromium  diuretics  male hormones, such as estrogens or progestins, birth control pills  fenofibrate  gemfibrozil  isoniazid  lanreotide  male hormones or anabolic steroids  MAOIs like Carbex, Eldepryl, Marplan, Nardil, and Parnate  medicines for weight loss  medicines for   allergies, asthma, cold, or cough  medicines for depression, anxiety, or psychotic disturbances  niacin  nicotine  NSAIDs, medicines for pain and inflammation, like ibuprofen or naproxen  octreotide  pasireotide  pentamidine  phenytoin  probenecid  quinolone antibiotics such as ciprofloxacin, levofloxacin, ofloxacin  some herbal dietary supplements  steroid medicines  such as prednisone or cortisone  sulfamethoxazole; trimethoprim  thyroid hormones Some medications can hide the warning symptoms of low blood sugar (hypoglycemia). You may need to monitor your blood sugar more closely if you are taking one of these medications. These include:  beta-blockers, often used for high blood pressure or heart problems (examples include atenolol, metoprolol, propranolol)  clonidine  guanethidine  reserpine This list may not describe all possible interactions. Give your health care provider a list of all the medicines, herbs, non-prescription drugs, or dietary supplements you use. Also tell them if you smoke, drink alcohol, or use illegal drugs. Some items may interact with your medicine. What should I watch for while using this medicine? Visit your doctor or health care professional for regular checks on your progress. Drink plenty of fluids while taking this medicine. Check with your doctor or health care professional if you get an attack of severe diarrhea, nausea, and vomiting. The loss of too much body fluid can make it dangerous for you to take this medicine. A test called the HbA1C (A1C) will be monitored. This is a simple blood test. It measures your blood sugar control over the last 2 to 3 months. You will receive this test every 3 to 6 months. Learn how to check your blood sugar. Learn the symptoms of low and high blood sugar and how to manage them. Always carry a quick-source of sugar with you in case you have symptoms of low blood sugar. Examples include hard sugar candy or glucose tablets. Make sure others know that you can choke if you eat or drink when you develop serious symptoms of low blood sugar, such as seizures or unconsciousness. They must get medical help at once. Tell your doctor or health care professional if you have high blood sugar. You might need to change the dose of your medicine. If you are sick or exercising more than usual, you might need  to change the dose of your medicine. Do not skip meals. Ask your doctor or health care professional if you should avoid alcohol. Many nonprescription cough and cold products contain sugar or alcohol. These can affect blood sugar. Pens should never be shared. Even if the needle is changed, sharing may result in passing of viruses like hepatitis or HIV. Wear a medical ID bracelet or chain, and carry a card that describes your disease and details of your medicine and dosage times. Do not become pregnant while taking this medicine. Women should inform their doctor if they wish to become pregnant or think they might be pregnant. There is a potential for serious side effects to an unborn child. Talk to your health care professional or pharmacist for more information. What side effects may I notice from receiving this medicine? Side effects that you should report to your doctor or health care professional as soon as possible:  allergic reactions like skin rash, itching or hives, swelling of the face, lips, or tongue  breathing problems  changes in vision  diarrhea that continues or is severe  lump or swelling on the neck  severe nausea  signs and symptoms of infection like fever or chills; cough; sore throat; pain or trouble   passing urine  signs and symptoms of low blood sugar such as feeling anxious, confusion, dizziness, increased hunger, unusually weak or tired, sweating, shakiness, cold, irritable, headache, blurred vision, fast heartbeat, loss of consciousness  signs and symptoms of kidney injury like trouble passing urine or change in the amount of urine  trouble swallowing  unusual stomach upset or pain  vomiting Side effects that usually do not require medical attention (report to your doctor or health care professional if they continue or are bothersome):  constipation  diarrhea  nausea  pain, redness, or irritation at site where injected  stomach upset This list may not  describe all possible side effects. Call your doctor for medical advice about side effects. You may report side effects to FDA at 1-800-FDA-1088. Where should I keep my medicine? Keep out of the reach of children. Store unopened pens in a refrigerator between 2 and 8 degrees C (36 and 46 degrees F). Do not freeze. Protect from light and heat. After you first use the pen, it can be stored for 56 days at room temperature between 15 and 30 degrees C (59 and 86 degrees F) or in a refrigerator. Throw away your used pen after 56 days or after the expiration date, whichever comes first. Do not store your pen with the needle attached. If the needle is left on, medicine may leak from the pen. NOTE: This sheet is a summary. It may not cover all possible information. If you have questions about this medicine, talk to your doctor, pharmacist, or health care provider.  2020 Elsevier/Gold Standard (2019-08-17 09:41:51)  

## 2020-10-24 NOTE — Progress Notes (Signed)
BP 126/85    Wt 240 lb (108.9 kg)    BMI 34.44 kg/m    Subjective:    Patient ID: Steven Vang, male    DOB: 20-Aug-1956, 64 y.o.   MRN: [id number]  HPI: Steven Vang is a 64 y.o. male presenting for hospital follow up.  Chief Complaint  Patient presents with   Hospitalization Follow-up    pt states he was in the hospital for Pneumonia for a few days. States he was seen by pulmonary Dr. Wilburn Mylar.    HOSPITAL FOLLOW UP Time since discharge: 10/19/2020 ~ 5 days Hospital/facility: [facility] Diagnosis: hemoptysis, COVID pneumonia Procedures/tests:  - Chest x-ray:  Persistent multilobar bilateral pneumonia and/or post infectious fibrosis. - CT chest: no acute pulmonary embolus, bilateral pulmonary opacities, coronary artery disease, aortic atherosclerosis  Consultants: pulmonology New medications: insulin Discharge instructions:  Finish course of antibiotics for pneumonia, follow up with pulmonary, take prednisone for 5 more days. Status: better   Had appointment with lung doctor yesterday - it is going to take some time to feel all the way better from the pneumonia.  Has appointment December 19, 2020 with him for follow up.  DIABETES Feels like Metformin made him tired, noticed this in the past year or so.  Was put on insulin in the hospital because he was on steroids.  He was sent home on insulin.  Is wondering if this is something he will have to continue or if there is something else we can try.  He is tired of being poked so much. Hypoglycemic episodes:no Polydipsia/polyuria: no Visual disturbance: no Chest pain: no Paresthesias: no Glucose Monitoring: yes  Accucheck frequency: Daily  Fasting glucose: 90s-110s  Evening: ~150 Taking Insulin?: yes  Long acting insulin: 18 units twice daily  Short acting insulin: 5 units with meals Blood Pressure Monitoring: daily  120/68 Retinal Examination: Not up to Date Foot Exam: Not up to Date Diabetic Education:  Completed Pneumovax: Up to Date Influenza: Not up to Date Aspirin: yes   No Known Allergies  Outpatient Encounter Medications as of 10/24/2020  Medication Sig Note   amoxicillin-clavulanate (AUGMENTIN) 875-125 MG tablet Take 1 tablet by mouth 2 (two) times daily for 5 days. Antibiotic.    aspirin 81 MG EC tablet Take 1 tablet by mouth daily.    citalopram (CELEXA) 40 MG tablet Take 1 tablet (40 mg total) by mouth daily.    clopidogrel (PLAVIX) 75 MG tablet Take 1 tablet (75 mg total) by mouth at bedtime.    famotidine (PEPCID) 20 MG tablet Take 1 tablet (20 mg total) by mouth 2 (two) times daily.    fluticasone (FLONASE) 50 MCG/ACT nasal spray Place 2 sprays into both nostrils daily.    furosemide (LASIX) 40 MG tablet Hold until followup with your outpatient doctor because your blood pressure was normal without it and you had acute kidney injury.    gabapentin (NEURONTIN) 300 MG capsule Take 2 capsules (600 mg total) by mouth at bedtime as needed.    HYDROcodone-homatropine (HYCODAN) 5-1.5 MG/5ML syrup Take 5 mLs by mouth every 6 (six) hours as needed for up to 7 days for cough.    insulin detemir (LEVEMIR) 100 UNIT/ML FlexPen Inject 18 Units into the skin 2 (two) times daily.    isosorbide mononitrate (IMDUR) 30 MG 24 hr tablet Hold until followup with your outpatient doctor because your blood pressure was low normal.    pantoprazole (PROTONIX) 40 MG tablet Take 1 tablet (40 mg total)  by mouth daily.    pravastatin (PRAVACHOL) 80 MG tablet Take 80 mg by mouth daily. 10/15/2020: Patient takes 40 mg daily at bedtime   predniSONE (DELTASONE) 20 MG tablet Take 1 tablet (20 mg total) by mouth daily with breakfast for 5 days.    traZODone (DESYREL) 50 MG tablet Take 1 tablet (50 mg total) by mouth at bedtime. PRN (Patient taking differently: Take 50 mg by mouth at bedtime. )    [DISCONTINUED] insulin aspart (NOVOLOG) 100 UNIT/ML FlexPen Inject 5 Units into the skin 3 (three) times  daily with meals. Add additional units per sliding scale provided. 10/15/2020: Patient takes 3 units three times daily with meals plus sliding scale    No facility-administered encounter medications on file as of 10/24/2020.   Patient Active Problem List   Diagnosis Date Noted   Aortic atherosclerosis ([place]) 10/24/2020   HCAP (healthcare-associated pneumonia) 10/15/2020   Pneumonia due to COVID-19 virus 10/01/2020   Obesity (BMI 30-39.9) 10/01/2020   Acute respiratory failure due to COVID-19 ([place]) 10/01/2020   AKI (acute kidney injury) ([place]) 09/27/2020   History of tobacco use 06/23/2020   Cough 06/23/2020   Unstable angina (HCC) 05/19/2020   Lipoma of head 03/31/2020   Mild episode of recurrent major depressive disorder ([place]) 10/93/2355   Chronic systolic heart failure ([place]) 01/13/2015   Coronary artery disease 01/13/2015   Essential hypertension 01/13/2015   Left ventricular dysfunction 01/13/2015   Type 2 diabetes mellitus treated with insulin ([place]) 01/13/2015   Anxiety disorder 12/20/2014   GERD (gastroesophageal reflux disease) 12/20/2014   Mixed hyperlipidemia 12/20/2014   Non morbid obesity due to excess calories 12/20/2014   Depression 12/20/2014   Past Medical History:  Diagnosis Date   Abnormal EKG    Anxiety disorder    CAD (coronary artery disease)    CHF (congestive heart failure) (HCC)    Chronic systolic heart failure (HCC)    Depression    Diabetes mellitus (HCC)    GERD (gastroesophageal reflux disease)    Hypertension    Lipoma of head 03/31/2020   Mixed hyperlipidemia    Non morbid obesity due to excess calories    Type 2 diabetes mellitus without complication, without long-term current use of insulin (HCC)    Relevant past medical, surgical, family and social history reviewed and updated as indicated. Interim medical history since our last visit reviewed.  Review of Systems  Constitutional: Negative.   Respiratory:  Positive for cough. Negative for shortness of breath and wheezing.   Cardiovascular: Negative.  Negative for chest pain and palpitations.  Gastrointestinal: Negative.   Endocrine: Negative.   Skin: Negative.   Neurological: Negative.   Psychiatric/Behavioral: Negative.     Per HPI unless specifically indicated above     Objective:    BP 126/85    Wt 240 lb (108.9 kg)    BMI 34.44 kg/m   Wt Readings from Last 3 Encounters:  10/24/20 240 lb (108.9 kg)  10/15/20 249 lb 9.6 oz (113.2 kg)  10/01/20 260 lb (117.9 kg)    Physical Exam Constitutional:      General: He is not in acute distress.    Appearance: Normal appearance. He is not toxic-appearing.  HENT:     Head: Normocephalic and atraumatic.     Right Ear: External ear normal.     Left Ear: External ear normal.  Eyes:     General: No scleral icterus.    Extraocular Movements: Extraocular movements intact.  Cardiovascular:  Comments: Unable to assess heart sounds via virtual visit. Pulmonary:     Effort: Pulmonary effort is normal. No respiratory distress.     Comments: Unable to assess lung sounds via virtual visit.  Patient talking in complete sentences.  No accessory muscle use. Abdominal:     Comments: Unable to assess bowel sounds via virtual visit  Skin:    Coloration: Skin is not jaundiced or pale.     Findings: No erythema.  Neurological:     Mental Status: He is alert and oriented to person, place, and time.  Psychiatric:        Mood and Affect: Mood normal.        Behavior: Behavior normal.        Thought Content: Thought content normal.        Judgment: Judgment normal.       Assessment & Plan:   Problem List Items Addressed This Visit      Cardiovascular and Mediastinum   Aortic atherosclerosis ([place])    Found on CT chest October 2021-with known coronary artery disease and follows with cardiology.  Continue pravastatin 80 mg for now.        Respiratory   Pneumonia due to COVID-19 virus -  Primary    Ongoing.  Near end of prednisone and antibiotics for pneumonia and reports doing better each day.  Continue collaboration with pulmonology.  Next appointment with pulmonology in January.        Endocrine   Type 2 diabetes mellitus treated with insulin (HCC)    Chronic, ongoing.  With recent hospitalization, treated with mealtime insulin along with twice daily long-acting insulin.  Given blood sugars have consistently been less than 120 fasting, will discontinue mealtime insulin and start once weekly Ozempic 0.25 mg subcutaneously.  Will continue long acting insulin for now, to hold insulin if blood sugar less than 150.  Follow-up in 4 weeks or sooner with any questions or concerns.  May increase Ozempic to 0.5 mg after 4 weeks.  Due for A1c in January.          Follow up plan: Return in about 4 weeks (around 11/21/2020) for DM/Ozempic follow up.

## 2020-10-24 NOTE — Assessment & Plan Note (Signed)
Ongoing.  Near end of prednisone and antibiotics for pneumonia and reports doing better each day.  Continue collaboration with pulmonology.  Next appointment with pulmonology in January.

## 2020-10-24 NOTE — Assessment & Plan Note (Signed)
Chronic, ongoing.  With recent hospitalization, treated with mealtime insulin along with twice daily long-acting insulin.  Given blood sugars have consistently been less than 120 fasting, will discontinue mealtime insulin and start once weekly Ozempic 0.25 mg subcutaneously.  Will continue long acting insulin for now, to hold insulin if blood sugar less than 150.  Follow-up in 4 weeks or sooner with any questions or concerns.  May increase Ozempic to 0.5 mg after 4 weeks.  Due for A1c in January.

## 2020-10-30 ENCOUNTER — Telehealth: Payer: Self-pay

## 2020-10-30 ENCOUNTER — Telehealth: Payer: 59 | Admitting: Nurse Practitioner

## 2020-10-30 NOTE — Telephone Encounter (Signed)
Eulogio Bear, NP  P [facility] Admin Please reach out to patient to schedule hospital follow up.        Lvm to make this apt per PCP.

## 2020-11-16 ENCOUNTER — Other Ambulatory Visit: Payer: Self-pay

## 2020-11-16 DIAGNOSIS — K219 Gastro-esophageal reflux disease without esophagitis: Secondary | ICD-10-CM

## 2020-11-16 MED ORDER — FAMOTIDINE 20 MG PO TABS: 20.0000 mg | ORAL_TABLET | Freq: Two times a day (BID) | ORAL | 0 refills | Status: DC

## 2020-11-16 MED ORDER — PANTOPRAZOLE SODIUM 40 MG PO TBEC: 40.0000 mg | DELAYED_RELEASE_TABLET | Freq: Every day | ORAL | 0 refills | Status: DC

## 2020-11-19 ENCOUNTER — Encounter: Payer: Self-pay | Admitting: Nurse Practitioner

## 2020-11-22 ENCOUNTER — Encounter: Payer: Self-pay | Admitting: Nurse Practitioner

## 2020-11-22 ENCOUNTER — Ambulatory Visit (INDEPENDENT_AMBULATORY_CARE_PROVIDER_SITE_OTHER): Payer: 59 | Admitting: Nurse Practitioner

## 2020-11-22 ENCOUNTER — Other Ambulatory Visit: Payer: Self-pay

## 2020-11-22 VITALS — BP 112/72 | HR 77 | Temp 98.2°F | Ht 70.2 in | Wt 254.8 lb

## 2020-11-22 DIAGNOSIS — U071 COVID-19: Secondary | ICD-10-CM

## 2020-11-22 DIAGNOSIS — R040 Epistaxis: Secondary | ICD-10-CM

## 2020-11-22 DIAGNOSIS — J1282 Pneumonia due to coronavirus disease 2019: Secondary | ICD-10-CM

## 2020-11-22 DIAGNOSIS — Z23 Encounter for immunization: Secondary | ICD-10-CM | POA: Diagnosis not present

## 2020-11-22 MED ORDER — ISOSORBIDE MONONITRATE ER 30 MG PO TB24: ORAL_TABLET | ORAL | 4 refills | Status: DC

## 2020-11-22 NOTE — Patient Instructions (Signed)
NEXT WEEK CONTINUE 0.25 MG OZEMPIC AND THEN THE WEEK AFTER INCREASE TO 0.5 MG WEEKLY.  Semaglutide injection solution What is this medicine? SEMAGLUTIDE (Sem a GLOO tide) is used to improve blood sugar control in adults with type 2 diabetes. This medicine may be used with other diabetes medicines. This drug may also reduce the risk of heart attack or stroke if you have type 2 diabetes and risk factors for heart disease. This medicine may be used for other purposes; ask your health care provider or pharmacist if you have questions. COMMON BRAND NAME(S): OZEMPIC What should I tell my health care provider before I take this medicine? They need to know if you have any of these conditions:  endocrine tumors (MEN 2) or if someone in your family had these tumors  eye disease, vision problems  history of pancreatitis  kidney disease  stomach problems  thyroid cancer or if someone in your family had thyroid cancer  an unusual or allergic reaction to semaglutide, other medicines, foods, dyes, or preservatives  pregnant or trying to get pregnant  breast-feeding How should I use this medicine? This medicine is for injection under the skin of your upper leg (thigh), stomach area, or upper arm. It is given once every week (every 7 days). You will be taught how to prepare and give this medicine. Use exactly as directed. Take your medicine at regular intervals. Do not take it more often than directed. If you use this medicine with insulin, you should inject this medicine and the insulin separately. Do not mix them together. Do not give the injections right next to each other. Change (rotate) injection sites with each injection. It is important that you put your used needles and syringes in a special sharps container. Do not put them in a trash can. If you do not have a sharps container, call your pharmacist or healthcare provider to get one. A special MedGuide will be given to you by the pharmacist  with each prescription and refill. Be sure to read this information carefully each time. This drug comes with INSTRUCTIONS FOR USE. Ask your pharmacist for directions on how to use this drug. Read the information carefully. Talk to your pharmacist or health care provider if you have questions. Talk to your pediatrician regarding the use of this medicine in children. Special care may be needed. Overdosage: If you think you have taken too much of this medicine contact a poison control center or emergency room at once. NOTE: This medicine is only for you. Do not share this medicine with others. What if I miss a dose? If you miss a dose, take it as soon as you can within 5 days after the missed dose. Then take your next dose at your regular weekly time. If it has been longer than 5 days after the missed dose, do not take the missed dose. Take the next dose at your regular time. Do not take double or extra doses. If you have questions about a missed dose, contact your health care provider for advice. What may interact with this medicine?  other medicines for diabetes Many medications may cause changes in blood sugar, these include:  alcohol containing beverages  antiviral medicines for HIV or AIDS  aspirin and aspirin-like drugs  certain medicines for blood pressure, heart disease, irregular heart beat  chromium  diuretics  male hormones, such as estrogens or progestins, birth control pills  fenofibrate  gemfibrozil  isoniazid  lanreotide  male hormones or anabolic steroids  MAOIs like Carbex, Eldepryl, Marplan, Nardil, and Parnate  medicines for weight loss  medicines for allergies, asthma, cold, or cough  medicines for depression, anxiety, or psychotic disturbances  niacin  nicotine  NSAIDs, medicines for pain and inflammation, like ibuprofen or naproxen  octreotide  pasireotide  pentamidine  phenytoin  probenecid  quinolone antibiotics such as  ciprofloxacin, levofloxacin, ofloxacin  some herbal dietary supplements  steroid medicines such as prednisone or cortisone  sulfamethoxazole; trimethoprim  thyroid hormones Some medications can hide the warning symptoms of low blood sugar (hypoglycemia). You may need to monitor your blood sugar more closely if you are taking one of these medications. These include:  beta-blockers, often used for high blood pressure or heart problems (examples include atenolol, metoprolol, propranolol)  clonidine  guanethidine  reserpine This list may not describe all possible interactions. Give your health care provider a list of all the medicines, herbs, non-prescription drugs, or dietary supplements you use. Also tell them if you smoke, drink alcohol, or use illegal drugs. Some items may interact with your medicine. What should I watch for while using this medicine? Visit your doctor or health care professional for regular checks on your progress. Drink plenty of fluids while taking this medicine. Check with your doctor or health care professional if you get an attack of severe diarrhea, nausea, and vomiting. The loss of too much body fluid can make it dangerous for you to take this medicine. A test called the HbA1C (A1C) will be monitored. This is a simple blood test. It measures your blood sugar control over the last 2 to 3 months. You will receive this test every 3 to 6 months. Learn how to check your blood sugar. Learn the symptoms of low and high blood sugar and how to manage them. Always carry a quick-source of sugar with you in case you have symptoms of low blood sugar. Examples include hard sugar candy or glucose tablets. Make sure others know that you can choke if you eat or drink when you develop serious symptoms of low blood sugar, such as seizures or unconsciousness. They must get medical help at once. Tell your doctor or health care professional if you have high blood sugar. You might need to  change the dose of your medicine. If you are sick or exercising more than usual, you might need to change the dose of your medicine. Do not skip meals. Ask your doctor or health care professional if you should avoid alcohol. Many nonprescription cough and cold products contain sugar or alcohol. These can affect blood sugar. Pens should never be shared. Even if the needle is changed, sharing may result in passing of viruses like hepatitis or HIV. Wear a medical ID bracelet or chain, and carry a card that describes your disease and details of your medicine and dosage times. Do not become pregnant while taking this medicine. Women should inform their doctor if they wish to become pregnant or think they might be pregnant. There is a potential for serious side effects to an unborn child. Talk to your health care professional or pharmacist for more information. What side effects may I notice from receiving this medicine? Side effects that you should report to your doctor or health care professional as soon as possible:  allergic reactions like skin rash, itching or hives, swelling of the face, lips, or tongue  breathing problems  changes in vision  diarrhea that continues or is severe  lump or swelling on the neck  severe nausea  signs and symptoms of infection like fever or chills; cough; sore throat; pain or trouble passing urine  signs and symptoms of low blood sugar such as feeling anxious, confusion, dizziness, increased hunger, unusually weak or tired, sweating, shakiness, cold, irritable, headache, blurred vision, fast heartbeat, loss of consciousness  signs and symptoms of kidney injury like trouble passing urine or change in the amount of urine  trouble swallowing  unusual stomach upset or pain  vomiting Side effects that usually do not require medical attention (report to your doctor or health care professional if they continue or are  bothersome):  constipation  diarrhea  nausea  pain, redness, or irritation at site where injected  stomach upset This list may not describe all possible side effects. Call your doctor for medical advice about side effects. You may report side effects to FDA at 1-800-FDA-1088. Where should I keep my medicine? Keep out of the reach of children. Store unopened pens in a refrigerator between 2 and 8 degrees C (36 and 46 degrees F). Do not freeze. Protect from light and heat. After you first use the pen, it can be stored for 56 days at room temperature between 15 and 30 degrees C (59 and 86 degrees F) or in a refrigerator. Throw away your used pen after 56 days or after the expiration date, whichever comes first. Do not store your pen with the needle attached. If the needle is left on, medicine may leak from the pen. NOTE: This sheet is a summary. It may not cover all possible information. If you have questions about this medicine, talk to your doctor, pharmacist, or health care provider.  2020 [employer] (2019-08-17 09:41:51)

## 2020-11-22 NOTE — Progress Notes (Signed)
BP 112/72   Pulse 77   Temp 98.2 F (36.8 C) (Oral)   Ht 5' 10.2" (1.783 m)   Wt 254 lb 12.8 oz (115.6 kg)   SpO2 98%   BMI 36.36 kg/m    Subjective:    Patient ID: Steven Vang, male    DOB: 12/09/1956, 64 y.o.   MRN: [id number]  HPI: Steven Vang is a 64 y.o. male  Chief Complaint  Patient presents with  . nose bleeding    since last month  . follow up covid   COVID PNEUMONIA Covid [facility] admission on 10/15/20.  Is followed Dr. Lanney Gins, pulmonary, and last saw 10/23/20 and had repeat CXR 10/31/20, unable to view in Epic.  Patient reports he was told he still has a little pneumonia in right lung.  Continues to reports loss of taste and smell.  Reports nose bleeds continue -- notices it if blows nose now.  Has not had a bleed in 2 days, was worse 2-3 weeks ago.  Does endorse memory changes and fatigue since Covid, which he reports pulmonary told him could take 6 months to return.   Had not been vaccinated.   Fever: no Cough: a little bit, but improving Shortness of breath: a little bit Wheezing: no Chest pain: no Chest tightness: no Chest congestion: no Nasal congestion: no Runny nose: yes Post nasal drip: no Sneezing: no Sore throat: no Swollen glands: no Sinus pressure: no Headache: yes Face pain: no Toothache: no Ear pain: none Ear pressure: none Eyes red/itching:no Eye drainage/crusting: no  Vomiting: no Rash: no Fatigue: yes   Relevant past medical, surgical, family and social history reviewed and updated as indicated. Interim medical history since our last visit reviewed. Allergies and medications reviewed and updated.  Review of Systems  Constitutional: Positive for fatigue. Negative for activity change, appetite change, diaphoresis and fever.  Respiratory: Positive for cough and shortness of breath. Negative for chest tightness and wheezing.   Cardiovascular: Negative for chest pain, palpitations and leg swelling.  Gastrointestinal: Negative.    Endocrine: Negative for cold intolerance, heat intolerance, polydipsia, polyphagia and polyuria.  Neurological: Negative.     Per HPI unless specifically indicated above     Objective:    BP 112/72   Pulse 77   Temp 98.2 F (36.8 C) (Oral)   Ht 5' 10.2" (1.783 m)   Wt 254 lb 12.8 oz (115.6 kg)   SpO2 98%   BMI 36.36 kg/m   Wt Readings from Last 3 Encounters:  11/22/20 254 lb 12.8 oz (115.6 kg)  10/24/20 240 lb (108.9 kg)  10/15/20 249 lb 9.6 oz (113.2 kg)    Physical Exam Vitals and nursing note reviewed.  Constitutional:      General: He is awake. He is not in acute distress.    Appearance: He is well-developed and well-groomed. He is obese. He is not ill-appearing.  HENT:     Head: Normocephalic and atraumatic.     Right Ear: Hearing normal. No drainage.     Left Ear: Hearing normal. No drainage.  Eyes:     General: Lids are normal.        Right eye: No discharge.        Left eye: No discharge.     Conjunctiva/sclera: Conjunctivae normal.     Pupils: Pupils are equal, round, and reactive to light.  Neck:     Thyroid: No thyromegaly.     Vascular: No carotid bruit.  Trachea: Trachea normal.  Cardiovascular:     Rate and Rhythm: Normal rate and regular rhythm.     Heart sounds: Normal heart sounds, S1 normal and S2 normal. No murmur heard.  No gallop.   Pulmonary:     Effort: Pulmonary effort is normal. No accessory muscle usage or respiratory distress.     Breath sounds: Normal breath sounds.  Abdominal:     General: Bowel sounds are normal.     Palpations: Abdomen is soft. There is no hepatomegaly or splenomegaly.  Musculoskeletal:        General: Normal range of motion.     Cervical back: Normal range of motion and neck supple.     Right lower leg: No edema.     Left lower leg: No edema.  Skin:    General: Skin is warm and dry.     Capillary Refill: Capillary refill takes less than 2 seconds.  Neurological:     Mental Status: He is alert and  oriented to person, place, and time.     Deep Tendon Reflexes: Reflexes are normal and symmetric.  Psychiatric:        Attention and Perception: Attention normal.        Mood and Affect: Mood normal.        Speech: Speech normal.        Behavior: Behavior normal. Behavior is cooperative.        Thought Content: Thought content normal.     Results for orders placed or performed during the hospital encounter of 10/15/20  Urine culture   Specimen: Urine, Random  Result Value Ref Range   Specimen Description      URINE, RANDOM Performed at [facility], [address] [place], [place] [address]    Special Requests      NONE Performed at [facility], [address], [place], [place] [address]    Culture      NO GROWTH Performed at [facility], [place] [address], [place], [place] [address]    Report Status 10/16/2020 FINAL   Blood Culture (routine x 2)   Specimen: BLOOD  Result Value Ref Range   Specimen Description BLOOD LAC    Special Requests      BOTTLES DRAWN AEROBIC AND ANAEROBIC Blood Culture adequate volume   Culture      NO GROWTH 5 DAYS Performed at [facility], [address] [place], [place] [address]    Report Status 10/20/2020 FINAL   Blood Culture (routine x 2)   Specimen: BLOOD  Result Value Ref Range   Specimen Description BLOOD RIGHT ARM    Special Requests      BOTTLES DRAWN AEROBIC AND ANAEROBIC Blood Culture results may not be optimal due to an excessive volume of blood received in culture bottles   Culture      NO GROWTH 5 DAYS Performed at [facility], [address] [place], [place] [address]    Report Status 10/20/2020 FINAL   Sputum culture   Specimen: Sputum  Result Value Ref Range   Specimen Description SPUTUM    Special Requests NONE    Sputum evaluation      THIS SPECIMEN IS ACCEPTABLE FOR SPUTUM CULTURE Performed at [facility], [address] [place], [place] [address]     Report Status 10/15/2020 FINAL   C Difficile Quick Screen w PCR reflex   Specimen: Expectorated Sputum; Stool  Result Value Ref Range   C  Diff antigen NEGATIVE NEGATIVE   C Diff toxin NEGATIVE NEGATIVE   C Diff interpretation No C. difficile detected.   Culture, respiratory   Specimen: SPU  Result Value Ref Range   Specimen Description      SPUTUM Performed at [facility], [address] [place], [place] [address]    Special Requests      NONE Reflexed from [phone] Performed at [facility], [address] [place], [place] [address]    Gram Stain      MODERATE WBC PRESENT, PREDOMINANTLY PMN FEW GRAM NEGATIVE RODS FEW GRAM POSITIVE COCCI IN PAIRS RARE GRAM POSITIVE RODS    Culture      FEW Normal respiratory flora-no Staph aureus or Pseudomonas seen Performed at [facility], [place] [address] [place], [place] [address]    Report Status 10/18/2020 FINAL   Expectorated sputum assessment w rflx to resp cult   Specimen: Sputum  Result Value Ref Range   Specimen Description SPUTUM    Special Requests NONE    Sputum evaluation      THIS SPECIMEN IS ACCEPTABLE FOR SPUTUM CULTURE Performed at [facility], [address] [place], [place] [address]    Report Status 10/16/2020 FINAL   Respiratory Panel by PCR   Specimen: Nasopharyngeal Swab; Respiratory  Result Value Ref Range   Adenovirus NOT DETECTED NOT DETECTED   Coronavirus 229E NOT DETECTED NOT DETECTED   Coronavirus HKU1 NOT DETECTED NOT DETECTED   Coronavirus NL63 NOT DETECTED NOT DETECTED   Coronavirus OC43 NOT DETECTED NOT DETECTED   Metapneumovirus NOT DETECTED NOT DETECTED   Rhinovirus / Enterovirus NOT DETECTED NOT DETECTED   Influenza A NOT DETECTED NOT DETECTED   Influenza B NOT DETECTED NOT DETECTED   Parainfluenza Virus 1 NOT DETECTED NOT DETECTED   Parainfluenza Virus 2 NOT DETECTED NOT DETECTED   Parainfluenza Virus 3 NOT DETECTED NOT DETECTED   Parainfluenza Virus 4  NOT DETECTED NOT DETECTED   Respiratory Syncytial Virus NOT DETECTED NOT DETECTED   Bordetella pertussis NOT DETECTED NOT DETECTED   Chlamydophila pneumoniae NOT DETECTED NOT DETECTED   Mycoplasma pneumoniae NOT DETECTED NOT DETECTED  Culture, respiratory   Specimen: SPU  Result Value Ref Range   Specimen Description      SPUTUM Performed at [facility], [address], [place], [place] [address]    Special Requests      NONE Reflexed from [phone] Performed at [facility], [address] [place], [place] [address]    Gram Stain      FEW WBC PRESENT, PREDOMINANTLY MONONUCLEAR MODERATE GRAM POSITIVE COCCI FEW GRAM NEGATIVE RODS    Culture      FEW Normal respiratory flora-no Staph aureus or Pseudomonas seen Performed at [facility], [place] [address], [place], [place] [address]    Report Status 10/19/2020 FINAL   CBC with Differential  Result Value Ref Range   WBC 10.1 4.0 - 10.5 K/uL   RBC 4.01 (L) 4.22 - 5.81 MIL/uL   Hemoglobin 10.2 (L) 13.0 - 17.0 g/dL   HCT 32.0 (L) 39 - 52 %   MCV 79.8 (L) 80.0 - 100.0 fL   MCH 25.4 (L) 26.0 - 34.0 pg   MCHC 31.9 30.0 - 36.0 g/dL   RDW 13.2 11.5 - 15.5 %   Platelets 260 150 - 400 K/uL   nRBC 0.0 0.0 - 0.2 %   Neutrophils Relative % 86 %   Neutro Abs 8.8 (  H) 1.7 - 7.7 K/uL   Lymphocytes Relative 7 %   Lymphs Abs 0.7 0.7 - 4.0 K/uL   Monocytes Relative 5 %   Monocytes Absolute 0.5 0.1 - 1.0 K/uL   Eosinophils Relative 0 %   Eosinophils Absolute 0.0 0.0 - 0.5 K/uL   Basophils Relative 1 %   Basophils Absolute 0.1 0.0 - 0.1 K/uL   Immature Granulocytes 1 %   Abs Immature Granulocytes 0.07 0.00 - 0.07 K/uL  Comprehensive metabolic panel  Result Value Ref Range   Sodium 134 (L) 135 - 145 mmol/L   Potassium 4.2 3.5 - 5.1 mmol/L   Chloride 97 (L) 98 - 111 mmol/L   CO2 25 22 - 32 mmol/L   Glucose, Bld 124 (H) 70 - 99 mg/dL   BUN 14 8 - 23 mg/dL   Creatinine, Ser 1.35 (H) 0.61 - 1.24 mg/dL   Calcium 8.5  (L) 8.9 - 10.3 mg/dL   Total Protein 7.2 6.5 - 8.1 g/dL   Albumin 2.6 (L) 3.5 - 5.0 g/dL   AST 22 15 - 41 U/L   ALT 16 0 - 44 U/L   Alkaline Phosphatase 79 38 - 126 U/L   Total Bilirubin 1.2 0.3 - 1.2 mg/dL   GFR, Estimated 59 (L) >60 mL/min   Anion gap 12 5 - 15  Lactic acid, plasma  Result Value Ref Range   Lactic Acid, Venous 1.8 0.5 - 1.9 mmol/L  Protime-INR  Result Value Ref Range   Prothrombin Time 15.4 (H) 11.4 - 15.2 seconds   INR 1.3 (H) 0.8 - 1.2  Urinalysis, Complete w Microscopic Urine, Clean Catch  Result Value Ref Range   Color, Urine YELLOW (A) YELLOW   APPearance CLEAR (A) CLEAR   Specific Gravity, Urine >1.046 (H) 1.005 - 1.030   pH 5.0 5.0 - 8.0   Glucose, UA NEGATIVE NEGATIVE mg/dL   Hgb urine dipstick NEGATIVE NEGATIVE   Bilirubin Urine NEGATIVE NEGATIVE   Ketones, ur 20 (A) NEGATIVE mg/dL   Protein, ur 30 (A) NEGATIVE mg/dL   Nitrite NEGATIVE NEGATIVE   Leukocytes,Ua NEGATIVE NEGATIVE   RBC / HPF 0-5 0 - 5 RBC/hpf   WBC, UA 0-5 0 - 5 WBC/hpf   Bacteria, UA RARE (A) NONE SEEN   Squamous Epithelial / LPF 0-5 0 - 5  Procalcitonin - Baseline  Result Value Ref Range   Procalcitonin 0.39 ng/mL  HIV Antibody (routine testing w rflx)  Result Value Ref Range   HIV Screen 4th Generation wRfx Non Reactive Non Reactive  Procalcitonin  Result Value Ref Range   Procalcitonin 0.37 ng/mL  Basic metabolic panel  Result Value Ref Range   Sodium 135 135 - 145 mmol/L   Potassium 4.2 3.5 - 5.1 mmol/L   Chloride 100 98 - 111 mmol/L   CO2 22 22 - 32 mmol/L   Glucose, Bld 233 (H) 70 - 99 mg/dL   BUN 25 (H) 8 - 23 mg/dL   Creatinine, Ser 1.34 (H) 0.61 - 1.24 mg/dL   Calcium 8.4 (L) 8.9 - 10.3 mg/dL   GFR, Estimated 60 (L) >60 mL/min   Anion gap 13 5 - 15  CBC  Result Value Ref Range   WBC 9.3 4.0 - 10.5 K/uL   RBC 3.79 (L) 4.22 - 5.81 MIL/uL   Hemoglobin 9.6 (L) 13.0 - 17.0 g/dL   HCT 30.1 (L) 39 - 52 %   MCV 79.4 (L) 80.0 - 100.0 fL   MCH 25.3 (L)  26.0 - 34.0  pg   MCHC 31.9 30.0 - 36.0 g/dL   RDW 13.2 11.5 - 15.5 %   Platelets 271 150 - 400 K/uL   nRBC 0.0 0.0 - 0.2 %  Glucose, capillary  Result Value Ref Range   Glucose-Capillary 222 (H) 70 - 99 mg/dL  Glucose, capillary  Result Value Ref Range   Glucose-Capillary 215 (H) 70 - 99 mg/dL  Glucose, capillary  Result Value Ref Range   Glucose-Capillary 223 (H) 70 - 99 mg/dL  Fungitell, Serum  Result Value Ref Range   Fungitell Result Comment <80 pg/mL  Strep pneumoniae urinary antigen  Result Value Ref Range   Strep Pneumo Urinary Antigen NEGATIVE NEGATIVE  Legionella Pneumophila Serogp 1 Ur Ag  Result Value Ref Range   L. pneumophila Serogp 1 Ur Ag Negative Negative   Source of Sample URINE, RANDOM   Sedimentation rate  Result Value Ref Range   Sed Rate >140 (H) 0 - 20 mm/hr  Ferritin  Result Value Ref Range   Ferritin 914 (H) 24 - 336 ng/mL  Glucose, capillary  Result Value Ref Range   Glucose-Capillary 239 (H) 70 - 99 mg/dL  Glucose, capillary  Result Value Ref Range   Glucose-Capillary 256 (H) 70 - 99 mg/dL  Glucose, capillary  Result Value Ref Range   Glucose-Capillary 226 (H) 70 - 99 mg/dL  Procalcitonin  Result Value Ref Range   Procalcitonin 0.24 ng/mL  CBC  Result Value Ref Range   WBC 9.0 4.0 - 10.5 K/uL   RBC 3.53 (L) 4.22 - 5.81 MIL/uL   Hemoglobin 9.2 (L) 13.0 - 17.0 g/dL   HCT 28.0 (L) 39 - 52 %   MCV 79.3 (L) 80.0 - 100.0 fL   MCH 26.1 26.0 - 34.0 pg   MCHC 32.9 30.0 - 36.0 g/dL   RDW 13.5 11.5 - 15.5 %   Platelets 224 150 - 400 K/uL   nRBC 0.0 0.0 - 0.2 %  Basic metabolic panel  Result Value Ref Range   Sodium 137 135 - 145 mmol/L   Potassium 4.1 3.5 - 5.1 mmol/L   Chloride 104 98 - 111 mmol/L   CO2 26 22 - 32 mmol/L   Glucose, Bld 125 (H) 70 - 99 mg/dL   BUN 29 (H) 8 - 23 mg/dL   Creatinine, Ser 1.18 0.61 - 1.24 mg/dL   Calcium 8.6 (L) 8.9 - 10.3 mg/dL   GFR, Estimated >60 >60 mL/min   Anion gap 7 5 - 15  Glucose, capillary  Result Value Ref  Range   Glucose-Capillary 218 (H) 70 - 99 mg/dL  Glucose, capillary  Result Value Ref Range   Glucose-Capillary 121 (H) 70 - 99 mg/dL  Glucose, capillary  Result Value Ref Range   Glucose-Capillary 120 (H) 70 - 99 mg/dL  Glucose, capillary  Result Value Ref Range   Glucose-Capillary 121 (H) 70 - 99 mg/dL  CBC  Result Value Ref Range   WBC 6.5 4.0 - 10.5 K/uL   RBC 3.83 (L) 4.22 - 5.81 MIL/uL   Hemoglobin 9.9 (L) 13.0 - 17.0 g/dL   HCT 30.8 (L) 39 - 52 %   MCV 80.4 80.0 - 100.0 fL   MCH 25.8 (L) 26.0 - 34.0 pg   MCHC 32.1 30.0 - 36.0 g/dL   RDW 13.6 11.5 - 15.5 %   Platelets 233 150 - 400 K/uL   nRBC 0.0 0.0 - 0.2 %  Glucose, capillary  Result Value Ref Range  Glucose-Capillary 129 (H) 70 - 99 mg/dL  Glucose, capillary  Result Value Ref Range   Glucose-Capillary 87 70 - 99 mg/dL  Glucose, capillary  Result Value Ref Range   Glucose-Capillary 91 70 - 99 mg/dL  CBC with Differential/Platelet  Result Value Ref Range   WBC 7.6 4.0 - 10.5 K/uL   RBC 4.02 (L) 4.22 - 5.81 MIL/uL   Hemoglobin 10.3 (L) 13.0 - 17.0 g/dL   HCT 32.0 (L) 39 - 52 %   MCV 79.6 (L) 80.0 - 100.0 fL   MCH 25.6 (L) 26.0 - 34.0 pg   MCHC 32.2 30.0 - 36.0 g/dL   RDW 13.4 11.5 - 15.5 %   Platelets 238 150 - 400 K/uL   nRBC 0.0 0.0 - 0.2 %   Neutrophils Relative % 71 %   Neutro Abs 5.4 1.7 - 7.7 K/uL   Lymphocytes Relative 20 %   Lymphs Abs 1.5 0.7 - 4.0 K/uL   Monocytes Relative 5 %   Monocytes Absolute 0.4 0.1 - 1.0 K/uL   Eosinophils Relative 2 %   Eosinophils Absolute 0.1 0.0 - 0.5 K/uL   Basophils Relative 0 %   Basophils Absolute 0.0 0.0 - 0.1 K/uL   Immature Granulocytes 2 %   Abs Immature Granulocytes 0.15 (H) 0.00 - 0.07 K/uL  Comprehensive metabolic panel  Result Value Ref Range   Sodium 138 135 - 145 mmol/L   Potassium 4.6 3.5 - 5.1 mmol/L   Chloride 104 98 - 111 mmol/L   CO2 28 22 - 32 mmol/L   Glucose, Bld 140 (H) 70 - 99 mg/dL   BUN 15 8 - 23 mg/dL   Creatinine, Ser 1.00 0.61 -  1.24 mg/dL   Calcium 8.7 (L) 8.9 - 10.3 mg/dL   Total Protein 6.0 (L) 6.5 - 8.1 g/dL   Albumin 2.4 (L) 3.5 - 5.0 g/dL   AST 11 (L) 15 - 41 U/L   ALT 13 0 - 44 U/L   Alkaline Phosphatase 49 38 - 126 U/L   Total Bilirubin 0.5 0.3 - 1.2 mg/dL   GFR, Estimated >60 >60 mL/min   Anion gap 6 5 - 15  Magnesium  Result Value Ref Range   Magnesium 2.2 1.7 - 2.4 mg/dL  Phosphorus  Result Value Ref Range   Phosphorus 3.2 2.5 - 4.6 mg/dL  Glucose, capillary  Result Value Ref Range   Glucose-Capillary 169 (H) 70 - 99 mg/dL  Glucose, capillary  Result Value Ref Range   Glucose-Capillary 266 (H) 70 - 99 mg/dL  Glucose, capillary  Result Value Ref Range   Glucose-Capillary 102 (H) 70 - 99 mg/dL  CBG monitoring, ED  Result Value Ref Range   Glucose-Capillary 216 (H) 70 - 99 mg/dL  Troponin I (High Sensitivity)  Result Value Ref Range   Troponin I (High Sensitivity) 13 <18 ng/L  Troponin I (High Sensitivity)  Result Value Ref Range   Troponin I (High Sensitivity) 12 <18 ng/L      Assessment & Plan:   Problem List Items Addressed This Visit      Respiratory   Pneumonia due to COVID-19 virus - Primary    Acute and improving, will continue to collaborate with pulmonary.  Had recent repeat CXR and labs.  At this time continue to monitor and will plan follow-up in 6 weeks to meet new PCP.        Relevant Medications   benzonatate (TESSALON) 200 MG capsule    Other Visit Diagnoses  Epistaxis       Acute and improving, started with covid.  At this time continue to monitor and if worsening return to office.   Need for influenza vaccination       Relevant Orders   Flu Vaccine QUAD 6+ mos PF IM (Fluarix Quad PF) (Completed)       Follow up plan: Return in about 6 weeks (around 01/03/2021) for T2DM, HTN/HLD -- meet new PCP.

## 2020-11-22 NOTE — Assessment & Plan Note (Signed)
Acute and improving, will continue to collaborate with pulmonary.  Had recent repeat CXR and labs.  At this time continue to monitor and will plan follow-up in 6 weeks to meet new PCP.

## 2020-12-12 ENCOUNTER — Other Ambulatory Visit: Payer: Self-pay | Admitting: Nurse Practitioner

## 2020-12-14 ENCOUNTER — Other Ambulatory Visit: Payer: Self-pay | Admitting: Nurse Practitioner

## 2020-12-14 ENCOUNTER — Telehealth: Payer: Self-pay

## 2020-12-14 MED ORDER — OZEMPIC (0.25 OR 0.5 MG/DOSE) 2 MG/1.5ML ~~LOC~~ SOPN
0.5000 mg | PEN_INJECTOR | SUBCUTANEOUS | 4 refills | Status: DC
Start: 2020-12-14 — End: 2020-12-14

## 2020-12-14 MED ORDER — OZEMPIC (0.25 OR 0.5 MG/DOSE) 2 MG/1.5ML ~~LOC~~ SOPN: 0.5000 mg | PEN_INJECTOR | SUBCUTANEOUS | 4 refills | Status: DC

## 2020-12-14 NOTE — Telephone Encounter (Signed)
Copied from CRM [id number]. Topic: General - Inquiry >> Dec 14, 2020 10:31 AM Daphine Deutscher D wrote: Reason for CRM: pt called saying he is out of the Ozempic and wants to know if he can get a refill on the .50.  He said the dose was changed and he has no refills on either dosage  CB#  [phone]  He uses [facility] in [place]

## 2020-12-14 NOTE — Telephone Encounter (Signed)
Sent in refills 

## 2020-12-14 NOTE — Telephone Encounter (Signed)
Routing to provider  

## 2020-12-27 ENCOUNTER — Other Ambulatory Visit: Payer: Self-pay | Admitting: Nurse Practitioner

## 2020-12-27 ENCOUNTER — Other Ambulatory Visit: Payer: Self-pay

## 2020-12-27 DIAGNOSIS — F330 Major depressive disorder, recurrent, mild: Secondary | ICD-10-CM

## 2020-12-27 MED ORDER — FLUTICASONE PROPIONATE 50 MCG/ACT NA SUSP: 2.0000 | Freq: Every day | NASAL | 6 refills | Status: DC

## 2020-12-27 MED ORDER — TRAZODONE HCL 50 MG PO TABS: 50.0000 mg | ORAL_TABLET | Freq: Every day | ORAL | 0 refills | Status: DC

## 2020-12-27 NOTE — Telephone Encounter (Signed)
Refill request for Flonase, and Trazadone  LOV 11/22/20  Up coming OV 01/05/21

## 2021-01-05 ENCOUNTER — Other Ambulatory Visit: Payer: Self-pay | Admitting: Nurse Practitioner

## 2021-01-05 ENCOUNTER — Ambulatory Visit: Payer: 59 | Admitting: Family Medicine

## 2021-01-26 ENCOUNTER — Other Ambulatory Visit: Payer: Self-pay

## 2021-01-26 ENCOUNTER — Ambulatory Visit (INDEPENDENT_AMBULATORY_CARE_PROVIDER_SITE_OTHER): Payer: 59 | Admitting: Nurse Practitioner

## 2021-01-26 ENCOUNTER — Encounter: Payer: Self-pay | Admitting: Nurse Practitioner

## 2021-01-26 VITALS — BP 107/72 | HR 63 | Temp 97.8°F | Wt 274.8 lb

## 2021-01-26 DIAGNOSIS — E1140 Type 2 diabetes mellitus with diabetic neuropathy, unspecified: Secondary | ICD-10-CM | POA: Diagnosis not present

## 2021-01-26 DIAGNOSIS — E119 Type 2 diabetes mellitus without complications: Secondary | ICD-10-CM | POA: Diagnosis not present

## 2021-01-26 DIAGNOSIS — Z794 Long term (current) use of insulin: Secondary | ICD-10-CM | POA: Diagnosis not present

## 2021-01-26 DIAGNOSIS — G8929 Other chronic pain: Secondary | ICD-10-CM

## 2021-01-26 DIAGNOSIS — I10 Essential (primary) hypertension: Secondary | ICD-10-CM

## 2021-01-26 DIAGNOSIS — I5022 Chronic systolic (congestive) heart failure: Secondary | ICD-10-CM

## 2021-01-26 DIAGNOSIS — M5441 Lumbago with sciatica, right side: Secondary | ICD-10-CM | POA: Diagnosis not present

## 2021-01-26 DIAGNOSIS — E782 Mixed hyperlipidemia: Secondary | ICD-10-CM

## 2021-01-26 LAB — MICROALBUMIN, URINE WAIVED
Creatinine, Urine Waived: 50 mg/dL (ref 10–300)
Microalb, Ur Waived: 10 mg/L (ref 0–19)
Microalb/Creat Ratio: <30 mg/g (ref <30)

## 2021-01-26 LAB — BAYER DCA HB A1C WAIVED: HB A1C (BAYER DCA - WAIVED): 6.5 % (ref <7.0)

## 2021-01-26 NOTE — Assessment & Plan Note (Signed)
Has a history of back pain with sciatica a year ago, although worsened in last 2 months. Straight leg raise test positive on right side. Use voltaren gel 3-4x a day on his lower back. Gave him stretches to do daily. Will follow-up in 1 week. If not improving, consider imaging.

## 2021-01-26 NOTE — Assessment & Plan Note (Signed)
Weight is up 20 pounds in last 2 months. Trace pitting edema noted in legs. Patient feels like he is fluid overloaded. Take an extra lasix pill for the next 3 days. Continue weighing daily. Follow-up in 4 days.

## 2021-01-26 NOTE — Progress Notes (Signed)
Established Patient Office Visit  Subjective:  Patient ID: Steven Vang, male    DOB: [birth year]  Age: 65 y.o. MRN: [id number]  CC:  Chief Complaint  Patient presents with  . Diabetes  . Hypertension  . Back Pain    Patient states he is having lower back pain for about a month that has been getting worse.   . Edema    Patient states he has noticed he has been swelling up a little more in his joints lately     HPI Steven Vang presents for follow-up on diabetes and hypertension. Also presents with back pain and leg swelling.  DIABETES  Hypoglycemic episodes:no Polydipsia/polyuria: no Visual disturbance: no Chest pain: no Glucose Monitoring: yes  Checking blood sugar intermittently 2-3x week. Fasting morning sugars ranged 150-160 Retinal Examination: Up to Date August 2021, he is unable to remember the Dr or practice name  Foot Exam: Not up to Date Diabetic Education: Completed Pneumovax: Up to Date Influenza: Up to Date Aspirin: yes   HYPERTENSION / HYPERLIPIDEMIA  Satisfied with current treatment? yes Duration of hypertension: chronic BP monitoring frequency: a few times a week BP medication side effects: no Past BP meds: losartan (cozaar), lasix, metoprolol Duration of hyperlipidemia: chronic Cholesterol medication side effects: no Cholesterol supplements: none Past cholesterol medications: pravastatin (pravachol) Medication compliance: excellent compliance Aspirin: yes Recent stressors: no Recurrent headaches: no Visual changes: no Palpitations: no Dyspnea: no Chest pain: no Lower extremity edema: yes Dizzy/lightheaded: no   LEG EDEMA  Started noticing extra fluid in his legs and hands in the last 2 months. A few weeks ago took an extra dose of lasix for 4 days. He could tell that it helped. Currently taking lasix 40mg  daily. He is weighing himself intermittently and noticed weight trending up. Denies shortness of breath.  BACK PAIN  Has had  this pain in the past before. Went to chiropractor for a couple adjustments and the pain went away. Now it is back again. Duration: months - started in November 2021 Mechanism of injury: unknown Location: low back Onset: gradual Severity: mild Quality: aching Frequency: constant Radiation: buttocks and R leg above the knee Aggravating factors: prolonged sitting Alleviating factors: walking, laying down, putting legs up Status: fluctuating Treatments attempted: rest, ice, heat, APAP and ibuprofen  Relief with NSAIDs?: no Nighttime pain:  yes Paresthesias / decreased sensation:  yes Bowel / bladder incontinence:  no Fevers:  no Dysuria / urinary frequency:  no  Past Medical History:  Diagnosis Date  . Abnormal EKG   . Anxiety disorder   . CAD (coronary artery disease)   . CHF (congestive heart failure) ([place])   . Chronic systolic heart failure ([place])   . Depression   . Diabetes mellitus ([place])   . GERD (gastroesophageal reflux disease)   . Hypertension   . Lipoma of head 03/31/2020  . Mixed hyperlipidemia   . Non morbid obesity due to excess calories   . Type 2 diabetes mellitus without complication, without long-term current use of insulin [facility])     Past Surgical History:  Procedure Laterality Date  . ANKLE FRACTURE SURGERY Right   . CARDIAC CATHETERIZATION    . CHOLECYSTECTOMY    . CORONARY STENT PLACEMENT  2015   3 stents  . HAND SURGERY Right   . LEFT HEART CATH AND CORONARY ANGIOGRAPHY Left 06/02/2020   Procedure: LEFT HEART CATH AND CORONARY ANGIOGRAPHY;  Surgeon: Dionisio David, MD;  Location: [facility];  Service: Cardiovascular;  Laterality: Left;    Family History  Problem Relation Age of Onset  . CAD Mother   . Diabetes Mother   . Colon cancer Father   . Breast cancer Paternal Grandmother   . Stomach cancer Paternal Grandfather     Social History   Socioeconomic History  . Marital status: Married    Spouse name: Not on file  . Number of  children: Not on file  . Years of education: Not on file  . Highest education level: Not on file  Occupational History  . Not on file  Tobacco Use  . Smoking status: Former Smoker    Types: Cigarettes    Quit date: 1990    Years since quitting: 32.1  . Smokeless tobacco: Never Used  Vaping Use  . Vaping Use: Never used  Substance and Sexual Activity  . Alcohol use: Not Currently  . Drug use: Never  . Sexual activity: Not on file  Other Topics Concern  . Not on file  Social History Narrative  . Not on file   Social Determinants of Health   Financial Resource Strain: Not on file  Food Insecurity: Not on file  Transportation Needs: Not on file  Physical Activity: Not on file  Stress: Not on file  Social Connections: Not on file  Intimate Partner Violence: Not on file    Outpatient Medications Prior to Visit  Medication Sig Dispense Refill  . aspirin 81 MG EC tablet Take 1 tablet by mouth daily.    . citalopram (CELEXA) 40 MG tablet Take 1 tablet (40 mg total) by mouth daily. 90 tablet 0  . clopidogrel (PLAVIX) 75 MG tablet Take 1 tablet (75 mg total) by mouth at bedtime.    . famotidine (PEPCID) 20 MG tablet Take 1 tablet (20 mg total) by mouth 2 (two) times daily. 180 tablet 0  . fluticasone (FLONASE) 50 MCG/ACT nasal spray Place 2 sprays into both nostrils daily. 16 g 6  . furosemide (LASIX) 40 MG tablet Hold until followup with your outpatient doctor because your blood pressure was normal without it and you had acute kidney injury. (Patient taking differently: 40 mg daily.) 30 tablet   . gabapentin (NEURONTIN) 300 MG capsule Take 2 capsules (600 mg total) by mouth at bedtime as needed.    . isosorbide mononitrate (IMDUR) 30 MG 24 hr tablet Hold until followup with your outpatient doctor because your blood pressure was low normal. 30 tablet 4  . losartan (COZAAR) 50 MG tablet Take 50 mg by mouth daily.    . metoprolol succinate (TOPROL-XL) 50 MG 24 hr tablet Take 50 mg by  mouth daily.    . pantoprazole (PROTONIX) 40 MG tablet Take 1 tablet (40 mg total) by mouth daily. 90 tablet 0  . pravastatin (PRAVACHOL) 80 MG tablet Take 80 mg by mouth daily.    . Semaglutide,0.25 or 0.5MG /DOS, (OZEMPIC, 0.25 OR 0.5 MG/DOSE,) 2 MG/1.5ML SOPN Inject 0.5 mg into the skin once a week. 4.5 mL 4  . traZODone (DESYREL) 50 MG tablet Take 1 tablet (50 mg total) by mouth at bedtime. PRN 90 tablet 0  . benzonatate (TESSALON) 200 MG capsule Take by mouth.    . insulin detemir (LEVEMIR) 100 UNIT/ML FlexPen Inject 18 Units into the skin 2 (two) times daily. (Patient not taking: Reported on 01/26/2021) 15 mL 1   No facility-administered medications prior to visit.    No Known Allergies  ROS Review of Systems  Constitutional: Positive  for fatigue.  HENT: Negative.   Eyes: Negative.   Respiratory: Negative.  Negative for cough and shortness of breath.   Cardiovascular: Negative.   Gastrointestinal: Negative.   Endocrine: Negative.   Genitourinary: Negative.   Musculoskeletal: Positive for back pain.  Skin: Negative.   Neurological: Negative.   Psychiatric/Behavioral: Negative.       Objective:    Physical Exam Vitals and nursing note reviewed.  Constitutional:      General: He is not in acute distress.    Appearance: Normal appearance.  HENT:     Head: Normocephalic.  Cardiovascular:     Rate and Rhythm: Normal rate and regular rhythm.     Pulses: Normal pulses.     Heart sounds: Normal heart sounds.  Pulmonary:     Effort: Pulmonary effort is normal.     Breath sounds: Normal breath sounds.  Abdominal:     General: There is no distension.     Palpations: Abdomen is soft.     Tenderness: There is no abdominal tenderness.  Musculoskeletal:     Cervical back: Normal range of motion.     Lumbar back: Bony tenderness present. No swelling. Normal range of motion. Positive right straight leg raise test. Negative left straight leg raise test.     Right lower leg:  Edema (trace pitting) present.     Left lower leg: Edema (trace pitting) present.  Skin:    General: Skin is warm and dry.  Neurological:     General: No focal deficit present.     Mental Status: He is alert and oriented to person, place, and time.  Psychiatric:        Mood and Affect: Mood normal.        Behavior: Behavior normal.        Thought Content: Thought content normal.        Judgment: Judgment normal.    Diabetic Foot Exam - Simple   Simple Foot Form Diabetic Foot exam was performed with the following findings: Yes 01/26/2021 12:50 PM  Visual Inspection No deformities, no ulcerations, no other skin breakdown bilaterally: Yes Sensation Testing See comments: Yes Pulse Check Posterior Tibialis and Dorsalis pulse intact bilaterally: Yes Comments Right foot had several areas of not feeling the monofilament test on his toes. All other areas intact.     BP 107/72   Pulse 63   Temp 97.8 F (36.6 C)   Wt 274 lb 12.8 oz (124.6 kg)   SpO2 99%   BMI 39.21 kg/m  Wt Readings from Last 3 Encounters:  01/26/21 274 lb 12.8 oz (124.6 kg)  11/22/20 254 lb 12.8 oz (115.6 kg)  10/24/20 240 lb (108.9 kg)     Lab Results  Component Value Date   TSH 2.500 03/31/2020   Lab Results  Component Value Date   WBC 7.6 10/19/2020   HGB 10.3 (L) 10/19/2020   HCT 32.0 (L) 10/19/2020   MCV 79.6 (L) 10/19/2020   PLT 238 10/19/2020   Lab Results  Component Value Date   NA 138 10/19/2020   K 4.6 10/19/2020   CO2 28 10/19/2020   GLUCOSE 140 (H) 10/19/2020   BUN 15 10/19/2020   CREATININE 1.00 10/19/2020   BILITOT 0.5 10/19/2020   ALKPHOS 49 10/19/2020   AST 11 (L) 10/19/2020   ALT 13 10/19/2020   PROT 6.0 (L) 10/19/2020   ALBUMIN 2.4 (L) 10/19/2020   CALCIUM 8.7 (L) 10/19/2020   ANIONGAP 6 10/19/2020   Lab Results  Component Value Date   CHOL 173 03/31/2020   Lab Results  Component Value Date   HDL 36 (L) 03/31/2020   Lab Results  Component Value Date   LDLCALC  106 (H) 03/31/2020   Lab Results  Component Value Date   TRIG 176 (H) 03/31/2020    Lab Results  Component Value Date   HGBA1C 7.7 (H) 10/03/2020      Assessment & Plan:   Problem List Items Addressed This Visit      Cardiovascular and Mediastinum   Chronic systolic heart failure ([place])    Weight is up 20 pounds in last 2 months. Trace pitting edema noted in legs. Patient feels like he is fluid overloaded. Take an extra lasix pill for the next 3 days. Continue weighing daily. Follow-up in 4 days.      Relevant Medications   losartan (COZAAR) 50 MG tablet   Essential hypertension    Chronic, stable. Blood pressure within goal today. Continue current regimen. Check BMP, CBC today.      Relevant Medications   losartan (COZAAR) 50 MG tablet   Other Relevant Orders   CBC with Differential   Microalbumin, Urine Waived   Basic Metabolic Panel (BMET)     Endocrine   Type 2 diabetes mellitus with diabetic neuropathy, without long-term current use of insulin ([facility]) - Primary    Chronic, stable. A1C 6.5% today. He stopped levimir 2 months ago, will discontinue this off his medication list. Continue semaglutide 0.5mg  weekly. Foot exam done today. Showed loss of sensation of monofilament on right toes. Educated on foot care. Urine microalbumin negative. Follow-up in 3 months      Relevant Medications   losartan (COZAAR) 50 MG tablet   Other Relevant Orders   Lipid Panel w/o Chol/HDL Ratio   Bayer DCA Hb A1c Waived   Microalbumin, Urine Waived   Basic Metabolic Panel (BMET)     Nervous and Auditory   Chronic midline low back pain with right-sided sciatica    Has a history of back pain with sciatica a year ago, although worsened in last 2 months. Straight leg raise test positive on right side. Use voltaren gel 3-4x a day on his lower back. Gave him stretches to do daily. Will follow-up in 1 week. If not improving, consider imaging.         Other   Mixed hyperlipidemia     Chronic, stable. Continue current regimen. Check lipid panel today. Follow-up in 6 months.      Relevant Medications   losartan (COZAAR) 50 MG tablet   Other Relevant Orders   CBC with Differential   Lipid Panel w/o Chol/HDL Ratio   Basic Metabolic Panel (BMET)      No orders of the defined types were placed in this encounter.   Follow-up: Return in about 4 days (around 01/30/2021) for edema and back pain.    Charyl Dancer, NP

## 2021-01-26 NOTE — Assessment & Plan Note (Signed)
Chronic, stable. Continue current regimen. Check lipid panel today. Follow-up in 6 months. 

## 2021-01-26 NOTE — Patient Instructions (Addendum)
You can try voltaren gel OTC.   Sciatica Rehab Ask your health care provider which exercises are safe for you. Do exercises exactly as told by your health care provider and adjust them as directed. It is normal to feel mild stretching, pulling, tightness, or discomfort as you do these exercises. Stop right away if you feel sudden pain or your pain gets worse. Do not begin these exercises until told by your health care provider. Stretching and range-of-motion exercises These exercises warm up your muscles and joints and improve the movement and flexibility of your hips and back. These exercises also help to relieve pain, numbness, and tingling. Sciatic nerve glide 1. Sit in a chair with your head facing down toward your chest. Place your hands behind your back. Let your shoulders slump forward. 2. Slowly straighten one of your legs while you tilt your head back as if you are looking toward the ceiling. Only straighten your leg as far as you can without making your symptoms worse. 3. Hold this position for __________ seconds. 4. Slowly return to the starting position. 5. Repeat with your other leg. Repeat __________ times. Complete this exercise __________ times a day. Knee to chest with hip adduction and internal rotation 1. Lie on your back on a firm surface with both legs straight. 2. Bend one of your knees and move it up toward your chest until you feel a gentle stretch in your lower back and buttock. Then, move your knee toward the shoulder that is on the opposite side from your leg. This is hip adduction and internal rotation. ? Hold your leg in this position by holding on to the front of your knee. 3. Hold this position for __________ seconds. 4. Slowly return to the starting position. 5. Repeat with your other leg. Repeat __________ times. Complete this exercise __________ times a day.   Prone extension on elbows 1. Lie on your abdomen on a firm surface. A bed may be too soft for this  exercise. 2. Prop yourself up on your elbows. 3. Use your arms to help lift your chest up until you feel a gentle stretch in your abdomen and your lower back. ? This will place some of your body weight on your elbows. If this is uncomfortable, try stacking pillows under your chest. ? Your hips should stay down, against the surface that you are lying on. Keep your hip and back muscles relaxed. 4. Hold this position for __________ seconds. 5. Slowly relax your upper body and return to the starting position. Repeat __________ times. Complete this exercise __________ times a day.   Strengthening exercises These exercises build strength and endurance in your back. Endurance is the ability to use your muscles for a long time, even after they get tired. Pelvic tilt This exercise strengthens the muscles that lie deep in the abdomen. 1. Lie on your back on a firm surface. Bend your knees and keep your feet flat on the floor. 2. Tense your abdominal muscles. Tip your pelvis up toward the ceiling and flatten your lower back into the floor. ? To help with this exercise, you may place a small towel under your lower back and try to push your back into the towel. 3. Hold this position for __________ seconds. 4. Let your muscles relax completely before you repeat this exercise. Repeat __________ times. Complete this exercise __________ times a day. Alternating arm and leg raises 1. Get on your hands and knees on a firm surface. If you are  on a hard floor, you may want to use padding, such as an exercise mat, to cushion your knees. 2. Line up your arms and legs. Your hands should be directly below your shoulders, and your knees should be directly below your hips. 3. Lift your left leg behind you. At the same time, raise your right arm and straighten it in front of you. ? Do not lift your leg higher than your hip. ? Do not lift your arm higher than your shoulder. ? Keep your abdominal and back muscles  tight. ? Keep your hips facing the ground. ? Do not arch your back. ? Keep your balance carefully, and do not hold your breath. 4. Hold this position for __________ seconds. 5. Slowly return to the starting position. 6. Repeat with your right leg and your left arm. Repeat __________ times. Complete this exercise __________ times a day.   Posture and body mechanics Good posture and healthy body mechanics can help to relieve stress in your body's tissues and joints. Body mechanics refers to the movements and positions of your body while you do your daily activities. Posture is part of body mechanics. Good posture means:  Your spine is in its natural S-curve position (neutral).  Your shoulders are pulled back slightly.  Your head is not tipped forward. Follow these guidelines to improve your posture and body mechanics in your everyday activities. Standing  When standing, keep your spine neutral and your feet about hip width apart. Keep a slight bend in your knees. Your ears, shoulders, and hips should line up.  When you do a task in which you stand in one place for a long time, place one foot up on a stable object that is 2-4 inches (5-10 cm) high, such as a footstool. This helps keep your spine neutral.   Sitting  When sitting, keep your spine neutral and keep your feet flat on the floor. Use a footrest, if necessary, and keep your thighs parallel to the floor. Avoid rounding your shoulders, and avoid tilting your head forward.  When working at a desk or a computer, keep your desk at a height where your hands are slightly lower than your elbows. Slide your chair under your desk so you are close enough to maintain good posture.  When working at a computer, place your monitor at a height where you are looking straight ahead and you do not have to tilt your head forward or downward to look at the screen.   Resting  When lying down and resting, avoid positions that are most painful for  you.  If you have pain with activities such as sitting, bending, stooping, or squatting, lie in a position in which your body does not bend very much. For example, avoid curling up on your side with your arms and knees near your chest (fetal position).  If you have pain with activities such as standing for a long time or reaching with your arms, lie with your spine in a neutral position and bend your knees slightly. Try the following positions: ? Lying on your side with a pillow between your knees. ? Lying on your back with a pillow under your knees. Lifting  When lifting objects, keep your feet at least shoulder width apart and tighten your abdominal muscles.  Bend your knees and hips and keep your spine neutral. It is important to lift using the strength of your legs, not your back. Do not lock your knees straight out.  Always ask  for help to lift heavy or awkward objects.   This information is not intended to replace advice given to you by your health care provider. Make sure you discuss any questions you have with your health care provider. Document Revised: 03/26/2019 Document Reviewed: 12/24/2018 Elsevier Patient Education  2021 [place]. Sciatica  Sciatica is pain, weakness, tingling, or loss of feeling (numbness) along the sciatic nerve. The sciatic nerve starts in the lower back and goes down the back of each leg. Sciatica usually goes away on its own or with treatment. Sometimes, sciatica may come back (recur). What are the causes? This condition happens when the sciatic nerve is pinched or has pressure put on it. This may be the result of:  A disk in between the bones of the spine bulging out too far (herniated disk).  Changes in the spinal disks that occur with aging.  A condition that affects a muscle in the butt.  Extra bone growth near the sciatic nerve.  A break (fracture) of the area between your hip bones (pelvis).  Pregnancy.  Tumor. This is rare. What  increases the risk? You are more likely to develop this condition if you:  Play sports that put pressure or stress on the spine.  Have poor strength and ease of movement (flexibility).  Have had a back injury in the past.  Have had back surgery.  Sit for long periods of time.  Do activities that involve bending or lifting over and over again.  Are very overweight (obese). What are the signs or symptoms? Symptoms can vary from mild to very bad. They may include:  Any of these problems in the lower back, leg, hip, or butt: ? Mild tingling, loss of feeling, or dull aches. ? Burning sensations. ? Sharp pains.  Loss of feeling in the back of the calf or the sole of the foot.  Leg weakness.  Very bad back pain that makes it hard to move. These symptoms may get worse when you cough, sneeze, or laugh. They may also get worse when you sit or stand for long periods of time. How is this treated? This condition often gets better without any treatment. However, treatment may include:  Changing or cutting back on physical activity when you have pain.  Doing exercises and stretching.  Putting ice or heat on the affected area.  Medicines that help: ? To relieve pain and swelling. ? To relax your muscles.  Shots (injections) of medicines that help to relieve pain, irritation, and swelling.  Surgery. Follow these instructions at home: Medicines  Take over-the-counter and prescription medicines only as told by your doctor.  Ask your doctor if the medicine prescribed to you: ? Requires you to avoid driving or using heavy machinery. ? Can cause trouble pooping (constipation). You may need to take these steps to prevent or treat trouble pooping:  Drink enough fluids to keep your pee (urine) pale yellow.  Take over-the-counter or prescription medicines.  Eat foods that are high in fiber. These include beans, whole grains, and fresh fruits and vegetables.  Limit foods that are  high in fat and sugar. These include fried or sweet foods. Managing pain  If told, put ice on the affected area. ? Put ice in a plastic bag. ? Place a towel between your skin and the bag. ? Leave the ice on for 20 minutes, 2-3 times a day.  If told, put heat on the affected area. Use the heat source that your doctor tells  you to use, such as a moist heat pack or a heating pad. ? Place a towel between your skin and the heat source. ? Leave the heat on for 20-30 minutes. ? Remove the heat if your skin turns bright red. This is very important if you are unable to feel pain, heat, or cold. You may have a greater risk of getting burned.      Activity  Return to your normal activities as told by your doctor. Ask your doctor what activities are safe for you.  Avoid activities that make your symptoms worse.  Take short rests during the day. ? When you rest for a long time, do some physical activity or stretching between periods of rest. ? Avoid sitting for a long time without moving. Get up and move around at least one time each hour.  Exercise and stretch regularly, as told by your doctor.  Do not lift anything that is heavier than 10 lb (4.5 kg) while you have symptoms of sciatica. ? Avoid lifting heavy things even when you do not have symptoms. ? Avoid lifting heavy things over and over.  When you lift objects, always lift in a way that is safe for your body. To do this, you should: ? Bend your knees. ? Keep the object close to your body. ? Avoid twisting.   General instructions  Stay at a healthy weight.  Wear comfortable shoes that support your feet. Avoid wearing high heels.  Avoid sleeping on a mattress that is too soft or too hard. You might have less pain if you sleep on a mattress that is firm enough to support your back.  Keep all follow-up visits as told by your doctor. This is important. Contact a doctor if:  You have pain that: ? Wakes you up when you are  sleeping. ? Gets worse when you lie down. ? Is worse than the pain you have had in the past. ? Lasts longer than 4 weeks.  You lose weight without trying. Get help right away if:  You cannot control when you pee (urinate) or poop (have a bowel movement).  You have weakness in any of these areas and it gets worse: ? Lower back. ? The area between your hip bones. ? Butt. ? Legs.  You have redness or swelling of your back.  You have a burning feeling when you pee. Summary  Sciatica is pain, weakness, tingling, or loss of feeling (numbness) along the sciatic nerve.  This condition happens when the sciatic nerve is pinched or has pressure put on it.  Sciatica can cause pain, tingling, or loss of feeling (numbness) in the lower back, legs, hips, and butt.  Treatment often includes rest, exercise, medicines, and putting ice or heat on the affected area. This information is not intended to replace advice given to you by your health care provider. Make sure you discuss any questions you have with your health care provider. Document Revised: 12/21/2018 Document Reviewed: 12/21/2018 Elsevier Patient Education  [place].

## 2021-01-26 NOTE — Assessment & Plan Note (Addendum)
Chronic, stable. A1C 6.5% today. He stopped levimir 2 months ago, will discontinue this off his medication list. Continue semaglutide 0.5mg  weekly. Foot exam done today. Showed loss of sensation of monofilament on right toes. Educated on foot care. Urine microalbumin negative. Follow-up in 3 months

## 2021-01-26 NOTE — Assessment & Plan Note (Signed)
Chronic, stable. Blood pressure within goal today. Continue current regimen. Check BMP, CBC today.

## 2021-01-27 LAB — LIPID PANEL W/O CHOL/HDL RATIO
Cholesterol, Total: 178 mg/dL (ref 100–199)
HDL: 35 mg/dL — ABNORMAL LOW (ref >39)
LDL Chol Calc (NIH): 103 mg/dL — ABNORMAL HIGH (ref 0–99)
Triglycerides: 233 mg/dL — ABNORMAL HIGH (ref 0–149)
VLDL Cholesterol Cal: 40 mg/dL (ref 5–40)

## 2021-01-27 LAB — CBC WITH DIFFERENTIAL/PLATELET
Basophils Absolute: 0.1 10*3/uL (ref 0.0–0.2)
Basos: 1 %
EOS (ABSOLUTE): 0.3 10*3/uL (ref 0.0–0.4)
Eos: 5 %
Hematocrit: 39.2 % (ref 37.5–51.0)
Hemoglobin: 12.8 g/dL — ABNORMAL LOW (ref 13.0–17.7)
Immature Grans (Abs): 0 10*3/uL (ref 0.0–0.1)
Immature Granulocytes: 1 %
Lymphocytes Absolute: 1.9 10*3/uL (ref 0.7–3.1)
Lymphs: 33 %
MCH: 25.4 pg — ABNORMAL LOW (ref 26.6–33.0)
MCHC: 32.7 g/dL (ref 31.5–35.7)
MCV: 78 fL — ABNORMAL LOW (ref 79–97)
Monocytes Absolute: 0.5 10*3/uL (ref 0.1–0.9)
Monocytes: 8 %
Neutrophils Absolute: 3.1 10*3/uL (ref 1.4–7.0)
Neutrophils: 52 %
Platelets: 179 10*3/uL (ref 150–450)
RBC: 5.03 x10E6/uL (ref 4.14–5.80)
RDW: 13.6 % (ref 11.6–15.4)
WBC: 5.9 10*3/uL (ref 3.4–10.8)

## 2021-01-27 LAB — BASIC METABOLIC PANEL
BUN/Creatinine Ratio: 10 (ref 10–24)
BUN: 17 mg/dL (ref 8–27)
CO2: 23 mmol/L (ref 20–29)
Calcium: 9.2 mg/dL (ref 8.6–10.2)
Chloride: 105 mmol/L (ref 96–106)
Creatinine, Ser: 1.62 mg/dL — ABNORMAL HIGH (ref 0.76–1.27)
GFR calc Af Amer: 51 mL/min/{1.73_m2} — ABNORMAL LOW (ref >59)
GFR calc non Af Amer: 45 mL/min/{1.73_m2} — ABNORMAL LOW (ref >59)
Glucose: 166 mg/dL — ABNORMAL HIGH (ref 65–99)
Potassium: 4.7 mmol/L (ref 3.5–5.2)
Sodium: 142 mmol/L (ref 134–144)

## 2021-01-31 ENCOUNTER — Telehealth: Payer: Self-pay | Admitting: Family Medicine

## 2021-01-31 DIAGNOSIS — E119 Type 2 diabetes mellitus without complications: Secondary | ICD-10-CM

## 2021-01-31 NOTE — Telephone Encounter (Signed)
Copied from [place] [id number]. Topic: Quick Communication - Rx Refill/Question >> Jan 31, 2021 12:23 PM Mcneil, Ja-Kwan wrote: Medication: furosemide (LASIX) 40 MG tablet and gabapentin (NEURONTIN) 300 MG capsule   Has the patient contacted their pharmacy? yes - Pt stated he was told to call pcp  Preferred Pharmacy (with phone number or street name): [facility], [address] Phone: [phone]   Fax: [phone]  Agent: Please be advised that RX refills may take up to 3 business days. We ask that you follow-up with your pharmacy.

## 2021-01-31 NOTE — Telephone Encounter (Signed)
Patient was last seen by Lauren 01/26/21 and patient is scheduled for Friday 02/03/20 with Lauren.

## 2021-01-31 NOTE — Telephone Encounter (Signed)
Requested medication (s) are due for refill today: Furosemide and Gabapentin  Requested medication (s) are on the active medication list: yes  Last refill:   Future visit scheduled: no  Notes to clinic:  original RX per Dr Enzo Bi, [facility]; pt seen in office 01/26/21   Requested Prescriptions  Pending Prescriptions Disp Refills   furosemide (LASIX) 40 MG tablet 30 tablet     Sig: Hold until followup with your outpatient doctor because your blood pressure was normal without it and you had acute kidney injury.      Cardiovascular:  Diuretics - Loop Failed - 01/31/2021 12:36 PM      Failed - Cr in normal range and within 360 days    Creatinine  Date Value Ref Range Status  07/15/2014 1.07 0.60 - 1.30 mg/dL Final   Creatinine, Ser  Date Value Ref Range Status  01/26/2021 1.62 (H) 0.76 - 1.27 mg/dL Final          Passed - K in normal range and within 360 days    Potassium  Date Value Ref Range Status  01/26/2021 4.7 3.5 - 5.2 mmol/L Final  07/15/2014 4.4 3.5 - 5.1 mmol/L Final          Passed - Ca in normal range and within 360 days    Calcium  Date Value Ref Range Status  01/26/2021 9.2 8.6 - 10.2 mg/dL Final   Calcium, Total  Date Value Ref Range Status  07/15/2014 8.3 (L) 8.5 - 10.1 mg/dL Final          Passed - Na in normal range and within 360 days    Sodium  Date Value Ref Range Status  01/26/2021 142 134 - 144 mmol/L Final  07/15/2014 144 136 - 145 mmol/L Final          Passed - Last BP in normal range    BP Readings from Last 1 Encounters:  01/26/21 107/72          Passed - Valid encounter within last 6 months    Recent Outpatient Visits           5 days ago Type 2 diabetes mellitus with diabetic neuropathy, without long-term current use of insulin ([place])   [place], Lauren A, NP   2 months ago Pneumonia due to COVID-19 virus   [employer], Wayne T, NP   3 months ago Pneumonia due to COVID-19 virus    [facility] Eulogio Bear, NP   7 months ago Screening for colon cancer   [facility] Noemi [place] A, NP   10 months ago Type 2 diabetes mellitus without complication, without long-term current use of insulin ([place])   [place], Jessica A, NP       Future Appointments             In 2 days McElwee, Scheryl Darter, NP [facility]               gabapentin (NEURONTIN) 300 MG capsule      Sig: Take 2 capsules (600 mg total) by mouth at bedtime as needed.      Neurology: Anticonvulsants - gabapentin Passed - 01/31/2021 12:36 PM      Passed - Valid encounter within last 12 months    Recent Outpatient Visits           5 days ago Type 2 diabetes mellitus with diabetic neuropathy, without long-term current  use of insulin ([place])   [place], Lauren A, NP   2 months ago Pneumonia due to COVID-19 virus   [place], [place] T, NP   3 months ago Pneumonia due to COVID-19 virus   [place], NP   7 months ago Screening for colon cancer   [facility] Noemi [place] A, NP   10 months ago Type 2 diabetes mellitus without complication, without long-term current use of insulin ([place])   [place] Eulogio Bear, NP       Future Appointments             In 2 days McElwee, Scheryl Darter, NP [place], [place]

## 2021-02-01 ENCOUNTER — Telehealth: Payer: Self-pay

## 2021-02-01 NOTE — Telephone Encounter (Signed)
Called and LVM asking for patient to please return my call.  

## 2021-02-01 NOTE — Telephone Encounter (Signed)
-----   Message from Valerie Roys, DO sent at 02/01/2021  3:45 PM EST ----- Cancelled appt for tomorrow and booked for 1 month out. Can we see if he's better- otherwise please get scheduled with Lauren for next week, needs labs.

## 2021-02-02 ENCOUNTER — Ambulatory Visit: Payer: 59 | Admitting: Nurse Practitioner

## 2021-02-05 ENCOUNTER — Encounter: Payer: Self-pay | Admitting: Nurse Practitioner

## 2021-02-05 ENCOUNTER — Ambulatory Visit: Payer: 59 | Admitting: Nurse Practitioner

## 2021-02-05 ENCOUNTER — Other Ambulatory Visit: Payer: Self-pay

## 2021-02-05 VITALS — BP 131/85 | HR 84 | Temp 98.6°F | Wt 274.0 lb

## 2021-02-05 DIAGNOSIS — G8929 Other chronic pain: Secondary | ICD-10-CM

## 2021-02-05 DIAGNOSIS — R600 Localized edema: Secondary | ICD-10-CM | POA: Diagnosis not present

## 2021-02-05 DIAGNOSIS — E119 Type 2 diabetes mellitus without complications: Secondary | ICD-10-CM | POA: Diagnosis not present

## 2021-02-05 DIAGNOSIS — M5441 Lumbago with sciatica, right side: Secondary | ICD-10-CM | POA: Diagnosis not present

## 2021-02-05 MED ORDER — FUROSEMIDE 20 MG PO TABS: ORAL_TABLET | ORAL | Status: DC

## 2021-02-05 MED ORDER — GABAPENTIN 300 MG PO CAPS: 600.0000 mg | ORAL_CAPSULE | Freq: Every evening | ORAL | 1 refills | Status: DC | PRN

## 2021-02-05 NOTE — Telephone Encounter (Signed)
Called pt and advised of Dr Durenda Age message. Pt states that he pushed out the appt due to not having the medicine, scheduled pt today due to him saying that he is in town today but is hardly in town. Scheduled with Santiago Glad

## 2021-02-05 NOTE — Telephone Encounter (Signed)
Patient cancelled his appointment and booked it for another month out. Not appropriate. Needs to be seen sooner.

## 2021-02-05 NOTE — Progress Notes (Signed)
BP 131/85   Pulse 84   Temp 98.6 F (37 C)   Wt 274 lb (124.3 kg)   SpO2 97%   BMI 39.09 kg/m    Subjective:    Patient ID: Steven Vang, male    DOB: [birth year], 65 y.o.   MRN: [id number]  HPI: Steven Vang is a 65 y.o. male  Chief Complaint  Patient presents with  . Back Pain  . Edema  . Leg Pain    Refill on gabapentin   LEG EDEMA Started noticing extra fluid in his legs and hands in the last 2 months. A few weeks ago took an extra dose of lasix for 4 days. He could tell that it helped. Currently taking lasix 40mg  daily. He is weighing himself intermittently and noticed weight trending up. Denies shortness of breath.  At last visit patient was Weight is up 20 pounds in last 2 months. Trace pitting edema noted in legs. Patient feels like he is fluid overloaded. Patient was told to take an extra lasix pill for the next 3 days. Continue weighing daily. Follow-up in 4 days.  Patient was not able to keep appintment and is now requesting a refill on the lasix.   BACK PAIN Has had this pain in the past before. Went to chiropractor for a couple adjustments and the pain went away. Now it is back again.  At last visit patient was given voltaren gel to use for patient and told to follow up in 5 days.  Patient was unable to keep that appointment.  Duration: months - started in November 2021 Mechanism of injury: unknown Location: low back Onset: gradual Severity: mild Quality: aching Frequency: constant Radiation: buttocks and R leg above the knee Aggravating factors: prolonged sitting Alleviating factors: walking, laying down, putting legs up Status: fluctuating Treatments attempted: rest, ice, heat, APAP and ibuprofen  Relief with NSAIDs?: no Nighttime pain:  yes Paresthesias / decreased sensation:  yes Bowel / bladder incontinence:  no Fevers:  no Dysuria / urinary frequency:  no Relevant past medical, surgical, family and social history reviewed and updated as  indicated. Interim medical history since our last visit reviewed. Allergies and medications reviewed and updated.  Patient states he has been taking Gabapentin 300-600 nightly for Neuropathy.  Patient has been out for 2 weeks and has noticed the thingling and shooting pains worsening.  Review of Systems  Cardiovascular: Positive for leg swelling.  Musculoskeletal: Positive for back pain.  Neurological: Positive for numbness.       Shooting pains    Per HPI unless specifically indicated above     Objective:    BP 131/85   Pulse 84   Temp 98.6 F (37 C)   Wt 274 lb (124.3 kg)   SpO2 97%   BMI 39.09 kg/m   Wt Readings from Last 3 Encounters:  02/05/21 274 lb (124.3 kg)  01/26/21 274 lb 12.8 oz (124.6 kg)  11/22/20 254 lb 12.8 oz (115.6 kg)    Physical Exam Vitals and nursing note reviewed.  Constitutional:      General: He is not in acute distress.    Appearance: Normal appearance. He is not ill-appearing, toxic-appearing or diaphoretic.  HENT:     Head: Normocephalic.     Right Ear: External ear normal.     Left Ear: External ear normal.     Nose: Nose normal. No congestion or rhinorrhea.     Mouth/Throat:     Mouth: Mucous membranes are  moist.  Eyes:     General:        Right eye: No discharge.        Left eye: No discharge.     Extraocular Movements: Extraocular movements intact.     Conjunctiva/sclera: Conjunctivae normal.     Pupils: Pupils are equal, round, and reactive to light.  Cardiovascular:     Rate and Rhythm: Normal rate and regular rhythm.     Heart sounds: No murmur heard.   Pulmonary:     Effort: Pulmonary effort is normal. No respiratory distress.     Breath sounds: Normal breath sounds. No wheezing, rhonchi or rales.  Abdominal:     General: Abdomen is flat. Bowel sounds are normal.  Musculoskeletal:     Cervical back: Normal range of motion and neck supple.     Right lower leg: 1+ Edema present.     Left lower leg: 1+ Edema present.   Skin:    General: Skin is warm and dry.     Capillary Refill: Capillary refill takes less than 2 seconds.  Neurological:     General: No focal deficit present.     Mental Status: He is alert and oriented to person, place, and time.  Psychiatric:        Mood and Affect: Mood normal.        Behavior: Behavior normal.        Thought Content: Thought content normal.        Judgment: Judgment normal.     Results for orders placed or performed in visit on 01/26/21  CBC with Differential  Result Value Ref Range   WBC 5.9 3.4 - 10.8 x10E3/uL   RBC 5.03 4.14 - 5.80 x10E6/uL   Hemoglobin 12.8 (L) 13.0 - 17.7 g/dL   Hematocrit 39.2 37.5 - 51.0 %   MCV 78 (L) 79 - 97 fL   MCH 25.4 (L) 26.6 - 33.0 pg   MCHC 32.7 31.5 - 35.7 g/dL   RDW 13.6 11.6 - 15.4 %   Platelets 179 150 - 450 x10E3/uL   Neutrophils 52 Not Estab. %   Lymphs 33 Not Estab. %   Monocytes 8 Not Estab. %   Eos 5 Not Estab. %   Basos 1 Not Estab. %   Neutrophils Absolute 3.1 1.4 - 7.0 x10E3/uL   Lymphocytes Absolute 1.9 0.7 - 3.1 x10E3/uL   Monocytes Absolute 0.5 0.1 - 0.9 x10E3/uL   EOS (ABSOLUTE) 0.3 0.0 - 0.4 x10E3/uL   Basophils Absolute 0.1 0.0 - 0.2 x10E3/uL   Immature Granulocytes 1 Not Estab. %   Immature Grans (Abs) 0.0 0.0 - 0.1 x10E3/uL  Lipid Panel w/o Chol/HDL Ratio  Result Value Ref Range   Cholesterol, Total 178 100 - 199 mg/dL   Triglycerides 233 (H) 0 - 149 mg/dL   HDL 35 (L) >39 mg/dL   VLDL Cholesterol Cal 40 5 - 40 mg/dL   LDL Chol Calc (NIH) 103 (H) 0 - 99 mg/dL  Bayer DCA Hb A1c Waived  Result Value Ref Range   HB A1C (BAYER DCA - WAIVED) 6.5 <7.0 %  Microalbumin, Urine Waived  Result Value Ref Range   Microalb, Ur Waived 10 0 - 19 mg/L   Creatinine, Urine Waived 50 10 - 300 mg/dL   Microalb/Creat Ratio <30 <30 mg/g  Basic Metabolic Panel (BMET)  Result Value Ref Range   Glucose 166 (H) 65 - 99 mg/dL   BUN 17 8 - 27 mg/dL   Creatinine,  Ser 1.62 (H) 0.76 - 1.27 mg/dL   GFR calc non Af  Amer 45 (L) >59 mL/min/1.73   GFR calc Af Amer 51 (L) >59 mL/min/1.73   BUN/Creatinine Ratio 10 10 - 24   Sodium 142 134 - 144 mmol/L   Potassium 4.7 3.5 - 5.2 mmol/L   Chloride 105 96 - 106 mmol/L   CO2 23 20 - 29 mmol/L   Calcium 9.2 8.6 - 10.2 mg/dL      Assessment & Plan:   Problem List Items Addressed This Visit   None   Visit Diagnoses    Type 2 diabetes mellitus without complication, without long-term current use of insulin (HCC)    -  Primary   Relevant Medications   gabapentin (NEURONTIN) 300 MG capsule   Chronic right-sided low back pain with right-sided sciatica       Ongoing.  Referred patient to Orthopedics as this has been ongoing for several years.   Relevant Medications   gabapentin (NEURONTIN) 300 MG capsule   Other Relevant Orders   Ambulatory referral to Orthopedics   Lower extremity edema       Ongoing.  Reviewed BMP from last visit with patient today.  Recommend patient see Cardiologist.  Patient has not seen Cardiologist in over a year.   Relevant Medications   furosemide (LASIX) 20 MG tablet       Follow up plan: Return in about 3 months (around 05/05/2021) for HTN, HLD, DM2 FU.

## 2021-02-05 NOTE — Telephone Encounter (Signed)
Pt is calling to check on the status of this request. Please advise CB- [phone]

## 2021-02-05 NOTE — Telephone Encounter (Signed)
Patient is calling to follow up on RX refill request of medications prescribed by outside provider. It looks like patient was seen and Rx request has been refused by office. Patient is calling- needs explanation of why no longer prescribed these medications- not sure discussed at appointment.

## 2021-02-16 ENCOUNTER — Other Ambulatory Visit: Payer: Self-pay

## 2021-02-16 DIAGNOSIS — I25110 Atherosclerotic heart disease of native coronary artery with unstable angina pectoris: Secondary | ICD-10-CM

## 2021-02-16 DIAGNOSIS — F330 Major depressive disorder, recurrent, mild: Secondary | ICD-10-CM

## 2021-02-16 MED ORDER — CLOPIDOGREL BISULFATE 75 MG PO TABS: 75.0000 mg | ORAL_TABLET | Freq: Every day | ORAL | 1 refills | Status: DC

## 2021-02-16 MED ORDER — CITALOPRAM HYDROBROMIDE 40 MG PO TABS: 40.0000 mg | ORAL_TABLET | Freq: Every day | ORAL | 1 refills | Status: DC

## 2021-02-23 ENCOUNTER — Ambulatory Visit: Payer: 59 | Admitting: Nurse Practitioner

## 2021-02-23 ENCOUNTER — Telehealth: Payer: Self-pay | Admitting: Nurse Practitioner

## 2021-02-23 DIAGNOSIS — R600 Localized edema: Secondary | ICD-10-CM

## 2021-02-23 DIAGNOSIS — F330 Major depressive disorder, recurrent, mild: Secondary | ICD-10-CM

## 2021-02-23 DIAGNOSIS — I25110 Atherosclerotic heart disease of native coronary artery with unstable angina pectoris: Secondary | ICD-10-CM

## 2021-02-23 DIAGNOSIS — E119 Type 2 diabetes mellitus without complications: Secondary | ICD-10-CM

## 2021-02-23 NOTE — Telephone Encounter (Signed)
patient last seen by PCP on 02/05/2021 and states medication was changted and was never sent to the pharmacy. Patient does not know the name of the medications. Patient states this has been an on going affair and would like a follow up call from the nurse.

## 2021-02-26 MED ORDER — CITALOPRAM HYDROBROMIDE 40 MG PO TABS: 40.0000 mg | ORAL_TABLET | Freq: Every day | ORAL | 1 refills | Status: DC

## 2021-02-26 MED ORDER — GABAPENTIN 300 MG PO CAPS: 600.0000 mg | ORAL_CAPSULE | Freq: Every evening | ORAL | 1 refills | Status: DC | PRN

## 2021-02-26 MED ORDER — CLOPIDOGREL BISULFATE 75 MG PO TABS: 75.0000 mg | ORAL_TABLET | Freq: Every day | ORAL | 1 refills | Status: DC

## 2021-02-26 MED ORDER — FUROSEMIDE 20 MG PO TABS: ORAL_TABLET | ORAL | Status: DC

## 2021-02-26 NOTE — Telephone Encounter (Signed)
Patient notified

## 2021-02-26 NOTE — Telephone Encounter (Signed)
Looks like the medications may have not went through, could you resend them

## 2021-02-26 NOTE — Telephone Encounter (Signed)
Resent Plavix, Celexa, Gabapentin and lasix to the pharmacy.

## 2021-03-12 ENCOUNTER — Other Ambulatory Visit: Payer: Self-pay

## 2021-03-12 MED ORDER — LOSARTAN POTASSIUM 50 MG PO TABS: 50.0000 mg | ORAL_TABLET | Freq: Every day | ORAL | 1 refills | Status: DC

## 2021-04-13 LAB — HM DIABETES EYE EXAM

## 2021-04-23 ENCOUNTER — Other Ambulatory Visit: Payer: Self-pay

## 2021-04-23 DIAGNOSIS — K219 Gastro-esophageal reflux disease without esophagitis: Secondary | ICD-10-CM

## 2021-04-23 MED ORDER — PANTOPRAZOLE SODIUM 40 MG PO TBEC: 40.0000 mg | DELAYED_RELEASE_TABLET | Freq: Every day | ORAL | 0 refills | Status: DC

## 2021-04-23 MED ORDER — FAMOTIDINE 20 MG PO TABS: 20.0000 mg | ORAL_TABLET | Freq: Two times a day (BID) | ORAL | 0 refills | Status: DC

## 2021-05-04 ENCOUNTER — Ambulatory Visit: Payer: 59 | Admitting: Nurse Practitioner

## 2021-05-24 ENCOUNTER — Other Ambulatory Visit: Payer: Self-pay | Admitting: Nurse Practitioner

## 2021-05-24 DIAGNOSIS — K219 Gastro-esophageal reflux disease without esophagitis: Secondary | ICD-10-CM

## 2021-06-12 ENCOUNTER — Other Ambulatory Visit: Payer: Self-pay

## 2021-06-12 MED FILL — Semaglutide Soln Pen-inj 0.25 or 0.5 MG/DOSE (2 MG/1.5ML): SUBCUTANEOUS | 84 days supply | Qty: 4.5 | Fill #0 | Status: AC

## 2021-08-03 ENCOUNTER — Other Ambulatory Visit: Payer: Self-pay | Admitting: Nurse Practitioner

## 2021-08-03 DIAGNOSIS — K219 Gastro-esophageal reflux disease without esophagitis: Secondary | ICD-10-CM

## 2021-10-16 NOTE — Progress Notes (Signed)
BP (!) 161/82   Pulse 68   Temp 97.8 F (36.6 C)   Ht 5\' 10"  (1.778 m)   SpO2 98%   BMI 39.31 kg/m    Subjective:    Patient ID: Steven Vang, male    DOB: January 19, 1956, 65 y.o.   MRN: [id number]  HPI: Steven Vang is a 65 y.o. male presenting on 10/17/2021 for comprehensive medical examination. Current medical complaints include: numbness and tingling in hands and feet  He currently lives with: Interim Problems from his last visit: yes  HYPERTENSION / HYPERLIPIDEMIA Satisfied with current treatment? no- Patient has not been taking most of his medication  Duration of hypertension: years BP monitoring frequency: not checking BP range:  BP medication side effects: no Past BP meds:  Imdur and Metoprolol and losartan (cozaar) Duration of hyperlipidemia: years Cholesterol medication side effects: no Cholesterol supplements: none Past cholesterol medications: Pravastatin- not currently taking Medication compliance: fair compliance Aspirin: no Recent stressors: no Recurrent headaches: no Visual changes: no Palpitations: no Dyspnea: no Chest pain: no Lower extremity edema: no Dizzy/lightheaded: no  DIABETES Hypoglycemic episodes:no Polydipsia/polyuria: no Visual disturbance: no Chest pain: no Paresthesias: no Glucose Monitoring: no  Accucheck frequency: Not Checking  Fasting glucose:  Post prandial:  Evening:  Before meals: Taking Insulin?: no  Long acting insulin:  Short acting insulin: Blood Pressure Monitoring: not checking Retinal Examination: Not up to Date Foot Exam: Up to Date Diabetic Education: Not Completed Pneumovax: Not up to Date Influenza: Up to Date Aspirin: no  ANXIETY/DEPRESSION Patient states his mood is well controlled.  Denies concerns at visit today.   NUMBNESS/ FOOT PAIN Patient has not been taking his Gabapentin.  He has been out of it.  States his foot pain has been worse lately.    Depression Screen done today and results  listed below:  Depression screen [facility] 2/9 10/17/2021 11/22/2020 06/23/2020 03/03/2020 04/11/2015  Decreased Interest 1 0 0 - 3  Down, Depressed, Hopeless 0 0 0 - 2  PHQ - 2 Score 1 0 0 - 5  Altered sleeping 1 3 0 2 2  Tired, decreased energy 1 3 0 3 3  Change in appetite 2 0 2 1 2   Feeling bad or failure about yourself  0 0 0 1 2  Trouble concentrating 0 3 1 2 3   Moving slowly or fidgety/restless 1 0 1 0 1  Suicidal thoughts 0 0 0 0 0  PHQ-9 Score 6 9 4  - 18  Difficult doing work/chores - Somewhat difficult Somewhat difficult - Very difficult    The patient does not have a history of falls. I did complete a risk assessment for falls. A plan of care for falls was documented.   Past Medical History:  Past Medical History:  Diagnosis Date   Abnormal EKG    Anxiety disorder    CAD (coronary artery disease)    CHF (congestive heart failure) (HCC)    Chronic systolic heart failure (HCC)    Depression    Diabetes mellitus (HCC)    GERD (gastroesophageal reflux disease)    Hypertension    Lipoma of head 03/31/2020   Mixed hyperlipidemia    Non morbid obesity due to excess calories     Surgical History:  Past Surgical History:  Procedure Laterality Date   ANKLE FRACTURE SURGERY Right    CARDIAC CATHETERIZATION     CHOLECYSTECTOMY     CORONARY STENT PLACEMENT  2015   3 stents   HAND  SURGERY Right    LEFT HEART CATH AND CORONARY ANGIOGRAPHY Left 06/02/2020   Procedure: LEFT HEART CATH AND CORONARY ANGIOGRAPHY;  Surgeon: Dionisio David, MD;  Location: [facility];  Service: Cardiovascular;  Laterality: Left;    Medications:  Current Outpatient Medications on File Prior to Visit  Medication Sig   aspirin 81 MG EC tablet Take 1 tablet by mouth daily.   fluticasone (FLONASE) 50 MCG/ACT nasal spray Place 2 sprays into both nostrils daily.   fluticasone (FLONASE) 50 MCG/ACT nasal spray INSTILL 2 SPRAYS INTO EACH NOSTRIL EVERY DAY   Semaglutide,0.25 or 0.5MG /DOS, 2 MG/1.5ML SOPN  INJECT 0.5MG  INTO THE SKIN ONCE A WEEK AFTER STARTER DOSE OF 0.25MG  ONCE WEEKLY FOR 4 WEEKS   No current facility-administered medications on file prior to visit.    Allergies:  No Known Allergies  Social History:  Social History   Socioeconomic History   Marital status: Married    Spouse name: Not on file   Number of children: Not on file   Years of education: Not on file   Highest education level: Not on file  Occupational History   Not on file  Tobacco Use   Smoking status: Former    Types: Cigarettes    Quit date: 1990    Years since quitting: 32.8   Smokeless tobacco: Never  Vaping Use   Vaping Use: Never used  Substance and Sexual Activity   Alcohol use: Not Currently   Drug use: Never   Sexual activity: Not on file  Other Topics Concern   Not on file  Social History Narrative   Not on file   Social Determinants of Health   Financial Resource Strain: Not on file  Food Insecurity: Not on file  Transportation Needs: Not on file  Physical Activity: Not on file  Stress: Not on file  Social Connections: Not on file  Intimate Partner Violence: Not on file   Social History   Tobacco Use  Smoking Status Former   Types: Cigarettes   Quit date: 1990   Years since quitting: 32.8  Smokeless Tobacco Never   Social History   Substance and Sexual Activity  Alcohol Use Not Currently    Family History:  Family History  Problem Relation Age of Onset   CAD Mother    Diabetes Mother    Colon cancer Father    Breast cancer Paternal Grandmother    Stomach cancer Paternal Grandfather     Past medical history, surgical history, medications, allergies, family history and social history reviewed with patient today and changes made to appropriate areas of the chart.   Review of Systems  Eyes:  Negative for blurred vision and double vision.  Respiratory:  Negative for shortness of breath.   Cardiovascular:  Negative for chest pain, palpitations and leg swelling.   Musculoskeletal:        Foot pain  Neurological:  Positive for dizziness. Negative for headaches.  All other ROS negative except what is listed above and in the HPI.      Objective:    BP (!) 161/82   Pulse 68   Temp 97.8 F (36.6 C)   Ht 5\' 10"  (1.778 m)   SpO2 98%   BMI 39.31 kg/m   Wt Readings from Last 3 Encounters:  02/05/21 274 lb (124.3 kg)  01/26/21 274 lb 12.8 oz (124.6 kg)  11/22/20 254 lb 12.8 oz (115.6 kg)    Physical Exam Vitals and nursing note reviewed.  Constitutional:      General: He is not in acute distress.    Appearance: Normal appearance. He is obese. He is not ill-appearing, toxic-appearing or diaphoretic.  HENT:     Head: Normocephalic.     Right Ear: Tympanic membrane, ear canal and external ear normal.     Left Ear: Tympanic membrane, ear canal and external ear normal.     Nose: Nose normal. No congestion or rhinorrhea.     Mouth/Throat:     Mouth: Mucous membranes are moist.  Eyes:     General:        Right eye: No discharge.        Left eye: No discharge.     Extraocular Movements: Extraocular movements intact.     Conjunctiva/sclera: Conjunctivae normal.     Pupils: Pupils are equal, round, and reactive to light.  Cardiovascular:     Rate and Rhythm: Normal rate and regular rhythm.     Heart sounds: No murmur heard. Pulmonary:     Effort: Pulmonary effort is normal. No respiratory distress.     Breath sounds: Normal breath sounds. No wheezing, rhonchi or rales.  Abdominal:     General: Abdomen is flat. Bowel sounds are normal. There is no distension.     Palpations: Abdomen is soft.     Tenderness: There is no abdominal tenderness. There is no guarding.  Musculoskeletal:     Cervical back: Normal range of motion and neck supple.  Skin:    General: Skin is warm and dry.     Capillary Refill: Capillary refill takes less than 2 seconds.  Neurological:     General: No focal deficit present.     Mental Status: He is alert and oriented  to person, place, and time.     Cranial Nerves: No cranial nerve deficit.     Motor: No weakness.     Deep Tendon Reflexes: Reflexes normal.  Psychiatric:        Mood and Affect: Mood normal.        Behavior: Behavior normal.        Thought Content: Thought content normal.        Judgment: Judgment normal.    Results for orders placed or performed in visit on 10/17/21  Urinalysis, Routine w reflex microscopic  Result Value Ref Range   Specific Gravity, UA 1.015 1.005 - 1.030   pH, UA 5.5 5.0 - 7.5   Color, UA Yellow Yellow   Appearance Ur Clear Clear   Leukocytes,UA Negative Negative   Protein,UA Negative Negative/Trace   Glucose, UA Negative Negative   Ketones, UA Negative Negative   RBC, UA Negative Negative   Bilirubin, UA Negative Negative   Urobilinogen, Ur 0.2 0.2 - 1.0 mg/dL   Nitrite, UA Negative Negative      Assessment & Plan:   Problem List Items Addressed This Visit       Cardiovascular and Mediastinum   Coronary artery disease    Chronic. Patient has not been on his Plavix since March.  He has been taking ASA daily.  Will refill medication during visit today.  Patient has not been on Pravastatin either.  Will refill his statin.  Labs ordered today.       Relevant Medications   clopidogrel (PLAVIX) 75 MG tablet   furosemide (LASIX) 20 MG tablet   isosorbide mononitrate (IMDUR) 30 MG 24 hr tablet   losartan (COZAAR) 50 MG tablet   metoprolol succinate (TOPROL-XL) 50 MG 24  hr tablet   pravastatin (PRAVACHOL) 80 MG tablet   Essential hypertension    Chronic.  Uncontrolled.  Due to patient being off most of his medications for several months. Will resume gradually.  Refilled today. Discussed with patient how to gradually add medications back to regimen.       Relevant Medications   furosemide (LASIX) 20 MG tablet   isosorbide mononitrate (IMDUR) 30 MG 24 hr tablet   losartan (COZAAR) 50 MG tablet   metoprolol succinate (TOPROL-XL) 50 MG 24 hr tablet    pravastatin (PRAVACHOL) 80 MG tablet   Aortic atherosclerosis (HCC)    Chronic. Patient has not been on his Plavix since March.  He has been taking ASA daily.  Will refill medication during visit today.  Patient has not been on Pravastatin either.  Will refill his statin.  Labs ordered today.       Relevant Medications   furosemide (LASIX) 20 MG tablet   isosorbide mononitrate (IMDUR) 30 MG 24 hr tablet   losartan (COZAAR) 50 MG tablet   metoprolol succinate (TOPROL-XL) 50 MG 24 hr tablet   pravastatin (PRAVACHOL) 80 MG tablet   Other Relevant Orders   Lipid panel   CHF (congestive heart failure) ([place])    Patient has not been on Lasix, Imdur, Metoprolol, Losartan, or Pravastatin.  Will refill medications.  Labs ordered today.  Will have patient follow up in 1 month.  Patient should also see Cardiology.       Relevant Medications   furosemide (LASIX) 20 MG tablet   isosorbide mononitrate (IMDUR) 30 MG 24 hr tablet   losartan (COZAAR) 50 MG tablet   metoprolol succinate (TOPROL-XL) 50 MG 24 hr tablet   pravastatin (PRAVACHOL) 80 MG tablet   RESOLVED: Chronic systolic heart failure (HCC)   Relevant Medications   furosemide (LASIX) 20 MG tablet   isosorbide mononitrate (IMDUR) 30 MG 24 hr tablet   losartan (COZAAR) 50 MG tablet   metoprolol succinate (TOPROL-XL) 50 MG 24 hr tablet   pravastatin (PRAVACHOL) 80 MG tablet     Digestive   GERD (gastroesophageal reflux disease)    Continue with Famotidine.  Well controlled on current medication regimen.  Refills sent today.       Relevant Medications   famotidine (PEPCID) 20 MG tablet     Endocrine   Hyperlipidemia associated with type 2 diabetes mellitus (HCC)    Chronic.  Not currently taking Pravastatin since March.  Refills sent today.  Labs ordered today.        Relevant Medications   furosemide (LASIX) 20 MG tablet   isosorbide mononitrate (IMDUR) 30 MG 24 hr tablet   losartan (COZAAR) 50 MG tablet   metoprolol succinate  (TOPROL-XL) 50 MG 24 hr tablet   pravastatin (PRAVACHOL) 80 MG tablet   Other Relevant Orders   Lipid panel   Type 2 diabetes mellitus with diabetic neuropathy, unspecified (HCC)    Chronic. Uncontrolled.  Patient has not been taking the Gabapentin since March. States his feet are sore and have numbness.  Refills sent today.  Follow up in 1 month to reevaluate symptoms.       Relevant Medications   gabapentin (NEURONTIN) 300 MG capsule   losartan (COZAAR) 50 MG tablet   pravastatin (PRAVACHOL) 80 MG tablet   Other Relevant Orders   HgB A1c   Microalbumin, Urine Waived     Other   Anxiety disorder    Chronic.  Controlled.  Continue with current medication regimen  on Celexa 40mg  daily.  Refills sent today.  Labs ordered today.  Return to clinic in 6 months for reevaluation.  Call sooner if concerns arise.        Relevant Medications   citalopram (CELEXA) 40 MG tablet   traZODone (DESYREL) 50 MG tablet   Mild episode of recurrent major depressive disorder (HCC)    Chronic.  Controlled.  Continue with current medication regimen on Celexa 40mg  daily.  Refills sent today.  Labs ordered today.  Return to clinic in 6 months for reevaluation.  Call sooner if concerns arise.       Relevant Medications   citalopram (CELEXA) 40 MG tablet   traZODone (DESYREL) 50 MG tablet   Other Visit Diagnoses     Annual physical exam    -  Primary   Health maintaneance reviewed during visit today. Flu shot given. Labs ordered today.    Relevant Orders   TSH   PSA   CBC with Differential/Platelet   Comprehensive metabolic panel   Urinalysis, Routine w reflex microscopic (Completed)   Lower extremity edema       Ongoing.  Not well controlled.  Patient has been off of Lasix. Will restart lasix.  Recommend patient see Cardiologist.     Relevant Medications   furosemide (LASIX) 20 MG tablet   Screening for colon cancer       Relevant Orders   Ambulatory referral to Gastroenterology   Encounter for  hepatitis C screening test for low risk patient       Relevant Orders   Hepatitis C Antibody   Need for influenza vaccination       Relevant Orders   Flu Vaccine QUAD 6+ mos PF IM (Fluarix Quad PF) (Completed)        Discussed aspirin prophylaxis for myocardial infarction prevention and decision was made to continue ASA  LABORATORY TESTING:  Health maintenance labs ordered today as discussed above.   The natural history of prostate cancer and ongoing controversy regarding screening and potential treatment outcomes of prostate cancer has been discussed with the patient. The meaning of a false positive PSA and a false negative PSA has been discussed. He indicates understanding of the limitations of this screening test and wishes to proceed with screening PSA testing.   IMMUNIZATIONS:   - Tdap: Tetanus vaccination status reviewed: thinks he already received it at the hospital. - Influenza: Administered today - Pneumovax: Not applicable - Prevnar: Not applicable - HPV: Not applicable - Zostavax vaccine:  got the first one and does not want to get the second  SCREENING: - Colonoscopy: Ordered today  Discussed with patient purpose of the colonoscopy is to detect colon cancer at curable precancerous or early stages   - AAA Screening: Not applicable  -Hearing Test: Not applicable  -Spirometry: Not applicable   PATIENT COUNSELING:    Sexuality: Discussed sexually transmitted diseases, partner selection, use of condoms, avoidance of unintended pregnancy  and contraceptive alternatives.   Advised to avoid cigarette smoking.  I discussed with the patient that most people either abstain from alcohol or drink within safe limits (<=14/week and <=4 drinks/occasion for males, <=7/weeks and <= 3 drinks/occasion for females) and that the risk for alcohol disorders and other health effects rises proportionally with the number of drinks per week and how often a drinker exceeds daily  limits.  Discussed cessation/primary prevention of drug use and availability of treatment for abuse.   Diet: Encouraged to adjust caloric intake to maintain  or  achieve ideal body weight, to reduce intake of dietary saturated fat and total fat, to limit sodium intake by avoiding high sodium foods and not adding table salt, and to maintain adequate dietary potassium and calcium preferably from fresh fruits, vegetables, and low-fat dairy products.    stressed the importance of regular exercise  Injury prevention: Discussed safety belts, safety helmets, smoke detector, smoking near bedding or upholstery.   Dental health: Discussed importance of regular tooth brushing, flossing, and dental visits.   Follow up plan: NEXT PREVENTATIVE PHYSICAL DUE IN 1 YEAR. Return in about 1 month (around 11/16/2021) for BP Check.

## 2021-10-17 ENCOUNTER — Ambulatory Visit (INDEPENDENT_AMBULATORY_CARE_PROVIDER_SITE_OTHER): Payer: 59 | Admitting: Nurse Practitioner

## 2021-10-17 ENCOUNTER — Other Ambulatory Visit: Payer: Self-pay

## 2021-10-17 ENCOUNTER — Encounter: Payer: Self-pay | Admitting: Nurse Practitioner

## 2021-10-17 VITALS — BP 161/82 | HR 68 | Temp 97.8°F | Ht 70.0 in

## 2021-10-17 DIAGNOSIS — Z23 Encounter for immunization: Secondary | ICD-10-CM | POA: Diagnosis not present

## 2021-10-17 DIAGNOSIS — E785 Hyperlipidemia, unspecified: Secondary | ICD-10-CM

## 2021-10-17 DIAGNOSIS — I25110 Atherosclerotic heart disease of native coronary artery with unstable angina pectoris: Secondary | ICD-10-CM

## 2021-10-17 DIAGNOSIS — I509 Heart failure, unspecified: Secondary | ICD-10-CM | POA: Diagnosis not present

## 2021-10-17 DIAGNOSIS — I700 Atherosclerosis of aorta: Secondary | ICD-10-CM

## 2021-10-17 DIAGNOSIS — Z1159 Encounter for screening for other viral diseases: Secondary | ICD-10-CM | POA: Diagnosis not present

## 2021-10-17 DIAGNOSIS — R600 Localized edema: Secondary | ICD-10-CM

## 2021-10-17 DIAGNOSIS — E1169 Type 2 diabetes mellitus with other specified complication: Secondary | ICD-10-CM | POA: Diagnosis not present

## 2021-10-17 DIAGNOSIS — Z1211 Encounter for screening for malignant neoplasm of colon: Secondary | ICD-10-CM

## 2021-10-17 DIAGNOSIS — I5022 Chronic systolic (congestive) heart failure: Secondary | ICD-10-CM

## 2021-10-17 DIAGNOSIS — F330 Major depressive disorder, recurrent, mild: Secondary | ICD-10-CM | POA: Diagnosis not present

## 2021-10-17 DIAGNOSIS — I10 Essential (primary) hypertension: Secondary | ICD-10-CM

## 2021-10-17 DIAGNOSIS — F411 Generalized anxiety disorder: Secondary | ICD-10-CM

## 2021-10-17 DIAGNOSIS — K219 Gastro-esophageal reflux disease without esophagitis: Secondary | ICD-10-CM

## 2021-10-17 DIAGNOSIS — E1140 Type 2 diabetes mellitus with diabetic neuropathy, unspecified: Secondary | ICD-10-CM | POA: Diagnosis not present

## 2021-10-17 DIAGNOSIS — Z0000 Encounter for general adult medical examination without abnormal findings: Secondary | ICD-10-CM

## 2021-10-17 LAB — URINALYSIS, ROUTINE W REFLEX MICROSCOPIC
Bilirubin, UA: NEGATIVE
Glucose, UA: NEGATIVE
Ketones, UA: NEGATIVE
Leukocytes,UA: NEGATIVE
Nitrite, UA: NEGATIVE
Protein,UA: NEGATIVE
RBC, UA: NEGATIVE
Specific Gravity, UA: 1.015 (ref 1.005–1.030)
Urobilinogen, Ur: 0.2 mg/dL (ref 0.2–1.0)
pH, UA: 5.5 (ref 5.0–7.5)

## 2021-10-17 MED ORDER — ISOSORBIDE MONONITRATE ER 30 MG PO TB24
ORAL_TABLET | ORAL | 4 refills | Status: DC
  Filled 2021-10-17: qty 30, 30d supply, fill #0
  Filled 2022-06-28: qty 30, 30d supply, fill #1
  Filled 2022-07-29 – 2022-08-08 (×2): qty 30, 30d supply, fill #2

## 2021-10-17 MED ORDER — LOSARTAN POTASSIUM 50 MG PO TABS
50.0000 mg | ORAL_TABLET | Freq: Every day | ORAL | 1 refills | Status: DC
  Filled 2021-10-17: qty 90, 90d supply, fill #0
  Filled 2022-04-15: qty 90, 90d supply, fill #1

## 2021-10-17 MED ORDER — PRAVASTATIN SODIUM 80 MG PO TABS
80.0000 mg | ORAL_TABLET | Freq: Every day | ORAL | 1 refills | Status: DC
  Filled 2021-10-17: qty 90, 90d supply, fill #0

## 2021-10-17 MED ORDER — TRAZODONE HCL 50 MG PO TABS
ORAL_TABLET | Freq: Every evening | ORAL | 1 refills | Status: DC | PRN
  Filled 2021-10-17: qty 90, 90d supply, fill #0
  Filled 2022-04-15: qty 90, 90d supply, fill #1

## 2021-10-17 MED ORDER — CLOPIDOGREL BISULFATE 75 MG PO TABS: 75.0000 mg | ORAL_TABLET | Freq: Every day | ORAL | 1 refills | Status: DC

## 2021-10-17 MED ORDER — GABAPENTIN 300 MG PO CAPS
600.0000 mg | ORAL_CAPSULE | Freq: Every evening | ORAL | 1 refills | Status: DC | PRN
Start: 2021-10-17 — End: 2021-10-18

## 2021-10-17 MED ORDER — CITALOPRAM HYDROBROMIDE 40 MG PO TABS
40.0000 mg | ORAL_TABLET | Freq: Every day | ORAL | 1 refills | Status: DC
Start: 2021-10-17 — End: 2022-04-19
  Filled 2021-10-17: qty 90, 90d supply, fill #0
  Filled 2022-04-15: qty 90, 90d supply, fill #1

## 2021-10-17 MED ORDER — FAMOTIDINE 20 MG PO TABS
20.0000 mg | ORAL_TABLET | Freq: Two times a day (BID) | ORAL | 1 refills | Status: DC
  Filled 2021-10-17: qty 180, 90d supply, fill #0

## 2021-10-17 MED ORDER — METOPROLOL SUCCINATE ER 50 MG PO TB24
50.0000 mg | ORAL_TABLET | Freq: Every day | ORAL | 1 refills | Status: DC
  Filled 2021-10-17: qty 90, 90d supply, fill #0
  Filled 2022-04-15: qty 90, 90d supply, fill #1

## 2021-10-17 MED ORDER — FUROSEMIDE 20 MG PO TABS: ORAL_TABLET | ORAL | 1 refills | Status: DC

## 2021-10-17 NOTE — Assessment & Plan Note (Signed)
Chronic.  Not currently taking Pravastatin since March.  Refills sent today.  Labs ordered today.

## 2021-10-17 NOTE — Assessment & Plan Note (Signed)
Chronic. Uncontrolled.  Patient has not been taking the Gabapentin since March. States his feet are sore and have numbness.  Refills sent today.  Follow up in 1 month to reevaluate symptoms.

## 2021-10-17 NOTE — Assessment & Plan Note (Signed)
Patient has not been on Lasix, Imdur, Metoprolol, Losartan, or Pravastatin.  Will refill medications.  Labs ordered today.  Will have patient follow up in 1 month.  Patient should also see Cardiology.

## 2021-10-17 NOTE — Assessment & Plan Note (Signed)
Chronic. Patient has not been on his Plavix since March.  He has been taking ASA daily.  Will refill medication during visit today.  Patient has not been on Pravastatin either.  Will refill his statin.  Labs ordered today.

## 2021-10-17 NOTE — Assessment & Plan Note (Signed)
Continue with Famotidine.  Well controlled on current medication regimen.  Refills sent today.

## 2021-10-17 NOTE — Assessment & Plan Note (Signed)
Chronic.  Controlled.  Continue with current medication regimen on Celexa 40mg  daily.  Refills sent today.  Labs ordered today.  Return to clinic in 6 months for reevaluation.  Call sooner if concerns arise.

## 2021-10-17 NOTE — Assessment & Plan Note (Addendum)
Chronic.  Uncontrolled.  Due to patient being off most of his medications for several months. Will resume gradually.  Refilled today. Discussed with patient how to gradually add medications back to regimen.

## 2021-10-18 ENCOUNTER — Other Ambulatory Visit: Payer: Self-pay | Admitting: Nurse Practitioner

## 2021-10-18 ENCOUNTER — Other Ambulatory Visit: Payer: Self-pay

## 2021-10-18 DIAGNOSIS — I25110 Atherosclerotic heart disease of native coronary artery with unstable angina pectoris: Secondary | ICD-10-CM

## 2021-10-18 DIAGNOSIS — E1140 Type 2 diabetes mellitus with diabetic neuropathy, unspecified: Secondary | ICD-10-CM

## 2021-10-18 DIAGNOSIS — R600 Localized edema: Secondary | ICD-10-CM

## 2021-10-18 LAB — CBC WITH DIFFERENTIAL/PLATELET
Basophils Absolute: 0.1 10*3/uL (ref 0.0–0.2)
Basos: 2 %
EOS (ABSOLUTE): 0.5 10*3/uL — ABNORMAL HIGH (ref 0.0–0.4)
Eos: 8 %
Hematocrit: 44.1 % (ref 37.5–51.0)
Hemoglobin: 14.8 g/dL (ref 13.0–17.7)
Immature Grans (Abs): 0 10*3/uL (ref 0.0–0.1)
Immature Granulocytes: 1 %
Lymphocytes Absolute: 2 10*3/uL (ref 0.7–3.1)
Lymphs: 28 %
MCH: 26.4 pg — ABNORMAL LOW (ref 26.6–33.0)
MCHC: 33.6 g/dL (ref 31.5–35.7)
MCV: 79 fL (ref 79–97)
Monocytes Absolute: 0.5 10*3/uL (ref 0.1–0.9)
Monocytes: 7 %
Neutrophils Absolute: 3.9 10*3/uL (ref 1.4–7.0)
Neutrophils: 54 %
Platelets: 163 10*3/uL (ref 150–450)
RBC: 5.61 x10E6/uL (ref 4.14–5.80)
RDW: 13.9 % (ref 11.6–15.4)
WBC: 7.2 10*3/uL (ref 3.4–10.8)

## 2021-10-18 LAB — COMPREHENSIVE METABOLIC PANEL
ALT: 20 IU/L (ref 0–44)
AST: 25 IU/L (ref 0–40)
Albumin/Globulin Ratio: 1.7 (ref 1.2–2.2)
Albumin: 4.5 g/dL (ref 3.8–4.8)
Alkaline Phosphatase: 100 IU/L (ref 44–121)
BUN/Creatinine Ratio: 8 — ABNORMAL LOW (ref 10–24)
BUN: 10 mg/dL (ref 8–27)
Bilirubin Total: 0.3 mg/dL (ref 0.0–1.2)
CO2: 24 mmol/L (ref 20–29)
Calcium: 9.5 mg/dL (ref 8.6–10.2)
Chloride: 104 mmol/L (ref 96–106)
Creatinine, Ser: 1.21 mg/dL (ref 0.76–1.27)
Globulin, Total: 2.7 g/dL (ref 1.5–4.5)
Glucose: 136 mg/dL — ABNORMAL HIGH (ref 70–99)
Potassium: 4.6 mmol/L (ref 3.5–5.2)
Sodium: 140 mmol/L (ref 134–144)
Total Protein: 7.2 g/dL (ref 6.0–8.5)
eGFR: 67 mL/min/{1.73_m2} (ref >59)

## 2021-10-18 LAB — LIPID PANEL
Chol/HDL Ratio: 7.2 ratio — ABNORMAL HIGH (ref 0.0–5.0)
Cholesterol, Total: 230 mg/dL — ABNORMAL HIGH (ref 100–199)
HDL: 32 mg/dL — ABNORMAL LOW (ref >39)
LDL Chol Calc (NIH): 144 mg/dL — ABNORMAL HIGH (ref 0–99)
Triglycerides: 294 mg/dL — ABNORMAL HIGH (ref 0–149)
VLDL Cholesterol Cal: 54 mg/dL — ABNORMAL HIGH (ref 5–40)

## 2021-10-18 LAB — HEMOGLOBIN A1C
Est. average glucose Bld gHb Est-mCnc: 166 mg/dL
Hgb A1c MFr Bld: 7.4 % — ABNORMAL HIGH (ref 4.8–5.6)

## 2021-10-18 LAB — PSA: Prostate Specific Ag, Serum: 2.6 ng/mL (ref 0.0–4.0)

## 2021-10-18 LAB — HEPATITIS C ANTIBODY: Hep C Virus Ab: <0.1 s/co ratio (ref 0.0–0.9)

## 2021-10-18 LAB — TSH: TSH: 2.57 u[IU]/mL (ref 0.450–4.500)

## 2021-10-18 MED ORDER — CLOPIDOGREL BISULFATE 75 MG PO TABS
75.0000 mg | ORAL_TABLET | Freq: Every day | ORAL | 1 refills | Status: DC
  Filled 2021-10-18: qty 90, 90d supply, fill #0
  Filled 2022-01-31: qty 90, 90d supply, fill #1

## 2021-10-18 MED ORDER — GABAPENTIN 300 MG PO CAPS
600.0000 mg | ORAL_CAPSULE | Freq: Every evening | ORAL | 1 refills | Status: DC | PRN
  Filled 2021-10-18: qty 180, 90d supply, fill #0
  Filled 2022-04-15: qty 180, 90d supply, fill #1

## 2021-10-18 MED ORDER — FUROSEMIDE 20 MG PO TABS
ORAL_TABLET | ORAL | 1 refills | Status: DC
  Filled 2021-10-18: qty 90, 90d supply, fill #0
  Filled 2022-04-15: qty 90, 90d supply, fill #1

## 2021-10-18 MED ORDER — SEMAGLUTIDE(0.25 OR 0.5MG/DOS) 2 MG/1.5ML ~~LOC~~ SOPN
PEN_INJECTOR | SUBCUTANEOUS | 4 refills | Status: DC
  Filled 2021-10-18: qty 1.5, 28d supply, fill #0
  Filled 2021-12-05: qty 1.5, 28d supply, fill #1
  Filled 2022-01-16: qty 1.5, 28d supply, fill #2
  Filled 2022-02-19: qty 1.5, 28d supply, fill #3
  Filled 2022-04-04 – 2022-04-16 (×3): qty 1.5, 28d supply, fill #4

## 2021-10-18 NOTE — Telephone Encounter (Signed)
Copied from [place] [id number]. Topic: Quick Communication - Rx Refill/Question >> Oct 18, 2021  9:12 AM Yvette Rack wrote: Pt stated the medications listed below were not sent to his pharmacy. The Rx either shows as class: print or no print and pt stated that the Semaglutide,0.25 or 0.5MG /DOS, 2 MG/1.5ML SOPN dose is incorrect    Medication: clopidogrel (PLAVIX) 75 MG tablet, furosemide (LASIX) 20 MG tablet, gabapentin (NEURONTIN) 300 MG capsule, and Semaglutide,0.25 or 0.5MG /DOS, 2 MG/1.5ML SOPN  Has the patient contacted their pharmacy? Yes.   (Agent: If no, request that the patient contact the pharmacy for the refill. If patient does not wish to contact the pharmacy document the reason why and proceed with request.) (Agent: If yes, when and what did the pharmacy advise?)  Preferred Pharmacy (with phone number or street name): [facility]  Phone: [phone]   Fax: [phone]  Has the patient been seen for an appointment in the last year OR does the patient have an upcoming appointment? Yes.    Agent: Please be advised that RX refills may take up to 3 business days. We ask that you follow-up with your pharmacy.

## 2021-10-18 NOTE — Progress Notes (Signed)
Please let patient know that his lab work shows that his cholesterol is elevated.  This is likely due to not being on the pravastatin.  He should resume taking that as discussed during his visit.  Patient's A1c increased from 6.5 to 7.4.  We should increase his dose of ozempic to 0.25mg  to 0.5mg  to help bring this back down.  I can send in the new prescription if patient agrees.  Other lab work looks good.  Follow up as discussed.

## 2021-10-18 NOTE — Telephone Encounter (Signed)
I called [facility] to see if they had gotten prescriptions for the Ozempic, Lasix, Neurontin and Plavix.   They had not received any of those. He is to be taking 0.5 mg of the Ozempic.  I resent these in to the pharmacy as they were marked as "print".  I sent them electronically.

## 2021-10-22 ENCOUNTER — Other Ambulatory Visit: Payer: Self-pay

## 2021-10-22 ENCOUNTER — Telehealth: Payer: Self-pay

## 2021-10-22 NOTE — Telephone Encounter (Signed)
-----   Message from Jon Billings, NP sent at 10/18/2021  8:41 AM EDT ----- Please let patient know that his lab work shows that his cholesterol is elevated.  This is likely due to not being on the pravastatin.  He should resume taking that as discussed during his visit.  Patient's A1c increased from 6.5 to 7.4.  We should increase his dose of ozempic to 0.25mg  to 0.5mg  to help bring this back down.  I can send in the new prescription if patient agrees.  Other lab work looks good.  Follow up as discussed.

## 2021-10-22 NOTE — Telephone Encounter (Signed)
Called patient to go over recent lab results with patient. Patient was upset stating he is fed up with our office as patient states he was without all of his medications for 6-8 months. Patient was informed that he was last seen in our office back in February. Patient was scheduled to follow up with Vance Peper, NP in March and patient cancelled the appointment. Patient was then scheduled for a 3 month follow up in May with Jon Billings, NP and patient cancelled the appointment. Patient states if those two appointments were cancelled, that he had a very good reason as to why they were cancelled. I explained to patient that with him missing those scheduled appointments could have mainly been the reason as to why his medications were not refilled. Patient was very upset and requested to speak with the practice manager as he was upset with our office because it was our fault as to why he has been 6-8 months without his medications. Placed patient on hold to transfer to our office manager and when went to transfer patient had hung up. Practice manager Alwyn Ren was notified.

## 2021-10-24 NOTE — Telephone Encounter (Signed)
[facility] requesting patient to return my call directly at [phone].

## 2021-10-24 NOTE — Telephone Encounter (Signed)
Spoke with patient regarding medication concerns. Patient wanted to know why provider only gave 30 days of medication. Explained to patient that provider requested that he follow up in one month and that he could discuss with the provider during that time. Patient stated that he likes Steven Vang as his provider and would keep his appointment scheduled for 11/16/21.

## 2021-11-09 ENCOUNTER — Other Ambulatory Visit: Payer: Self-pay

## 2021-11-09 MED ORDER — NA SULFATE-K SULFATE-MG SULF 17.5-3.13-1.6 GM/177ML PO SOLN
1.0000 | ORAL | 0 refills | Status: DC
  Filled 2021-11-09: qty 354, fill #0
  Filled 2021-11-26: qty 354, 1d supply, fill #0

## 2021-11-16 ENCOUNTER — Ambulatory Visit (INDEPENDENT_AMBULATORY_CARE_PROVIDER_SITE_OTHER): Payer: 59 | Admitting: Nurse Practitioner

## 2021-11-16 ENCOUNTER — Encounter: Payer: Self-pay | Admitting: Nurse Practitioner

## 2021-11-16 ENCOUNTER — Other Ambulatory Visit: Payer: Self-pay

## 2021-11-16 VITALS — BP 126/83 | HR 76 | Temp 98.0°F | Ht 70.0 in | Wt 284.1 lb

## 2021-11-16 DIAGNOSIS — I10 Essential (primary) hypertension: Secondary | ICD-10-CM

## 2021-11-16 NOTE — Progress Notes (Signed)
BP 126/83   Pulse 76   Temp 98 F (36.7 C) (Oral)   Ht 5' 10"  (1.778 m)   Wt 284 lb 2 oz (128.9 kg)   SpO2 98%   BMI 40.77 kg/m    Subjective:    Patient ID: Steven Vang, male    DOB: 07-13-1956, 65 y.o.   MRN: [id number]  HPI: Steven Vang is a 65 y.o. male  Chief Complaint  Patient presents with   Hypertension   Hyperlipidemia   Diabetes    Patient is here to follow up on labs and medications. Patient has no concerns or questions at this time.   HYPERTENSION Hypertension status: controlled  Satisfied with current treatment? yes Duration of hypertension: years BP monitoring frequency:   not checking BP range:  BP medication side effects:  no Medication compliance: excellent compliance Previous BP meds: metoprolol, lasix and losartan (cozaar) Aspirin: no Recurrent headaches: no Visual changes: no Palpitations: no Dyspnea: no Chest pain: no Lower extremity edema: no Dizzy/lightheaded: no   Relevant past medical, surgical, family and social history reviewed and updated as indicated. Interim medical history since our last visit reviewed. Allergies and medications reviewed and updated.  Review of Systems  Eyes:  Negative for visual disturbance.  Respiratory:  Negative for chest tightness and shortness of breath.   Cardiovascular:  Negative for chest pain, palpitations and leg swelling.  Neurological:  Negative for dizziness, light-headedness and headaches.   Per HPI unless specifically indicated above     Objective:    BP 126/83   Pulse 76   Temp 98 F (36.7 C) (Oral)   Ht 5' 10"  (1.778 m)   Wt 284 lb 2 oz (128.9 kg)   SpO2 98%   BMI 40.77 kg/m   Wt Readings from Last 3 Encounters:  11/16/21 284 lb 2 oz (128.9 kg)  02/05/21 274 lb (124.3 kg)  01/26/21 274 lb 12.8 oz (124.6 kg)    Physical Exam Vitals and nursing note reviewed.  Constitutional:      General: He is not in acute distress.    Appearance: Normal appearance. He is obese. He is  not ill-appearing, toxic-appearing or diaphoretic.  HENT:     Head: Normocephalic.     Right Ear: External ear normal.     Left Ear: External ear normal.     Nose: Nose normal. No congestion or rhinorrhea.     Mouth/Throat:     Mouth: Mucous membranes are moist.  Eyes:     General:        Right eye: No discharge.        Left eye: No discharge.     Extraocular Movements: Extraocular movements intact.     Conjunctiva/sclera: Conjunctivae normal.     Pupils: Pupils are equal, round, and reactive to light.  Cardiovascular:     Rate and Rhythm: Normal rate and regular rhythm.     Heart sounds: No murmur heard. Pulmonary:     Effort: Pulmonary effort is normal. No respiratory distress.     Breath sounds: Normal breath sounds. No wheezing, rhonchi or rales.  Abdominal:     General: Abdomen is flat. Bowel sounds are normal.  Musculoskeletal:     Cervical back: Normal range of motion and neck supple.  Skin:    General: Skin is warm and dry.     Capillary Refill: Capillary refill takes less than 2 seconds.  Neurological:     General: No focal deficit present.  Mental Status: He is alert and oriented to person, place, and time.  Psychiatric:        Mood and Affect: Mood normal.        Behavior: Behavior normal.        Thought Content: Thought content normal.        Judgment: Judgment normal.    Results for orders placed or performed in visit on 10/17/21  TSH  Result Value Ref Range   TSH 2.570 0.450 - 4.500 uIU/mL  PSA  Result Value Ref Range   Prostate Specific Ag, Serum 2.6 0.0 - 4.0 ng/mL  Lipid panel  Result Value Ref Range   Cholesterol, Total 230 (H) 100 - 199 mg/dL   Triglycerides 294 (H) 0 - 149 mg/dL   HDL 32 (L) >39 mg/dL   VLDL Cholesterol Cal 54 (H) 5 - 40 mg/dL   LDL Chol Calc (NIH) 144 (H) 0 - 99 mg/dL   Chol/HDL Ratio 7.2 (H) 0.0 - 5.0 ratio  CBC with Differential/Platelet  Result Value Ref Range   WBC 7.2 3.4 - 10.8 x10E3/uL   RBC 5.61 4.14 - 5.80  x10E6/uL   Hemoglobin 14.8 13.0 - 17.7 g/dL   Hematocrit 44.1 37.5 - 51.0 %   MCV 79 79 - 97 fL   MCH 26.4 (L) 26.6 - 33.0 pg   MCHC 33.6 31.5 - 35.7 g/dL   RDW 13.9 11.6 - 15.4 %   Platelets 163 150 - 450 x10E3/uL   Neutrophils 54 Not Estab. %   Lymphs 28 Not Estab. %   Monocytes 7 Not Estab. %   Eos 8 Not Estab. %   Basos 2 Not Estab. %   Neutrophils Absolute 3.9 1.4 - 7.0 x10E3/uL   Lymphocytes Absolute 2.0 0.7 - 3.1 x10E3/uL   Monocytes Absolute 0.5 0.1 - 0.9 x10E3/uL   EOS (ABSOLUTE) 0.5 (H) 0.0 - 0.4 x10E3/uL   Basophils Absolute 0.1 0.0 - 0.2 x10E3/uL   Immature Granulocytes 1 Not Estab. %   Immature Grans (Abs) 0.0 0.0 - 0.1 x10E3/uL  Comprehensive metabolic panel  Result Value Ref Range   Glucose 136 (H) 70 - 99 mg/dL   BUN 10 8 - 27 mg/dL   Creatinine, Ser 1.21 0.76 - 1.27 mg/dL   eGFR 67 >59 mL/min/1.73   BUN/Creatinine Ratio 8 (L) 10 - 24   Sodium 140 134 - 144 mmol/L   Potassium 4.6 3.5 - 5.2 mmol/L   Chloride 104 96 - 106 mmol/L   CO2 24 20 - 29 mmol/L   Calcium 9.5 8.6 - 10.2 mg/dL   Total Protein 7.2 6.0 - 8.5 g/dL   Albumin 4.5 3.8 - 4.8 g/dL   Globulin, Total 2.7 1.5 - 4.5 g/dL   Albumin/Globulin Ratio 1.7 1.2 - 2.2   Bilirubin Total 0.3 0.0 - 1.2 mg/dL   Alkaline Phosphatase 100 44 - 121 IU/L   AST 25 0 - 40 IU/L   ALT 20 0 - 44 IU/L  Urinalysis, Routine w reflex microscopic  Result Value Ref Range   Specific Gravity, UA 1.015 1.005 - 1.030   pH, UA 5.5 5.0 - 7.5   Color, UA Yellow Yellow   Appearance Ur Clear Clear   Leukocytes,UA Negative Negative   Protein,UA Negative Negative/Trace   Glucose, UA Negative Negative   Ketones, UA Negative Negative   RBC, UA Negative Negative   Bilirubin, UA Negative Negative   Urobilinogen, Ur 0.2 0.2 - 1.0 mg/dL   Nitrite, UA Negative Negative  HgB A1c  Result Value Ref Range   Hgb A1c MFr Bld 7.4 (H) 4.8 - 5.6 %   Est. average glucose Bld gHb Est-mCnc 166 mg/dL  Hepatitis C Antibody  Result Value Ref  Range   Hep C Virus Ab <0.1 0.0 - 0.9 s/co ratio      Assessment & Plan:   Problem List Items Addressed This Visit       Cardiovascular and Mediastinum   Essential hypertension - Primary    Chronic.  Improved.  Patient has restarted his Lasix, Losartan and Metoprolol.  Patient encouraged to continue with medication regimen. Recommend checking blood pressures at home daily. Keep a log and bring to next visit. Follow up in 5 months for reevaluation.  Call sooner if concerns arise.         Follow up plan: Return in about 5 months (around 04/16/2022) for HTN, HLD, DM2 FU.

## 2021-11-16 NOTE — Assessment & Plan Note (Signed)
Chronic.  Improved.  Patient has restarted his Lasix, Losartan and Metoprolol.  Patient encouraged to continue with medication regimen. Recommend checking blood pressures at home daily. Keep a log and bring to next visit. Follow up in 5 months for reevaluation.  Call sooner if concerns arise.

## 2021-11-26 ENCOUNTER — Other Ambulatory Visit: Payer: Self-pay

## 2021-11-28 ENCOUNTER — Encounter: Payer: Self-pay | Admitting: Gastroenterology

## 2021-11-28 ENCOUNTER — Other Ambulatory Visit: Payer: Self-pay

## 2021-11-28 ENCOUNTER — Ambulatory Visit: Payer: 59 | Admitting: Certified Registered Nurse Anesthetist

## 2021-11-28 ENCOUNTER — Encounter
Admission: RE | Disposition: A | Discharge status: Home / Self Care | Payer: Self-pay | Source: Home / Self Care | Attending: Gastroenterology

## 2021-11-28 ENCOUNTER — Ambulatory Visit
Admission: RE | Admit: 2021-11-28 | Discharge: 2021-11-28 | Disposition: A | Discharge status: Home / Self Care | Payer: 59 | Attending: Gastroenterology | Admitting: Gastroenterology

## 2021-11-28 DIAGNOSIS — K219 Gastro-esophageal reflux disease without esophagitis: Secondary | ICD-10-CM | POA: Diagnosis not present

## 2021-11-28 DIAGNOSIS — E119 Type 2 diabetes mellitus without complications: Secondary | ICD-10-CM | POA: Diagnosis not present

## 2021-11-28 DIAGNOSIS — I509 Heart failure, unspecified: Secondary | ICD-10-CM | POA: Insufficient documentation

## 2021-11-28 DIAGNOSIS — Z87891 Personal history of nicotine dependence: Secondary | ICD-10-CM | POA: Diagnosis not present

## 2021-11-28 DIAGNOSIS — K5790 Diverticulosis of intestine, part unspecified, without perforation or abscess without bleeding: Secondary | ICD-10-CM | POA: Diagnosis not present

## 2021-11-28 DIAGNOSIS — I5022 Chronic systolic (congestive) heart failure: Secondary | ICD-10-CM | POA: Diagnosis not present

## 2021-11-28 DIAGNOSIS — Z6841 Body Mass Index (BMI) 40.0 and over, adult: Secondary | ICD-10-CM | POA: Diagnosis not present

## 2021-11-28 DIAGNOSIS — K5730 Diverticulosis of large intestine without perforation or abscess without bleeding: Secondary | ICD-10-CM | POA: Diagnosis not present

## 2021-11-28 DIAGNOSIS — F32A Depression, unspecified: Secondary | ICD-10-CM | POA: Diagnosis not present

## 2021-11-28 DIAGNOSIS — Z86010 Personal history of colonic polyps: Secondary | ICD-10-CM | POA: Diagnosis not present

## 2021-11-28 DIAGNOSIS — F419 Anxiety disorder, unspecified: Secondary | ICD-10-CM | POA: Insufficient documentation

## 2021-11-28 DIAGNOSIS — K635 Polyp of colon: Secondary | ICD-10-CM | POA: Diagnosis not present

## 2021-11-28 DIAGNOSIS — Z955 Presence of coronary angioplasty implant and graft: Secondary | ICD-10-CM | POA: Diagnosis not present

## 2021-11-28 DIAGNOSIS — E782 Mixed hyperlipidemia: Secondary | ICD-10-CM | POA: Insufficient documentation

## 2021-11-28 DIAGNOSIS — I2510 Atherosclerotic heart disease of native coronary artery without angina pectoris: Secondary | ICD-10-CM | POA: Diagnosis not present

## 2021-11-28 DIAGNOSIS — Z800 Family history of malignant neoplasm of digestive organs: Secondary | ICD-10-CM | POA: Diagnosis not present

## 2021-11-28 DIAGNOSIS — D123 Benign neoplasm of transverse colon: Secondary | ICD-10-CM | POA: Diagnosis not present

## 2021-11-28 DIAGNOSIS — I110 Hypertensive heart disease with heart failure: Secondary | ICD-10-CM | POA: Insufficient documentation

## 2021-11-28 DIAGNOSIS — Z7189 Other specified counseling: Secondary | ICD-10-CM

## 2021-11-28 DIAGNOSIS — Z1211 Encounter for screening for malignant neoplasm of colon: Secondary | ICD-10-CM | POA: Diagnosis not present

## 2021-11-28 HISTORY — PX: COLONOSCOPY: SHX5424

## 2021-11-28 SURGERY — COLONOSCOPY
Anesthesia: General

## 2021-11-28 MED ORDER — SODIUM CHLORIDE 0.9 % IV SOLN: INTRAVENOUS | Status: DC

## 2021-11-28 MED ORDER — PROPOFOL 500 MG/50ML IV EMUL
INTRAVENOUS | Status: AC
  Filled 2021-11-28: qty 50

## 2021-11-28 MED ORDER — PROPOFOL 500 MG/50ML IV EMUL
INTRAVENOUS | Status: DC | PRN
Start: 2021-11-28 — End: 2021-11-28
  Administered 2021-11-28: 175 ug/kg/min via INTRAVENOUS

## 2021-11-28 MED ORDER — PROPOFOL 10 MG/ML IV BOLUS
INTRAVENOUS | Status: DC | PRN
  Administered 2021-11-28: 80 mg via INTRAVENOUS

## 2021-11-28 MED ORDER — LIDOCAINE HCL (CARDIAC) PF 100 MG/5ML IV SOSY
PREFILLED_SYRINGE | INTRAVENOUS | Status: DC | PRN
  Administered 2021-11-28: 50 mg via INTRAVENOUS

## 2021-11-28 NOTE — Op Note (Signed)
[facility]astroenter[facility] Patient Name: Steven Vang Procedure Date: 11/28/2021 11:09 AM MRN: [id number] Account #: [id number] Date of Birth: 1956-05-20 Admit Type: Outpatient Age: 65 Room: [facility] ENDO ROOM 4 Gender: Male Note Status: Finalized Instrument Name: Colonoscope [id number] Procedure:             Colonoscopy Indications:           Screening for colorectal malignant neoplasm Providers:             Lin Landsman MD, MD Referring MD:          Jon Billings (Referring MD) Medicines:             General Anesthesia Complications:         No immediate complications. Estimated blood loss: None. Procedure:             Pre-Anesthesia Assessment:                        - Prior to the procedure, a History and Physical was                         performed, and patient medications and allergies were                         reviewed. The patient is competent. The risks and                         benefits of the procedure and the sedation options and                         risks were discussed with the patient. All questions                         were answered and informed consent was obtained.                         Patient identification and proposed procedure were                         verified by the physician, the nurse, the                         anesthesiologist, the anesthetist and the technician                         in the pre-procedure area in the procedure room in the                         endoscopy suite. Mental Status Examination: alert and                         oriented. Airway Examination: normal oropharyngeal                         airway and neck mobility. Respiratory Examination:                         clear to auscultation. CV Examination: normal.  Prophylactic Antibiotics: The patient does not require                         prophylactic antibiotics. Prior Anticoagulants: The                          patient has taken Plavix (clopidogrel), last dose was                         3 days prior to procedure. ASA Grade Assessment: III -                         A patient with severe systemic disease. After                         reviewing the risks and benefits, the patient was                         deemed in satisfactory condition to undergo the                         procedure. The anesthesia plan was to use general                         anesthesia. Immediately prior to administration of                         medications, the patient was re-assessed for adequacy                         to receive sedatives. The heart rate, respiratory                         rate, oxygen saturations, blood pressure, adequacy of                         pulmonary ventilation, and response to care were                         monitored throughout the procedure. The physical                         status of the patient was re-assessed after the                         procedure.                        After obtaining informed consent, the colonoscope was                         passed under direct vision. Throughout the procedure,                         the patient's blood pressure, pulse, and oxygen                         saturations were monitored continuously. The  Colonoscope was introduced through the anus and                         advanced to the the terminal ileum, with                         identification of the appendiceal orifice and IC                         valve. The colonoscopy was performed without                         difficulty. The patient tolerated the procedure well.                         The quality of the bowel preparation was evaluated                         using the BBPS [facility] Bowel Preparation Scale) with                         scores of: Right Colon = 3, Transverse Colon = 3 and                         Left Colon = 3 (entire mucosa seen  well with no                         residual staining, small fragments of stool or opaque                         liquid). The total BBPS score equals 9. Findings:      The perianal and digital rectal examinations were normal. Pertinent       negatives include normal sphincter tone and no palpable rectal lesions.      The terminal ileum appeared normal.      A 5 mm polyp was found in the transverse colon. The polyp was sessile.       The polyp was removed with a cold snare. Resection and retrieval were       complete. Estimated blood loss: none.      Multiple diverticula were found in the recto-sigmoid colon, sigmoid       colon, descending colon, transverse colon and ascending colon. There was       no evidence of diverticular bleeding.      The retroflexed view of the distal rectum and anal verge was normal and       showed no anal or rectal abnormalities.      The exam was otherwise without abnormality. Impression:            - The examined portion of the ileum was normal.                        - One 5 mm polyp in the transverse colon, removed with                         a cold snare. Resected and retrieved.                        -  Severe diverticulosis in the recto-sigmoid colon, in                         the sigmoid colon, in the descending colon, in the                         transverse colon and in the ascending colon. There was                         no evidence of diverticular bleeding.                        - The distal rectum and anal verge are normal on                         retroflexion view.                        - The examination was otherwise normal. Recommendation:        - Discharge patient to home (with escort).                        - Resume previous diet today.                        - Continue present medications.                        - Await pathology results.                        - Repeat colonoscopy in 5 years for surveillance.                         - Resume Plavix (clopidogrel) at prior dose tomorrow.                         Refer to managing physician for further adjustment of                         therapy. Procedure Code(s):     --- Professional ---                        [phone], Colonoscopy, flexible; with removal of                         tumor(s), polyp(s), or other lesion(s) by snare                         technique Diagnosis Code(s):     --- Professional ---                        Z12.11, Encounter for screening for malignant neoplasm                         of colon                        K63.5, Polyp of colon  K57.30, Diverticulosis of large intestine without                         perforation or abscess without bleeding CPT copyright 2019 American Medical Association. All rights reserved. The codes documented in this report are preliminary and upon coder review may  be revised to meet current compliance requirements. Dr. Ulyess Mort Lin Landsman MD, MD 11/28/2021 11:33:26 AM This report has been signed electronically. Number of Addenda: 0 Note Initiated On: 11/28/2021 11:09 AM Scope Withdrawal Time: 0 hours 10 minutes 6 seconds  Total Procedure Duration: 0 hours 12 minutes 21 seconds  Estimated Blood Loss:  Estimated blood loss: none.      [facility]

## 2021-11-28 NOTE — Anesthesia Postprocedure Evaluation (Signed)
Anesthesia Post Note  Patient: Steven Vang  Procedure(s) Performed: COLONOSCOPY  Patient location during evaluation: Endoscopy Anesthesia Type: General Level of consciousness: awake and alert Pain management: pain level controlled Vital Signs Assessment: post-procedure vital signs reviewed and stable Respiratory status: spontaneous breathing, nonlabored ventilation, respiratory function stable and patient connected to nasal cannula oxygen Cardiovascular status: blood pressure returned to baseline and stable Postop Assessment: no apparent nausea or vomiting Anesthetic complications: no   No notable events documented.   Last Vitals:  Vitals:   11/28/21 1158 11/28/21 1208  BP:  133/84  Pulse: 71 64  Resp: 15 (!) 21  Temp:    SpO2: 94% 96%    Last Pain:  Vitals:   11/28/21 1208  TempSrc:   PainSc: 0-No pain                 Arita Miss

## 2021-11-28 NOTE — Anesthesia Preprocedure Evaluation (Signed)
Anesthesia Evaluation  Patient identified by MRN, date of birth, ID band Patient awake    Reviewed: Allergy & Precautions, NPO status , Patient's Chart, lab work & pertinent test results  History of Anesthesia Complications Negative for: history of anesthetic complications  Airway Mallampati: II  TM Distance: >3 FB Neck ROM: Full    Dental  (+) Upper Dentures, Partial Lower, Poor Dentition   Pulmonary neg pulmonary ROS, neg sleep apnea, neg COPD, Patient abstained from smoking.Not current smoker, former smoker,    Pulmonary exam normal breath sounds clear to auscultation       Cardiovascular Exercise Tolerance: Good METShypertension, + CAD, + Cardiac Stents and +CHF  (-) Past MI (-) dysrhythmias  Rhythm:Regular Rate:Normal - Systolic murmurs Able to walk two flights of stairs   Neuro/Psych PSYCHIATRIC DISORDERS Anxiety Depression  Neuromuscular disease    GI/Hepatic GERD  ,(+)     (-) substance abuse  ,   Endo/Other  diabetes  Renal/GU negative Renal ROS     Musculoskeletal   Abdominal   Peds  Hematology   Anesthesia Other Findings Past Medical History: No date: Abnormal EKG No date: Anxiety disorder No date: CAD (coronary artery disease) No date: CHF (congestive heart failure) (HCC) No date: Chronic systolic heart failure (HCC) No date: Depression No date: Diabetes mellitus (HCC) No date: GERD (gastroesophageal reflux disease) No date: Hypertension 03/31/2020: Lipoma of head No date: Mixed hyperlipidemia No date: Non morbid obesity due to excess calories  Reproductive/Obstetrics                             Anesthesia Physical Anesthesia Plan  ASA: 3  Anesthesia Plan: General   Post-op Pain Management: Minimal or no pain anticipated   Induction: Intravenous  PONV Risk Score and Plan: 2 and Propofol infusion, TIVA and Ondansetron  Airway Management Planned: Nasal  Cannula  Additional Equipment: None  Intra-op Plan:   Post-operative Plan:   Informed Consent: I have reviewed the patients History and Physical, chart, labs and discussed the procedure including the risks, benefits and alternatives for the proposed anesthesia with the patient or authorized representative who has indicated his/her understanding and acceptance.     Dental advisory given  Plan Discussed with: CRNA and Surgeon  Anesthesia Plan Comments: (Discussed risks of anesthesia with patient, including possibility of difficulty with spontaneous ventilation under anesthesia necessitating airway intervention, PONV, and rare risks such as cardiac or respiratory or neurological events, and allergic reactions. Discussed the role of CRNA in patient's perioperative care. Patient understands.)        Anesthesia Quick Evaluation

## 2021-11-28 NOTE — Anesthesia Procedure Notes (Signed)
Date/Time: 11/28/2021 11:14 AM Performed by: Johnna Acosta, CRNA Pre-anesthesia Checklist: Patient identified, Emergency Drugs available, Suction available, Patient being monitored and Timeout performed Patient Re-evaluated:Patient Re-evaluated prior to induction Oxygen Delivery Method: Nasal cannula Preoxygenation: Pre-oxygenation with 100% oxygen Induction Type: IV induction

## 2021-11-28 NOTE — H&P (Signed)
[name], MD [address]  [place]  [place], [place] [address]  Main: [phone]  Fax: [phone] Pager: [phone]  Primary Care Physician:  Jon Billings, NP Primary Gastroenterologist:  Dr. [name]  Pre-Procedure History & Physical: HPI:  Steven Vang is a 65 y.o. male is here for an colonoscopy.   Past Medical History:  Diagnosis Date   Abnormal EKG    Anxiety disorder    CAD (coronary artery disease)    CHF (congestive heart failure) (HCC)    Chronic systolic heart failure (HCC)    Depression    Diabetes mellitus (HCC)    GERD (gastroesophageal reflux disease)    Hypertension    Lipoma of head 03/31/2020   Mixed hyperlipidemia    Non morbid obesity due to excess calories     Past Surgical History:  Procedure Laterality Date   ANKLE FRACTURE SURGERY Right    CARDIAC CATHETERIZATION     CHOLECYSTECTOMY     CORONARY STENT PLACEMENT  2015   3 stents   HAND SURGERY Right    LEFT HEART CATH AND CORONARY ANGIOGRAPHY Left 06/02/2020   Procedure: LEFT HEART CATH AND CORONARY ANGIOGRAPHY;  Surgeon: Dionisio David, MD;  Location: [facility];  Service: Cardiovascular;  Laterality: Left;    Prior to Admission medications   Medication Sig Start Date End Date Taking? Authorizing Provider  aspirin 81 MG EC tablet Take 1 tablet by mouth daily.   Yes [provider]  citalopram (CELEXA) 40 MG tablet Take 1 tablet (40 mg total) by mouth daily. 10/17/21  Yes Jon Billings, NP  famotidine (PEPCID) 20 MG tablet Take 1 tablet (20 mg total) by mouth 2 (two) times daily. 10/17/21  Yes Jon Billings, NP  furosemide (LASIX) 20 MG tablet Hold until followup with your outpatient doctor because your blood pressure was normal without it and you had acute kidney injury. 10/18/21  Yes Jon Billings, NP  gabapentin (NEURONTIN) 300 MG capsule Take 2 capsules (600 mg total) by mouth at bedtime as needed. 10/18/21  Yes Jon Billings, NP   losartan (COZAAR) 50 MG tablet Take 1 tablet (50 mg total) by mouth daily. 10/17/21  Yes Jon Billings, NP  metoprolol succinate (TOPROL-XL) 50 MG 24 hr tablet Take 1 tablet (50 mg total) by mouth daily. 10/17/21  Yes Jon Billings, NP  pravastatin (PRAVACHOL) 80 MG tablet Take 1 tablet (80 mg total) by mouth daily. 10/17/21  Yes Jon Billings, NP  Semaglutide,0.25 or 0.5MG/DOS, 2 MG/1.5ML SOPN INJECT 0.5MG INTO THE SKIN ONCE A WEEK AFTER STARTER DOSE OF 0.25MG ONCE WEEKLY FOR 4 WEEKS 10/18/21 10/18/22 Yes Jon Billings, NP  traZODone (DESYREL) 50 MG tablet TAKE 1 TABLET BY MOUTH ONCE DAILY AT BEDTIME AS NEEDED 10/17/21 10/17/22 Yes Jon Billings, NP  clopidogrel (PLAVIX) 75 MG tablet Take 1 tablet (75 mg total) by mouth at bedtime. 10/18/21   Jon Billings, NP  fluticasone (FLONASE) 50 MCG/ACT nasal spray Place 2 sprays into both nostrils daily. 12/27/20   [place], Henrine Screws T, NP  fluticasone (FLONASE) 50 MCG/ACT nasal spray INSTILL 2 SPRAYS INTO EACH NOSTRIL EVERY DAY 01/05/21 01/05/22  Marnee Guarneri T, NP  isosorbide mononitrate (IMDUR) 30 MG 24 hr tablet Hold until followup with your outpatient doctor because your blood pressure was low normal. 10/17/21   Jon Billings, NP  Na Sulfate-K Sulfate-Mg Sulf (SUPREP BOWEL PREP KIT) 17.5-3.13-1.6 GM/177ML SOLN Take 1 kit by mouth as directed. Patient not taking: Reported on  11/16/2021 11/09/21   Lin Landsman, MD    Allergies as of 11/05/2021   (No Known Allergies)    Family History  Problem Relation Age of Onset   CAD Mother    Diabetes Mother    Colon cancer Father    Breast cancer Paternal Grandmother    Stomach cancer Paternal Grandfather     Social History   Socioeconomic History   Marital status: Married    Spouse name: Not on file   Number of children: Not on file   Years of education: Not on file   Highest education level: Not on file  Occupational History   Not on file  Tobacco Use   Smoking status:  Former    Types: Cigarettes    Quit date: 1990    Years since quitting: 32.9   Smokeless tobacco: Never  Vaping Use   Vaping Use: Never used  Substance and Sexual Activity   Alcohol use: Not Currently   Drug use: Never   Sexual activity: Not on file  Other Topics Concern   Not on file  Social History Narrative   Not on file   Social Determinants of Health   Financial Resource Strain: Not on file  Food Insecurity: Not on file  Transportation Needs: Not on file  Physical Activity: Not on file  Stress: Not on file  Social Connections: Not on file  Intimate Partner Violence: Not on file    Review of Systems: See HPI, otherwise negative ROS  Physical Exam: BP 100/83    Pulse 75    Temp (!) 97 F (36.1 C) (Temporal)    Resp 18    Ht _0  (1.778 m)    Wt 128.9 kg    SpO2 95%    BMI 40.77 kg/m  General:   Alert,  pleasant and cooperative in NAD Head:  Normocephalic and atraumatic. Neck:  Supple; no masses or thyromegaly. Lungs:  Clear throughout to auscultation.    Heart:  Regular rate and rhythm. Abdomen:  Soft, nontender and nondistended. Normal bowel sounds, without guarding, and without rebound.   Neurologic:  Alert and  oriented x4;  grossly normal neurologically.  Impression/Plan: Steven Vang is here for an colonoscopy to be performed for colon cancer screening  Risks, benefits, limitations, and alternatives regarding  colonoscopy have been reviewed with the patient.  Questions have been answered.  All parties agreeable.   Sherri Sear, MD  11/28/2021, 11:02 AM

## 2021-11-28 NOTE — Transfer of Care (Signed)
Immediate Anesthesia Transfer of Care Note  Patient: Steven Vang  Procedure(s) Performed: COLONOSCOPY  Patient Location: PACU  Anesthesia Type:General  Level of Consciousness: sedated  Airway & Oxygen Therapy: Patient Spontanous Breathing  Post-op Assessment: Report given to RN and Post -op Vital signs reviewed and stable  Post vital signs: Reviewed and stable  Last Vitals:  Vitals Value Taken Time  BP    Temp    Pulse    Resp    SpO2      Last Pain:  Vitals:   11/28/21 1036  TempSrc: Temporal  PainSc: 0-No pain         Complications: No notable events documented.

## 2021-11-29 ENCOUNTER — Encounter: Payer: Self-pay | Admitting: Gastroenterology

## 2021-11-29 LAB — SURGICAL PATHOLOGY

## 2021-11-30 ENCOUNTER — Encounter: Payer: Self-pay | Admitting: Gastroenterology

## 2021-12-05 ENCOUNTER — Other Ambulatory Visit: Payer: Self-pay

## 2022-01-16 ENCOUNTER — Other Ambulatory Visit: Payer: Self-pay

## 2022-01-17 ENCOUNTER — Other Ambulatory Visit: Payer: Self-pay

## 2022-01-31 ENCOUNTER — Other Ambulatory Visit: Payer: Self-pay

## 2022-02-19 ENCOUNTER — Other Ambulatory Visit: Payer: Self-pay

## 2022-04-05 ENCOUNTER — Other Ambulatory Visit: Payer: Self-pay

## 2022-04-09 ENCOUNTER — Other Ambulatory Visit: Payer: Self-pay

## 2022-04-15 ENCOUNTER — Other Ambulatory Visit: Payer: Self-pay

## 2022-04-15 ENCOUNTER — Other Ambulatory Visit: Payer: Self-pay | Admitting: Nurse Practitioner

## 2022-04-15 DIAGNOSIS — I25110 Atherosclerotic heart disease of native coronary artery with unstable angina pectoris: Secondary | ICD-10-CM

## 2022-04-16 ENCOUNTER — Other Ambulatory Visit: Payer: Self-pay

## 2022-04-16 MED ORDER — CLOPIDOGREL BISULFATE 75 MG PO TABS
75.0000 mg | ORAL_TABLET | Freq: Every day | ORAL | 0 refills | Status: DC
  Filled 2022-04-16: qty 90, 90d supply, fill #0

## 2022-04-16 NOTE — Telephone Encounter (Signed)
Requested Prescriptions  ?Pending Prescriptions Disp Refills  ?? clopidogrel (PLAVIX) 75 MG tablet 90 tablet 0  ?  Sig: Take 1 tablet (75 mg total) by mouth at bedtime.  ?  ? Hematology: Antiplatelets - clopidogrel Passed - 04/15/2022  7:50 AM  ?  ?  Passed - HCT in normal range and within 180 days  ?  Hematocrit  ?Date Value Ref Range Status  ?10/17/2021 44.1 37.5 - 51.0 % Final  ?   ?  ?  Passed - HGB in normal range and within 180 days  ?  Hemoglobin  ?Date Value Ref Range Status  ?10/17/2021 14.8 13.0 - 17.7 g/dL Final  ?   ?  ?  Passed - PLT in normal range and within 180 days  ?  Platelets  ?Date Value Ref Range Status  ?10/17/2021 163 150 - 450 x10E3/uL Final  ?   ?  ?  Passed - Cr in normal range and within 360 days  ?  Creatinine  ?Date Value Ref Range Status  ?07/15/2014 1.07 0.60 - 1.30 mg/dL Final  ? ?Creatinine, Ser  ?Date Value Ref Range Status  ?10/17/2021 1.21 0.76 - 1.27 mg/dL Final  ?   ?  ?  Passed - Valid encounter within last 6 months  ?  Recent Outpatient Visits   ?      ? 5 months ago Essential hypertension  ? [place], NP  ? 6 months ago Annual physical exam  ? [facility] Jon Billings, NP  ? 1 year ago Type 2 diabetes mellitus without complication, without long-term current use of insulin ([place])  ? [place], NP  ? 1 year ago Type 2 diabetes mellitus with diabetic neuropathy, without long-term current use of insulin ([place])  ? [place], NP  ? 1 year ago Pneumonia due to COVID-19 virus  ? [facility] [place], Henrine Screws T, NP  ?  ?  ?Future Appointments   ?        ? In 3 days Jon Billings, NP [facility]  ?  ? ?  ?  ?  ? ?

## 2022-04-18 NOTE — Progress Notes (Signed)
? ?BP 125/75   Pulse 68   Temp 98.1 ?F (36.7 ?C) (Oral)   Wt 275 lb 12.8 oz (125.1 kg)   SpO2 96%   BMI 39.57 kg/m?   ? ?Subjective:  ? ? Patient ID: Steven Vang, male    DOB: Sep 17, 1956, 66 y.o.   MRN: [id number] ? ?HPI: ?Steven Vang is a 66 y.o. male ? ?Chief Complaint  ?Patient presents with  ? Hypertension  ?  Fluid retention and SOB- would like to discuss lasix rx as well   ? Hyperlipidemia  ? Diabetes  ?  Pt concerned about ozempix dose ; also states ozempic has not been covered x 1 month.  ? ?Diarrhea x 6  months  ? ?Most recent eye exam requested from [facility]    ? ?HYPERTENSION / HYPERLIPIDEMIA ?Satisfied with current treatment? yes ?Duration of hypertension: years ?BP monitoring frequency: not checking ?BP range:  ?BP medication side effects: no ?Past BP meds:  Losartan, Metoprolol ?Duration of hyperlipidemia: years ?Cholesterol medication side effects: no ?Cholesterol supplements: none ?Past cholesterol medications:  Not taking Pravastatin. Makes him cramp ?Medication compliance: good compliance ?Aspirin: yes ?Recent stressors: no ?Recurrent headaches: no ?Visual changes: no ?Palpitations: no ?Dyspnea: yes ?Chest pain: no ?Lower extremity edema: yes ?Dizzy/lightheaded: no ? ?DIABETES ?Hypoglycemic episodes: yes ?Polydipsia/polyuria: yes ?Visual disturbance: no ?Chest pain: no ?Paresthesias: no ?Glucose Monitoring: yes ? Accucheck frequency: Daily ? Fasting glucose: 290-300 ? Post prandial: ? Evening: ? Before meals: ?Taking Insulin?: no ? Long acting insulin: ? Short acting insulin: ?Blood Pressure Monitoring: not checking ?Retinal Examination: Not up to Date ?Foot Exam: Up to Date ?Diabetic Education: Not Completed ?Pneumovax: Up to Date ?Influenza: Up to Date ?Aspirin: yes ?Patient has been out of Ozempic for 3 weeks.  ? ?DEPRESSION/ANXIETY ?Patient states his mood has been terrible. Patient's wife said he is very quick tempered.  States she has noticed his depression more.  Lack of  motivation.   ? ?[place] Office Visit from 04/19/2022 in [place]  ?PHQ-9 Total Score 14  ? ?  ? ? ?  04/19/2022  ?  8:16 AM 11/16/2021  ? 11:34 AM 10/17/2021  ? 10:43 AM 06/23/2020  ?  2:23 PM  ?GAD 7 : Generalized Anxiety Score  ?Nervous, Anxious, on Edge '2 2 1 1  '$ ?Control/stop worrying 2 0 0 1  ?Worry too much - different things 2 2 0 1  ?Trouble relaxing 2 0 2 1  ?Restless 1 0 0 0  ?Easily annoyed or irritable 3 0 0 1  ?Afraid - awful might happen 1 0 0 0  ?Total GAD 7 Score '13 4 3 5  '$ ?Anxiety Difficulty Somewhat difficult Somewhat difficult  Somewhat difficult  ? ? ?ABDOMINAL ISSUES ?Duration:  6 months ?Patient has been having diarrhea for the last 6 months. States it doesn't matter what he eats.  It can happen at any time.   ? ? ?Relevant past medical, surgical, family and social history reviewed and updated as indicated. Interim medical history since our last visit reviewed. ?Allergies and medications reviewed and updated. ? ?Review of Systems  ?Eyes:  Negative for visual disturbance.  ?Respiratory:  Negative for chest tightness and shortness of breath.   ?Cardiovascular:  Negative for chest pain, palpitations and leg swelling.  ?Gastrointestinal:  Positive for abdominal pain and diarrhea.  ?Endocrine: Negative for polydipsia and polyuria.  ?Neurological:  Negative for dizziness, light-headedness, numbness and headaches.  ?Psychiatric/Behavioral:  Positive for dysphoric mood. Negative  for suicidal ideas. The patient is nervous/anxious.   ? ?Per HPI unless specifically indicated above ? ?   ?Objective:  ?  ?BP 125/75   Pulse 68   Temp 98.1 ?F (36.7 ?C) (Oral)   Wt 275 lb 12.8 oz (125.1 kg)   SpO2 96%   BMI 39.57 kg/m?   ?Wt Readings from Last 3 Encounters:  ?04/19/22 275 lb 12.8 oz (125.1 kg)  ?11/28/21 284 lb 2.8 oz (128.9 kg)  ?11/16/21 284 lb 2 oz (128.9 kg)  ?  ?Physical Exam ?Vitals and nursing note reviewed.  ?Constitutional:   ?   General: He is not in acute distress. ?   Appearance:  Normal appearance. He is obese. He is not ill-appearing, toxic-appearing or diaphoretic.  ?HENT:  ?   Head: Normocephalic.  ?   Right Ear: External ear normal.  ?   Left Ear: External ear normal.  ?   Nose: Nose normal. No congestion or rhinorrhea.  ?   Mouth/Throat:  ?   Mouth: Mucous membranes are moist.  ?Eyes:  ?   General:     ?   Right eye: No discharge.     ?   Left eye: No discharge.  ?   Extraocular Movements: Extraocular movements intact.  ?   Conjunctiva/sclera: Conjunctivae normal.  ?   Pupils: Pupils are equal, round, and reactive to light.  ?Cardiovascular:  ?   Rate and Rhythm: Normal rate and regular rhythm.  ?   Heart sounds: No murmur heard. ?Pulmonary:  ?   Effort: Pulmonary effort is normal. No respiratory distress.  ?   Breath sounds: Normal breath sounds. No wheezing, rhonchi or rales.  ?Abdominal:  ?   General: Abdomen is flat. Bowel sounds are normal.  ?Musculoskeletal:  ?   Cervical back: Normal range of motion and neck supple.  ?Skin: ?   General: Skin is warm and dry.  ?   Capillary Refill: Capillary refill takes less than 2 seconds.  ?Neurological:  ?   General: No focal deficit present.  ?   Mental Status: He is alert and oriented to person, place, and time.  ?Psychiatric:     ?   Mood and Affect: Mood normal.     ?   Behavior: Behavior normal.     ?   Thought Content: Thought content normal.     ?   Judgment: Judgment normal.  ? ? ?Results for orders placed or performed during the hospital encounter of 11/28/21  ?Surgical pathology  ?Result Value Ref Range  ? SURGICAL PATHOLOGY    ?  SURGICAL PATHOLOGY ?CASE: [id number] ?PATIENT: Steven Vang ?Surgical Pathology Report ? ? ? ? ?Specimen Submitted: ?A. Colon polyp, transverse; cold snare ? ?Clinical History: History of colon polyps and family history of colon ?cancer.  Colon polyp, diverticulosis ? ? ? ? ? ?DIAGNOSIS: ?A. COLON POLYP, TRANSVERSE; COLD SNARE: ?- TUBULAR ADENOMA. ?- NEGATIVE FOR HIGH GRADE DYSPLASIA AND  MALIGNANCY. ? ? ?GROSS DESCRIPTION: ?A. Labeled: Transverse colon polyp cold snare ?Received: Formalin ?Collection time: 11:24 AM on 11/28/2021 ?Placed into formalin time: 11:24 AM 11/28/2021 ?Tissue fragment(s): Multiple ?Size: Aggregate, 1.2 x 0.5 x 0.2 cm ?Description: Received is a single elongated fragment of tan-pink soft ?tissue admixed with intestinal debris.  The ratio of soft tissue to ?intestinal debris is 40: 60. ?Entirely submitted in 1 cassette. ? ?RB 11/28/2021 ? ?Final Diagnosis performed by Betsy Pries, MD.   Electronically signed ?11/29/2021 8:35:28AM ?The electronic signature  indicates  that the named Attending Pathologist ?has evaluated the specimen ?Technical component performed at [facility], [address], [place], ?[place] [address] Lab: [phone] Dir: Rush Farmer, MD, MMM ? Professional component performed at [facility], [facility], [place], [place], [place] [address] Lab: [phone] ?Dir: Kathi Simpers, MD ?  ? ?   ?Assessment & Plan:  ? ?Problem List Items Addressed This Visit   ? ?  ? Cardiovascular and Mediastinum  ? Coronary artery disease  ?  Chronic. Patient has not been on his Plavix.  Encouraged him to continue.  He has been taking ASA daily.  Will refill medication during visit today.  Discussed with patient that he should try taking Pravastatin once weekly to see if is able to tolerate it.  Was having uncontrolled myalgias with taking Pravastatin daily.  Will work to increase dose as he is able to tolerate it.  Will refill his statin.  Labs ordered today. Follow up in 1 month for reevaluation. ?  ?  ? Relevant Medications  ? pravastatin (PRAVACHOL) 80 MG tablet  ? furosemide (LASIX) 20 MG tablet  ? clopidogrel (PLAVIX) 75 MG tablet  ? losartan (COZAAR) 50 MG tablet  ? metoprolol succinate (TOPROL-XL) 50 MG 24 hr tablet  ? Essential hypertension  ?  Chronic.  Improved.  Patient has restarted his Lasix, Losartan and Metoprolol.  Patient encouraged to  continue with medication regimen. Recommend checking blood pressures at home daily. Keep a log and bring to next visit. Follow up in 6 months for reevaluation.  Call sooner if concerns arise.  ? ?  ?  ? Relevant Medications  ? p

## 2022-04-19 ENCOUNTER — Encounter: Payer: Self-pay | Admitting: Nurse Practitioner

## 2022-04-19 ENCOUNTER — Ambulatory Visit: Payer: 59 | Admitting: Nurse Practitioner

## 2022-04-19 ENCOUNTER — Other Ambulatory Visit: Payer: Self-pay

## 2022-04-19 ENCOUNTER — Telehealth: Payer: Self-pay

## 2022-04-19 VITALS — BP 125/75 | HR 68 | Temp 98.1°F | Wt 275.8 lb

## 2022-04-19 DIAGNOSIS — F330 Major depressive disorder, recurrent, mild: Secondary | ICD-10-CM | POA: Diagnosis not present

## 2022-04-19 DIAGNOSIS — R600 Localized edema: Secondary | ICD-10-CM

## 2022-04-19 DIAGNOSIS — F411 Generalized anxiety disorder: Secondary | ICD-10-CM | POA: Diagnosis not present

## 2022-04-19 DIAGNOSIS — I700 Atherosclerosis of aorta: Secondary | ICD-10-CM | POA: Diagnosis not present

## 2022-04-19 DIAGNOSIS — E785 Hyperlipidemia, unspecified: Secondary | ICD-10-CM | POA: Diagnosis not present

## 2022-04-19 DIAGNOSIS — I10 Essential (primary) hypertension: Secondary | ICD-10-CM

## 2022-04-19 DIAGNOSIS — K219 Gastro-esophageal reflux disease without esophagitis: Secondary | ICD-10-CM

## 2022-04-19 DIAGNOSIS — E1169 Type 2 diabetes mellitus with other specified complication: Secondary | ICD-10-CM

## 2022-04-19 DIAGNOSIS — I509 Heart failure, unspecified: Secondary | ICD-10-CM

## 2022-04-19 DIAGNOSIS — E1140 Type 2 diabetes mellitus with diabetic neuropathy, unspecified: Secondary | ICD-10-CM | POA: Diagnosis not present

## 2022-04-19 DIAGNOSIS — I25110 Atherosclerotic heart disease of native coronary artery with unstable angina pectoris: Secondary | ICD-10-CM | POA: Diagnosis not present

## 2022-04-19 MED ORDER — TRAZODONE HCL 50 MG PO TABS
ORAL_TABLET | Freq: Every evening | ORAL | 1 refills | Status: DC | PRN
  Filled 2022-04-19 – 2022-11-27 (×2): qty 90, 90d supply, fill #0

## 2022-04-19 MED ORDER — LOSARTAN POTASSIUM 50 MG PO TABS
50.0000 mg | ORAL_TABLET | Freq: Every day | ORAL | 1 refills | Status: DC
  Filled 2022-04-19 – 2022-07-29 (×2): qty 90, 90d supply, fill #0
  Filled 2022-11-27: qty 90, 90d supply, fill #1

## 2022-04-19 MED ORDER — CLOPIDOGREL BISULFATE 75 MG PO TABS
75.0000 mg | ORAL_TABLET | Freq: Every day | ORAL | 1 refills | Status: DC
  Filled 2022-04-19 (×2): qty 90, 90d supply, fill #0
  Filled 2022-07-29: qty 90, 90d supply, fill #1

## 2022-04-19 MED ORDER — SEMAGLUTIDE (1 MG/DOSE) 4 MG/3ML ~~LOC~~ SOPN
1.0000 mg | PEN_INJECTOR | SUBCUTANEOUS | 1 refills | Status: DC
  Filled 2022-04-19: qty 9, 84d supply, fill #0
  Filled 2022-04-19: qty 3, 28d supply, fill #0
  Filled 2022-04-22: qty 9, 84d supply, fill #0

## 2022-04-19 MED ORDER — PRAVASTATIN SODIUM 80 MG PO TABS
80.0000 mg | ORAL_TABLET | ORAL | 0 refills | Status: DC
  Filled 2022-04-19: qty 12, 84d supply, fill #0

## 2022-04-19 MED ORDER — BUPROPION HCL ER (SR) 100 MG PO TB12
100.0000 mg | ORAL_TABLET | Freq: Every day | ORAL | 0 refills | Status: DC
  Filled 2022-04-19: qty 30, 30d supply, fill #0

## 2022-04-19 MED ORDER — CITALOPRAM HYDROBROMIDE 40 MG PO TABS
40.0000 mg | ORAL_TABLET | Freq: Every day | ORAL | 1 refills | Status: DC
  Filled 2022-04-19 – 2022-07-29 (×2): qty 90, 90d supply, fill #0
  Filled 2022-11-18: qty 90, 90d supply, fill #1

## 2022-04-19 MED ORDER — METOPROLOL SUCCINATE ER 50 MG PO TB24
50.0000 mg | ORAL_TABLET | Freq: Every day | ORAL | 1 refills | Status: DC
  Filled 2022-04-19 – 2022-07-29 (×2): qty 90, 90d supply, fill #0
  Filled 2022-11-27: qty 90, 90d supply, fill #1

## 2022-04-19 MED ORDER — GABAPENTIN 300 MG PO CAPS
600.0000 mg | ORAL_CAPSULE | Freq: Every evening | ORAL | 1 refills | Status: DC | PRN
  Filled 2022-04-19 – 2022-11-27 (×2): qty 180, 90d supply, fill #0

## 2022-04-19 MED ORDER — FAMOTIDINE 20 MG PO TABS
20.0000 mg | ORAL_TABLET | Freq: Two times a day (BID) | ORAL | 1 refills | Status: DC
  Filled 2022-04-19: qty 180, 90d supply, fill #0

## 2022-04-19 MED ORDER — FUROSEMIDE 20 MG PO TABS
ORAL_TABLET | ORAL | 1 refills | Status: DC
  Filled 2022-04-19: qty 90, fill #0
  Filled 2022-07-29 – 2022-08-08 (×2): qty 90, 90d supply, fill #0
  Filled 2022-11-27: qty 90, 90d supply, fill #1

## 2022-04-19 NOTE — Assessment & Plan Note (Signed)
Chronic. Not well controlled with Celexa '40mg'$ .  Will add Wellbutrin '100mg'$ .  Side effects and benefits discussed during visit.  Follow up in 1 month for reevaluation.  ?

## 2022-04-19 NOTE — Telephone Encounter (Signed)
Spoke with patient, advised patient that we have already submitted a PA through covermymeds and we are awaiting determination. Also advised that we will call patient as soon as we receive an answer. Pt voiced understanding.  ?

## 2022-04-19 NOTE — Assessment & Plan Note (Addendum)
Chronic. Not well controlled. Lengthy discussion had patient regarding diet and changes that need to be made.  Will increase Ozempic to '1mg'$ .  Discussed with patient the diarrhea is likely due to uncontrolled diabetes and diet choices.  Encouraged patient to change diet to see if symptoms improve.  If not improved will reevaluate.  Continue to check blood sugars over the next month.  May need to increase Ozempic to '2mg'$ .  Follow up in 1 month for reevaluation. ?

## 2022-04-19 NOTE — Assessment & Plan Note (Signed)
Chronic.  Controlled.  Continue with current medication regimen of Famotidine.  Labs ordered today.  Return to clinic in 6 months for reevaluation.  Call sooner if concerns arise.  ? ?

## 2022-04-19 NOTE — Assessment & Plan Note (Signed)
Chronic. Discussed with patient that he should try taking Pravastatin once weekly to see if is able to tolerate it.  Was having uncontrolled myalgias with taking Pravastatin daily.  Will work to increase dose as he is able to tolerate it.  Will refill his statin.  Labs ordered today. Follow up in 1 month for reevaluation. ?

## 2022-04-19 NOTE — Assessment & Plan Note (Signed)
Chronic. Has not seen Cardiology. Not taking Imdur.  Patient encouraged to make follow up with Cardiology.  ?

## 2022-04-19 NOTE — Telephone Encounter (Signed)
PA initiated for Ozempic, awaiting determination.  ? ?Key: [id number] ?

## 2022-04-19 NOTE — Assessment & Plan Note (Signed)
Chronic.  Improved.  Patient has restarted his Lasix, Losartan and Metoprolol.  Patient encouraged to continue with medication regimen. Recommend checking blood pressures at home daily. Keep a log and bring to next visit. Follow up in 6 months for reevaluation.  Call sooner if concerns arise.  ?

## 2022-04-19 NOTE — Telephone Encounter (Signed)
Pt wife stated that she was advised by the pharmacy that the office should call the insurance company to get the PA done faster instead of them having to wait several days / please advise asap  ?

## 2022-04-19 NOTE — Assessment & Plan Note (Signed)
Chronic. Patient has not been on his Plavix.  Encouraged him to continue.  He has been taking ASA daily.  Will refill medication during visit today.  Discussed with patient that he should try taking Pravastatin once weekly to see if is able to tolerate it.  Was having uncontrolled myalgias with taking Pravastatin daily.  Will work to increase dose as he is able to tolerate it.  Will refill his statin.  Labs ordered today. Follow up in 1 month for reevaluation. ?

## 2022-04-20 LAB — LIPID PANEL
Chol/HDL Ratio: 8.6 ratio — ABNORMAL HIGH (ref 0.0–5.0)
Cholesterol, Total: 250 mg/dL — ABNORMAL HIGH (ref 100–199)
HDL: 29 mg/dL — ABNORMAL LOW (ref >39)
LDL Chol Calc (NIH): 152 mg/dL — ABNORMAL HIGH (ref 0–99)
Triglycerides: 369 mg/dL — ABNORMAL HIGH (ref 0–149)
VLDL Cholesterol Cal: 69 mg/dL — ABNORMAL HIGH (ref 5–40)

## 2022-04-20 LAB — COMPREHENSIVE METABOLIC PANEL
ALT: 16 IU/L (ref 0–44)
AST: 17 IU/L (ref 0–40)
Albumin/Globulin Ratio: 1.7 (ref 1.2–2.2)
Albumin: 4.3 g/dL (ref 3.8–4.8)
Alkaline Phosphatase: 89 IU/L (ref 44–121)
BUN/Creatinine Ratio: 11 (ref 10–24)
BUN: 13 mg/dL (ref 8–27)
Bilirubin Total: 0.3 mg/dL (ref 0.0–1.2)
CO2: 22 mmol/L (ref 20–29)
Calcium: 9.2 mg/dL (ref 8.6–10.2)
Chloride: 102 mmol/L (ref 96–106)
Creatinine, Ser: 1.16 mg/dL (ref 0.76–1.27)
Globulin, Total: 2.5 g/dL (ref 1.5–4.5)
Glucose: 221 mg/dL — ABNORMAL HIGH (ref 70–99)
Potassium: 4.7 mmol/L (ref 3.5–5.2)
Sodium: 138 mmol/L (ref 134–144)
Total Protein: 6.8 g/dL (ref 6.0–8.5)
eGFR: 70 mL/min/{1.73_m2} (ref >59)

## 2022-04-20 LAB — HEMOGLOBIN A1C
Est. average glucose Bld gHb Est-mCnc: 160 mg/dL
Hgb A1c MFr Bld: 7.2 % — ABNORMAL HIGH (ref 4.8–5.6)

## 2022-04-22 ENCOUNTER — Telehealth: Payer: Self-pay

## 2022-04-22 ENCOUNTER — Other Ambulatory Visit: Payer: Self-pay

## 2022-04-22 NOTE — Telephone Encounter (Signed)
LVM regarding PA approval for Ozempic.  ?

## 2022-04-22 NOTE — Progress Notes (Signed)
Hi Steven Vang. Hope you had a good week.  Your lab work shows that your cholesterol is significantly elevated from prior. I recommend restarting the pravastatin once weekly as discussed during our visit.   ? ?Your A1c shows that you are still controlled at 7.2.  However your sugar was 221 so I think we will trend up if the diet changes we discussed during the visit are not made.  Other lab work looks good.  I will see you at our next visit.

## 2022-04-23 ENCOUNTER — Other Ambulatory Visit: Payer: Self-pay

## 2022-04-24 ENCOUNTER — Other Ambulatory Visit: Payer: Self-pay

## 2022-05-24 ENCOUNTER — Ambulatory Visit: Payer: 59 | Admitting: Nurse Practitioner

## 2022-05-30 ENCOUNTER — Telehealth: Payer: Self-pay

## 2022-05-30 NOTE — Telephone Encounter (Signed)
Called patient to reschedule appointment for 6/16 due to provider being out sick. Patient stated that he is completely out of Wellbutrin and needs a refill called in immediately. Please send a refill to the pharmacy for patient and call to let him know once completed.

## 2022-05-31 ENCOUNTER — Other Ambulatory Visit: Payer: Self-pay

## 2022-05-31 ENCOUNTER — Encounter: Payer: Self-pay | Admitting: Family Medicine

## 2022-05-31 ENCOUNTER — Telehealth (INDEPENDENT_AMBULATORY_CARE_PROVIDER_SITE_OTHER): Payer: 59 | Admitting: Family Medicine

## 2022-05-31 ENCOUNTER — Ambulatory Visit: Payer: 59 | Admitting: Nurse Practitioner

## 2022-05-31 VITALS — BP 129/83 | Wt 262.0 lb

## 2022-05-31 DIAGNOSIS — E785 Hyperlipidemia, unspecified: Secondary | ICD-10-CM

## 2022-05-31 DIAGNOSIS — E1169 Type 2 diabetes mellitus with other specified complication: Secondary | ICD-10-CM | POA: Diagnosis not present

## 2022-05-31 DIAGNOSIS — F330 Major depressive disorder, recurrent, mild: Secondary | ICD-10-CM

## 2022-05-31 DIAGNOSIS — I25110 Atherosclerotic heart disease of native coronary artery with unstable angina pectoris: Secondary | ICD-10-CM | POA: Diagnosis not present

## 2022-05-31 MED ORDER — BUPROPION HCL ER (XL) 150 MG PO TB24
150.0000 mg | ORAL_TABLET | Freq: Every day | ORAL | 1 refills | Status: DC
  Filled 2022-05-31: qty 30, 30d supply, fill #0
  Filled 2022-06-28: qty 30, 30d supply, fill #1

## 2022-05-31 MED ORDER — PRAVASTATIN SODIUM 80 MG PO TABS
80.0000 mg | ORAL_TABLET | ORAL | 0 refills | Status: DC
  Filled 2022-05-31: qty 13, 90d supply, fill #0
  Filled 2022-07-29: qty 12, 84d supply, fill #0
  Filled 2022-11-27: qty 12, 84d supply, fill #1

## 2022-05-31 NOTE — Telephone Encounter (Signed)
I can see him virtually now since I had a cancellation if he can do that

## 2022-05-31 NOTE — Assessment & Plan Note (Signed)
Improved, but not quite there. Will change '100mg'$  wellbutrin to '150mg'$  XR and recheck in 7 weeks at DM follow up. Call with any concerns or I not getting better.

## 2022-05-31 NOTE — Telephone Encounter (Signed)
Patient added to the schedule

## 2022-05-31 NOTE — Assessment & Plan Note (Signed)
Tolerating the pravastatin weekly well. Refill given today for a year. Recheck labs when he comes back in August for his diabetes follow up. Continue to monitor.

## 2022-05-31 NOTE — Progress Notes (Signed)
BP 129/83   Wt 262 lb (118.8 kg)   BMI 37.59 kg/m    Subjective:    Patient ID: Steven Vang, male    DOB: 12-Feb-1956, 66 y.o.   MRN: [id number]  HPI: Steven Vang is a 66 y.o. male  Chief Complaint  Patient presents with   Depression   Hyperlipidemia   HYPERLIPIDEMIA- tolerating the pravastatin 1x a week Hyperlipidemia status: good compliance Satisfied with current treatment?  yes Side effects:  not on this dose Medication compliance: good compliance Aspirin:  no The 10-year ASCVD risk score (Arnett DK, et al., 2019) is: 38.4%   Values used to calculate the score:     Age: 31 years     Sex: Male     Is Non-Hispanic African American: No     Diabetic: Yes     Tobacco smoker: No     Systolic Blood Pressure: 253 mmHg     Is BP treated: Yes     HDL Cholesterol: 29 mg/dL     Total Cholesterol: 250 mg/dL Chest pain:  no Coronary artery disease:  yes  DEPRESSION Mood status: better Satisfied with current treatment?: no Symptom severity: mild  Duration of current treatment :  about a month Side effects: no Medication compliance: good compliance Psychotherapy/counseling: no  Previous psychiatric medications: celexa, wellbutrin Depressed mood: yes Anxious mood: yes Anhedonia: no Significant weight loss or gain: yes Insomnia: no  Fatigue: yes Feelings of worthlessness or guilt: no Impaired concentration/indecisiveness: no Suicidal ideations: no Hopelessness: no Crying spells: no    05/31/2022   11:42 AM 04/19/2022    8:15 AM 11/16/2021   11:31 AM 10/17/2021   10:43 AM 11/22/2020    9:06 AM  Depression screen PHQ 2/9  Decreased Interest 1 2 0 1 0  Down, Depressed, Hopeless 0 2 0 0 0  PHQ - 2 Score 1 4 0 1 0  Altered sleeping 0 2 0 1 3  Tired, decreased energy '1 3 1 1 3  '$ Change in appetite 1 2 0 2 0  Feeling bad or failure about yourself  0 1 0 0 0  Trouble concentrating 0 2 0 0 3  Moving slowly or fidgety/restless 2 0 1 1 0  Suicidal thoughts 0 0 0 0 0   PHQ-9 Score '5 14 2 6 9  '$ Difficult doing work/chores Not difficult at all Not difficult at all   Somewhat difficult    Relevant past medical, surgical, family and social history reviewed and updated as indicated. Interim medical history since our last visit reviewed. Allergies and medications reviewed and updated.  Review of Systems  Constitutional: Negative.   Respiratory: Negative.    Cardiovascular: Negative.   Gastrointestinal: Negative.   Musculoskeletal: Negative.   Neurological: Negative.   Psychiatric/Behavioral:  Positive for dysphoric mood. Negative for agitation, behavioral problems, confusion, decreased concentration, hallucinations, self-injury, sleep disturbance and suicidal ideas. The patient is not nervous/anxious and is not hyperactive.     Per HPI unless specifically indicated above     Objective:    BP 129/83   Wt 262 lb (118.8 kg)   BMI 37.59 kg/m   Wt Readings from Last 3 Encounters:  05/31/22 262 lb (118.8 kg)  04/19/22 275 lb 12.8 oz (125.1 kg)  11/28/21 284 lb 2.8 oz (128.9 kg)    Physical Exam Vitals and nursing note reviewed.  Constitutional:      General: He is not in acute distress.    Appearance: Normal appearance.  He is not ill-appearing, toxic-appearing or diaphoretic.  HENT:     Head: Normocephalic and atraumatic.     Right Ear: External ear normal.     Left Ear: External ear normal.     Nose: Nose normal.     Mouth/Throat:     Mouth: Mucous membranes are moist.     Pharynx: Oropharynx is clear.  Eyes:     General: No scleral icterus.       Right eye: No discharge.        Left eye: No discharge.     Conjunctiva/sclera: Conjunctivae normal.     Pupils: Pupils are equal, round, and reactive to light.  Pulmonary:     Effort: Pulmonary effort is normal. No respiratory distress.     Comments: Speaking in full sentences Musculoskeletal:        General: Normal range of motion.     Cervical back: Normal range of motion.  Skin:     Coloration: Skin is not jaundiced or pale.     Findings: No bruising, erythema, lesion or rash.  Neurological:     Mental Status: He is alert and oriented to person, place, and time. Mental status is at baseline.  Psychiatric:        Mood and Affect: Mood normal.        Behavior: Behavior normal.        Thought Content: Thought content normal.        Judgment: Judgment normal.     Results for orders placed or performed in visit on 04/23/22  HM DIABETES EYE EXAM  Result Value Ref Range   HM Diabetic Eye Exam No Retinopathy No Retinopathy      Assessment & Plan:   Problem List Items Addressed This Visit       Cardiovascular and Mediastinum   Coronary artery disease - Primary    Has not seen his cardiologist since 2021. Will get him back into see him. New referral placed today. BP good- hold on imdur until he sees cardiology to avoid hypotension.      Relevant Medications   pravastatin (PRAVACHOL) 80 MG tablet   Other Relevant Orders   Ambulatory referral to Cardiology     Endocrine   Hyperlipidemia associated with type 2 diabetes mellitus ([place])    Tolerating the pravastatin weekly well. Refill given today for a year. Recheck labs when he comes back in August for his diabetes follow up. Continue to monitor.       Relevant Medications   pravastatin (PRAVACHOL) 80 MG tablet     Other   Mild episode of recurrent major depressive disorder (HCC)    Improved, but not quite there. Will change '100mg'$  wellbutrin to '150mg'$  XR and recheck in 7 weeks at DM follow up. Call with any concerns or I not getting better.       Relevant Medications   buPROPion (WELLBUTRIN XL) 150 MG 24 hr tablet     Follow up plan: Return in about 7 weeks (around 07/19/2022) for with PCP .    This visit was completed via video visit through MyChart due to the restrictions of the COVID-19 pandemic. All issues as above were discussed and addressed. Physical exam was done as above through visual confirmation  on video through MyChart. If it was felt that the patient should be evaluated in the office, they were directed there. The patient verbally consented to this visit. Location of the patient: home Location of the provider: work Those involved  with this call:  Provider: Park Liter, DO CMA:  Enriqueta Shutter Front Desk/Registration: Alejandro Mulling  Time spent on call:  25 minutes with patient face to face via video conference. More than 50% of this time was spent in counseling and coordination of care. 40 minutes total spent in review of patient's record and preparation of their chart.

## 2022-05-31 NOTE — Assessment & Plan Note (Signed)
Has not seen his cardiologist since 2021. Will get him back into see him. New referral placed today. BP good- hold on imdur until he sees cardiology to avoid hypotension.

## 2022-06-13 ENCOUNTER — Ambulatory Visit: Payer: 59 | Admitting: Nurse Practitioner

## 2022-06-28 ENCOUNTER — Other Ambulatory Visit: Payer: Self-pay

## 2022-07-18 NOTE — Progress Notes (Signed)
BP 118/71   Pulse 67   Temp 98.3 F (36.8 C)   Wt 262 lb 11.2 oz (119.2 kg)   SpO2 97%   BMI 37.69 kg/m    Subjective:    Patient ID: Steven Vang, male    DOB: [birth year], 66 y.o.   MRN: [id number]  HPI: Steven Vang is a 66 y.o. male  Chief Complaint  Patient presents with  . Diabetes    Patient has not had eye exam this year   . Hypertension  . Hyperlipidemia  . Depression  . Foot Pain    Patient states his left foot has been hurting for a few weeks, especially when he puts pressure on it.   . Abdominal Pain    Patient states he has abdominal pain, that feels like a sharp pain or burning pain. Patient states since May he has been having loose stools after meals    HYPERLIPIDEMIA-  Does not have an appt with Cardiology. tolerating the pravastatin 1x a week Hyperlipidemia status: good compliance Satisfied with current treatment?  yes Side effects:  not on this dose Medication compliance: good compliance Aspirin:  no The 10-year ASCVD risk score (Arnett DK, et al., 2019) is: 33.8%   Values used to calculate the score:     Age: 28 years     Sex: Male     Is Non-Hispanic African American: No     Diabetic: Yes     Tobacco smoker: No     Systolic Blood Pressure: 798 mmHg     Is BP treated: Yes     HDL Cholesterol: 29 mg/dL     Total Cholesterol: 250 mg/dL Chest pain:  no Coronary artery disease:  yes  DEPRESSION Patient stats he feels like the increased dose of Wellbutrin is going alright.  He feels like he does have some days where he has no motivation.   Mood status: better Satisfied with current treatment?: no Symptom severity: mild  Duration of current treatment :  about a month Side effects: no Medication compliance: good compliance Psychotherapy/counseling: no  Previous psychiatric medications: celexa, wellbutrin Depressed mood: yes Anxious mood: yes Anhedonia: no Significant weight loss or gain: yes Insomnia: no  Fatigue: yes Feelings of  worthlessness or guilt: no Impaired concentration/indecisiveness: no Suicidal ideations: no Hopelessness: no Crying spells: no    05/31/2022   11:42 AM 04/19/2022    8:15 AM 11/16/2021   11:31 AM 10/17/2021   10:43 AM 11/22/2020    9:06 AM  Depression screen PHQ 2/9  Decreased Interest 1 2 0 1 0  Down, Depressed, Hopeless 0 2 0 0 0  PHQ - 2 Score 1 4 0 1 0  Altered sleeping 0 2 0 1 3  Tired, decreased energy 1 3 1 1 3   Change in appetite 1 2 0 2 0  Feeling bad or failure about yourself  0 1 0 0 0  Trouble concentrating 0 2 0 0 3  Moving slowly or fidgety/restless 2 0 1 1 0  Suicidal thoughts 0 0 0 0 0  PHQ-9 Score 5 14 2 6 9   Difficult doing work/chores Not difficult at all Not difficult at all   Somewhat difficult    Patient states he is having a lot of pain on his L heel.  Pain has been going on for a couple of weeks.  Not sure if he did something to it.  It is worse after he sits for awhile and then improves.  Relevant past medical, surgical, family and social history reviewed and updated as indicated. Interim medical history since our last visit reviewed. Allergies and medications reviewed and updated.  Review of Systems  Constitutional: Negative.   Eyes:  Negative for visual disturbance.  Respiratory: Negative.  Negative for chest tightness and shortness of breath.   Cardiovascular: Negative.  Negative for chest pain, palpitations and leg swelling.  Gastrointestinal: Negative.   Endocrine: Negative for polydipsia and polyuria.  Musculoskeletal:        Left foot pain  Neurological: Negative.  Negative for dizziness, light-headedness, numbness and headaches.  Psychiatric/Behavioral:  Positive for dysphoric mood. Negative for agitation, behavioral problems, confusion, decreased concentration, hallucinations, self-injury, sleep disturbance and suicidal ideas. The patient is not nervous/anxious and is not hyperactive.     Per HPI unless specifically indicated above      Objective:    BP 118/71   Pulse 67   Temp 98.3 F (36.8 C)   Wt 262 lb 11.2 oz (119.2 kg)   SpO2 97%   BMI 37.69 kg/m   Wt Readings from Last 3 Encounters:  07/19/22 262 lb 11.2 oz (119.2 kg)  05/31/22 262 lb (118.8 kg)  04/19/22 275 lb 12.8 oz (125.1 kg)    Physical Exam Vitals and nursing note reviewed.  Constitutional:      General: He is not in acute distress.    Appearance: Normal appearance. He is not ill-appearing, toxic-appearing or diaphoretic.  HENT:     Head: Normocephalic and atraumatic.     Right Ear: External ear normal.     Left Ear: External ear normal.     Nose: Nose normal. No congestion or rhinorrhea.     Mouth/Throat:     Mouth: Mucous membranes are moist.     Pharynx: Oropharynx is clear.  Eyes:     General: No scleral icterus.       Right eye: No discharge.        Left eye: No discharge.     Extraocular Movements: Extraocular movements intact.     Conjunctiva/sclera: Conjunctivae normal.     Pupils: Pupils are equal, round, and reactive to light.  Cardiovascular:     Rate and Rhythm: Normal rate and regular rhythm.     Heart sounds: No murmur heard. Pulmonary:     Effort: Pulmonary effort is normal. No respiratory distress.     Breath sounds: Normal breath sounds. No wheezing, rhonchi or rales.     Comments: Speaking in full sentences Abdominal:     General: Abdomen is flat. Bowel sounds are normal.  Musculoskeletal:        General: Normal range of motion.     Cervical back: Normal range of motion and neck supple.     Left foot: Tenderness present.     Comments: Heal and arch pain with palpation  Skin:    General: Skin is warm and dry.     Capillary Refill: Capillary refill takes less than 2 seconds.     Coloration: Skin is not jaundiced or pale.     Findings: No bruising, erythema, lesion or rash.  Neurological:     General: No focal deficit present.     Mental Status: He is alert and oriented to person, place, and time. Mental status  is at baseline.  Psychiatric:        Mood and Affect: Mood normal.        Behavior: Behavior normal.        Thought Content:  Thought content normal.        Judgment: Judgment normal.    Results for orders placed or performed in visit on 04/23/22  HM DIABETES EYE EXAM  Result Value Ref Range   HM Diabetic Eye Exam No Retinopathy No Retinopathy      Assessment & Plan:   Problem List Items Addressed This Visit       Cardiovascular and Mediastinum   Coronary artery disease    Has not seen his cardiologist since 2021. Discussed importance of making an appt with Cardiology for reevaluation.. BP good- hold on imdur until he sees cardiology to avoid hypotension.      Relevant Orders   Comp Met (CMET)   Essential hypertension    Has not seen his cardiologist since 2021. Discussed importance of making an appt with Cardiology for reevaluation.. BP good- hold on imdur until he sees cardiology to avoid hypotension.      Relevant Orders   Comp Met (CMET)   Left ventricular dysfunction    Has not seen his cardiologist since 2021. Discussed importance of making an appt with Cardiology for reevaluation.      Unstable angina (HCC) - Primary    Has not seen his cardiologist since 2021. Discussed importance of making an appt with Cardiology for reevaluation.      Aortic atherosclerosis (HCC)    Chronic.  Controlled.  Continue with current medication regimen.  He is back on the Plavix.  Agrees to take Atorvastatin 1.5 x weekly.  Labs ordered today.  Return to clinic in 3 months for reevaluation.  Call sooner if concerns arise.        CHF (congestive heart failure) ([place])    Has not seen his cardiologist since 2021. Discussed importance of making an appt with Cardiology for reevaluation.        Endocrine   Hyperlipidemia associated with type 2 diabetes mellitus ([place])    Tolerating the pravastatin weekly well. Agrees to increase to 1.5x weekly.  Labs ordered today. Continue to monitor.        Relevant Medications   Semaglutide, 1 MG/DOSE, 4 MG/3ML SOPN   Type 2 diabetes mellitus with diabetic neuropathy, unspecified (HCC)    Chronic.  Controlled at 7.2.  Sugars are running 100-130.  Lost about 15lbs since last visit.  Continue with current medication regimen.  Labs ordered today.  Return to clinic in 3 months for reevaluation.  Call sooner if concerns arise.        Relevant Medications   Semaglutide, 1 MG/DOSE, 4 MG/3ML SOPN   Other Relevant Orders   HgB A1c     Other   Mild episode of recurrent major depressive disorder (HCC)    Chronic. Doing okay.  Will increase Wellbutrin to 388m daily.  Follow up in 3 months for reevaluation.  Call sooner if concerns arise.      Relevant Medications   buPROPion (WELLBUTRIN XL) 300 MG 24 hr tablet   Other Visit Diagnoses     Plantar fasciitis of left foot       Recommend using a tennis ball in the mornings to help losen area.  Recommend using frozen water bottle under foot to help with inflammation.        Follow up plan: Return in about 3 months (around 10/19/2022) for HTN, HLD, DM2 FU.

## 2022-07-19 ENCOUNTER — Other Ambulatory Visit: Payer: Self-pay

## 2022-07-19 ENCOUNTER — Encounter: Payer: Self-pay | Admitting: Nurse Practitioner

## 2022-07-19 ENCOUNTER — Ambulatory Visit (INDEPENDENT_AMBULATORY_CARE_PROVIDER_SITE_OTHER): Payer: 59 | Admitting: Nurse Practitioner

## 2022-07-19 VITALS — BP 118/71 | HR 67 | Temp 98.3°F | Wt 262.7 lb

## 2022-07-19 DIAGNOSIS — I509 Heart failure, unspecified: Secondary | ICD-10-CM

## 2022-07-19 DIAGNOSIS — I519 Heart disease, unspecified: Secondary | ICD-10-CM | POA: Diagnosis not present

## 2022-07-19 DIAGNOSIS — I2510 Atherosclerotic heart disease of native coronary artery without angina pectoris: Secondary | ICD-10-CM | POA: Diagnosis not present

## 2022-07-19 DIAGNOSIS — E785 Hyperlipidemia, unspecified: Secondary | ICD-10-CM

## 2022-07-19 DIAGNOSIS — I700 Atherosclerosis of aorta: Secondary | ICD-10-CM

## 2022-07-19 DIAGNOSIS — E1140 Type 2 diabetes mellitus with diabetic neuropathy, unspecified: Secondary | ICD-10-CM

## 2022-07-19 DIAGNOSIS — I200 Unstable angina: Secondary | ICD-10-CM

## 2022-07-19 DIAGNOSIS — M722 Plantar fascial fibromatosis: Secondary | ICD-10-CM

## 2022-07-19 DIAGNOSIS — F330 Major depressive disorder, recurrent, mild: Secondary | ICD-10-CM

## 2022-07-19 DIAGNOSIS — E1169 Type 2 diabetes mellitus with other specified complication: Secondary | ICD-10-CM

## 2022-07-19 DIAGNOSIS — I10 Essential (primary) hypertension: Secondary | ICD-10-CM | POA: Diagnosis not present

## 2022-07-19 MED ORDER — BUPROPION HCL ER (XL) 300 MG PO TB24
300.0000 mg | ORAL_TABLET | Freq: Every day | ORAL | 0 refills | Status: DC
  Filled 2022-07-19: qty 30, 30d supply, fill #0

## 2022-07-19 MED ORDER — SEMAGLUTIDE (1 MG/DOSE) 4 MG/3ML ~~LOC~~ SOPN
1.0000 mg | PEN_INJECTOR | SUBCUTANEOUS | 1 refills | Status: DC
Start: 2022-07-19 — End: 2022-10-21
  Filled 2022-07-19: qty 3, 28d supply, fill #0
  Filled 2022-08-15: qty 3, 28d supply, fill #1
  Filled 2022-09-16: qty 3, 28d supply, fill #2

## 2022-07-19 MED ORDER — OMEPRAZOLE 20 MG PO CPDR
20.0000 mg | DELAYED_RELEASE_CAPSULE | Freq: Two times a day (BID) | ORAL | 1 refills | Status: DC
  Filled 2022-07-19: qty 180, 90d supply, fill #0
  Filled 2022-11-27: qty 180, 90d supply, fill #1

## 2022-07-19 NOTE — Assessment & Plan Note (Signed)
Chronic.  Controlled at 7.2.  Sugars are running 100-130.  Lost about 15lbs since last visit.  Continue with current medication regimen.  Labs ordered today.  Return to clinic in 3 months for reevaluation.  Call sooner if concerns arise.

## 2022-07-19 NOTE — Assessment & Plan Note (Signed)
Chronic. Doing okay.  Will increase Wellbutrin to '300mg'$  daily.  Follow up in 3 months for reevaluation.  Call sooner if concerns arise.

## 2022-07-19 NOTE — Assessment & Plan Note (Signed)
Chronic.  Controlled.  Continue with current medication regimen.  He is back on the Plavix.  Agrees to take Atorvastatin 1.5 x weekly.  Labs ordered today.  Return to clinic in 3 months for reevaluation.  Call sooner if concerns arise.

## 2022-07-19 NOTE — Assessment & Plan Note (Signed)
Has not seen his cardiologist since 2021. Discussed importance of making an appt with Cardiology for reevaluation.

## 2022-07-19 NOTE — Assessment & Plan Note (Signed)
Tolerating the pravastatin weekly well. Agrees to increase to 1.5x weekly.  Labs ordered today. Continue to monitor.

## 2022-07-19 NOTE — Assessment & Plan Note (Signed)
Has not seen his cardiologist since 2021. Discussed importance of making an appt with Cardiology for reevaluation.. BP good- hold on imdur until he sees cardiology to avoid hypotension.

## 2022-07-20 LAB — HEMOGLOBIN A1C
Est. average glucose Bld gHb Est-mCnc: 131 mg/dL
Hgb A1c MFr Bld: 6.2 % — ABNORMAL HIGH (ref 4.8–5.6)

## 2022-07-20 LAB — COMPREHENSIVE METABOLIC PANEL
ALT: 16 IU/L (ref 0–44)
AST: 22 IU/L (ref 0–40)
Albumin/Globulin Ratio: 1.8 (ref 1.2–2.2)
Albumin: 4.2 g/dL (ref 3.9–4.9)
Alkaline Phosphatase: 84 IU/L (ref 44–121)
BUN/Creatinine Ratio: 10 (ref 10–24)
BUN: 13 mg/dL (ref 8–27)
Bilirubin Total: 0.2 mg/dL (ref 0.0–1.2)
CO2: 21 mmol/L (ref 20–29)
Calcium: 9.4 mg/dL (ref 8.6–10.2)
Chloride: 102 mmol/L (ref 96–106)
Creatinine, Ser: 1.31 mg/dL — ABNORMAL HIGH (ref 0.76–1.27)
Globulin, Total: 2.3 g/dL (ref 1.5–4.5)
Glucose: 144 mg/dL — ABNORMAL HIGH (ref 70–99)
Potassium: 5 mmol/L (ref 3.5–5.2)
Sodium: 141 mmol/L (ref 134–144)
Total Protein: 6.5 g/dL (ref 6.0–8.5)
eGFR: 61 mL/min/{1.73_m2} (ref >59)

## 2022-07-22 NOTE — Progress Notes (Signed)
HI Steven Vang. It was nice to see you yesterday.  Your lab work looks good.  A1c improved from 7.4 to 6.4.  Your diet changes are paying off.  Your kidney function was slightly elevated.  We will recheck this at our next visit.  Make sure to drink at least 64 ounces of water daily.  Continue with your current medication regimen.  Follow up as discussed.  Please let me know if you have any questions.

## 2022-07-29 ENCOUNTER — Other Ambulatory Visit: Payer: Self-pay

## 2022-08-07 ENCOUNTER — Other Ambulatory Visit: Payer: Self-pay | Admitting: Nurse Practitioner

## 2022-08-07 ENCOUNTER — Other Ambulatory Visit: Payer: Self-pay

## 2022-08-07 DIAGNOSIS — R600 Localized edema: Secondary | ICD-10-CM

## 2022-08-07 NOTE — Telephone Encounter (Signed)
Medication Refill - Medication: furosemide (LASIX) 20 MG tablet, buPROPion (WELLBUTRIN XL) 300 MG 24 hr tablet   Has the patient contacted their pharmacy? Yes.     Preferred Pharmacy (with phone number or street name):  [facility] Phone:  [phone]  Fax:  [phone]     Has the patient been seen for an appointment in the last year OR does the patient have an upcoming appointment? Yes.

## 2022-08-08 ENCOUNTER — Other Ambulatory Visit: Payer: Self-pay

## 2022-08-08 MED ORDER — BUPROPION HCL ER (XL) 300 MG PO TB24
300.0000 mg | ORAL_TABLET | Freq: Every day | ORAL | 0 refills | Status: DC
  Filled 2022-08-08: qty 30, 30d supply, fill #0

## 2022-08-08 NOTE — Telephone Encounter (Signed)
Requested medication (s) are due for refill today: no  Requested medication (s) are on the active medication list:yes  Last refill:  04/19/22 and 07/19/22  Future visit scheduled: yes  Notes to clinic:  Unable to refill per protocol, request too soon.Last refill for Lasix was 04/19/22, 90 and 1 RF. Last refill for Wellbutrin was 07/19/22 for 30 days.     Requested Prescriptions  Pending Prescriptions Disp Refills   furosemide (LASIX) 20 MG tablet 90 tablet 1    Sig: Hold until followup with your outpatient doctor because your blood pressure was normal without it and you had acute kidney injury.     Cardiovascular:  Diuretics - Loop Failed - 08/07/2022  5:49 PM      Failed - Cr in normal range and within 180 days    Creatinine  Date Value Ref Range Status  07/15/2014 1.07 0.60 - 1.30 mg/dL Final   Creatinine, Ser  Date Value Ref Range Status  07/19/2022 1.31 (H) 0.76 - 1.27 mg/dL Final         Failed - Mg Level in normal range and within 180 days    Magnesium  Date Value Ref Range Status  10/19/2020 2.2 1.7 - 2.4 mg/dL Final    Comment:    Performed at [facility], [address] [place], [place] [address]         [place] in normal range and within 180 days    Potassium  Date Value Ref Range Status  07/19/2022 5.0 3.5 - 5.2 mmol/L Final  07/15/2014 4.4 3.5 - 5.1 mmol/L Final         Passed - Ca in normal range and within 180 days    Calcium  Date Value Ref Range Status  07/19/2022 9.4 8.6 - 10.2 mg/dL Final   Calcium, Total  Date Value Ref Range Status  07/15/2014 8.3 (L) 8.5 - 10.1 mg/dL Final         Passed - Na in normal range and within 180 days    Sodium  Date Value Ref Range Status  07/19/2022 141 134 - 144 mmol/L Final  07/15/2014 144 136 - 145 mmol/L Final         Passed - Cl in normal range and within 180 days    Chloride  Date Value Ref Range Status  07/19/2022 102 96 - 106 mmol/L Final  07/15/2014 111 (H) 98 - 107 mmol/L Final          Passed - Last BP in normal range    BP Readings from Last 1 Encounters:  07/19/22 118/71         Passed - Valid encounter within last 6 months    Recent Outpatient Visits           2 weeks ago Unstable angina ([place])   [facility] Jon Billings, NP   2 months ago Coronary artery disease involving native coronary artery of native heart with unstable angina pectoris [facility])   [place], Megan P, DO   3 months ago Congestive heart failure, unspecified HF chronicity, unspecified heart failure type ([place])   [place], Karen, NP   8 months ago Essential hypertension   [facility] Jon Billings, NP   9 months ago Annual physical exam   [facility] Jon Billings, NP       Future Appointments             In 2 months  Jon Billings, NP [facility]             buPROPion (WELLBUTRIN XL) 300 MG 24 hr tablet 30 tablet 0    Sig: Take 1 tablet (300 mg total) by mouth daily.     Psychiatry: Antidepressants - bupropion Failed - 08/07/2022  5:49 PM      Failed - Cr in normal range and within 360 days    Creatinine  Date Value Ref Range Status  07/15/2014 1.07 0.60 - 1.30 mg/dL Final   Creatinine, Ser  Date Value Ref Range Status  07/19/2022 1.31 (H) 0.76 - 1.27 mg/dL Final         Passed - AST in normal range and within 360 days    AST  Date Value Ref Range Status  07/19/2022 22 0 - 40 IU/L Final         Passed - ALT in normal range and within 360 days    ALT  Date Value Ref Range Status  07/19/2022 16 0 - 44 IU/L Final         Passed - Completed PHQ-2 or PHQ-9 in the last 360 days      Passed - Last BP in normal range    BP Readings from Last 1 Encounters:  07/19/22 118/71         Passed - Valid encounter within last 6 months    Recent Outpatient Visits           2 weeks ago Unstable angina ([place])   [facility] Jon Billings, NP   2  months ago Coronary artery disease involving native coronary artery of native heart with unstable angina pectoris [facility])   [place], Megan P, DO   3 months ago Congestive heart failure, unspecified HF chronicity, unspecified heart failure type ([place])   [place], Karen, NP   8 months ago Essential hypertension   [facility] Jon Billings, NP   9 months ago Annual physical exam   [facility] Jon Billings, NP       Future Appointments             In 2 months Jon Billings, NP [facility], [place]

## 2022-08-08 NOTE — Telephone Encounter (Signed)
Only Bupropion is due.

## 2022-08-12 ENCOUNTER — Other Ambulatory Visit: Payer: Self-pay

## 2022-08-15 ENCOUNTER — Other Ambulatory Visit: Payer: Self-pay

## 2022-08-31 IMAGING — DX DG CHEST 1V PORT
1 series · 1 of 1 positions shown · non-contrast
Comparison: Chest x-ray 10/01/2020.

CLINICAL DATA: 64-year-old male with history of pneumonia.

EXAM:
PORTABLE CHEST 1 VIEW

[chest ap]
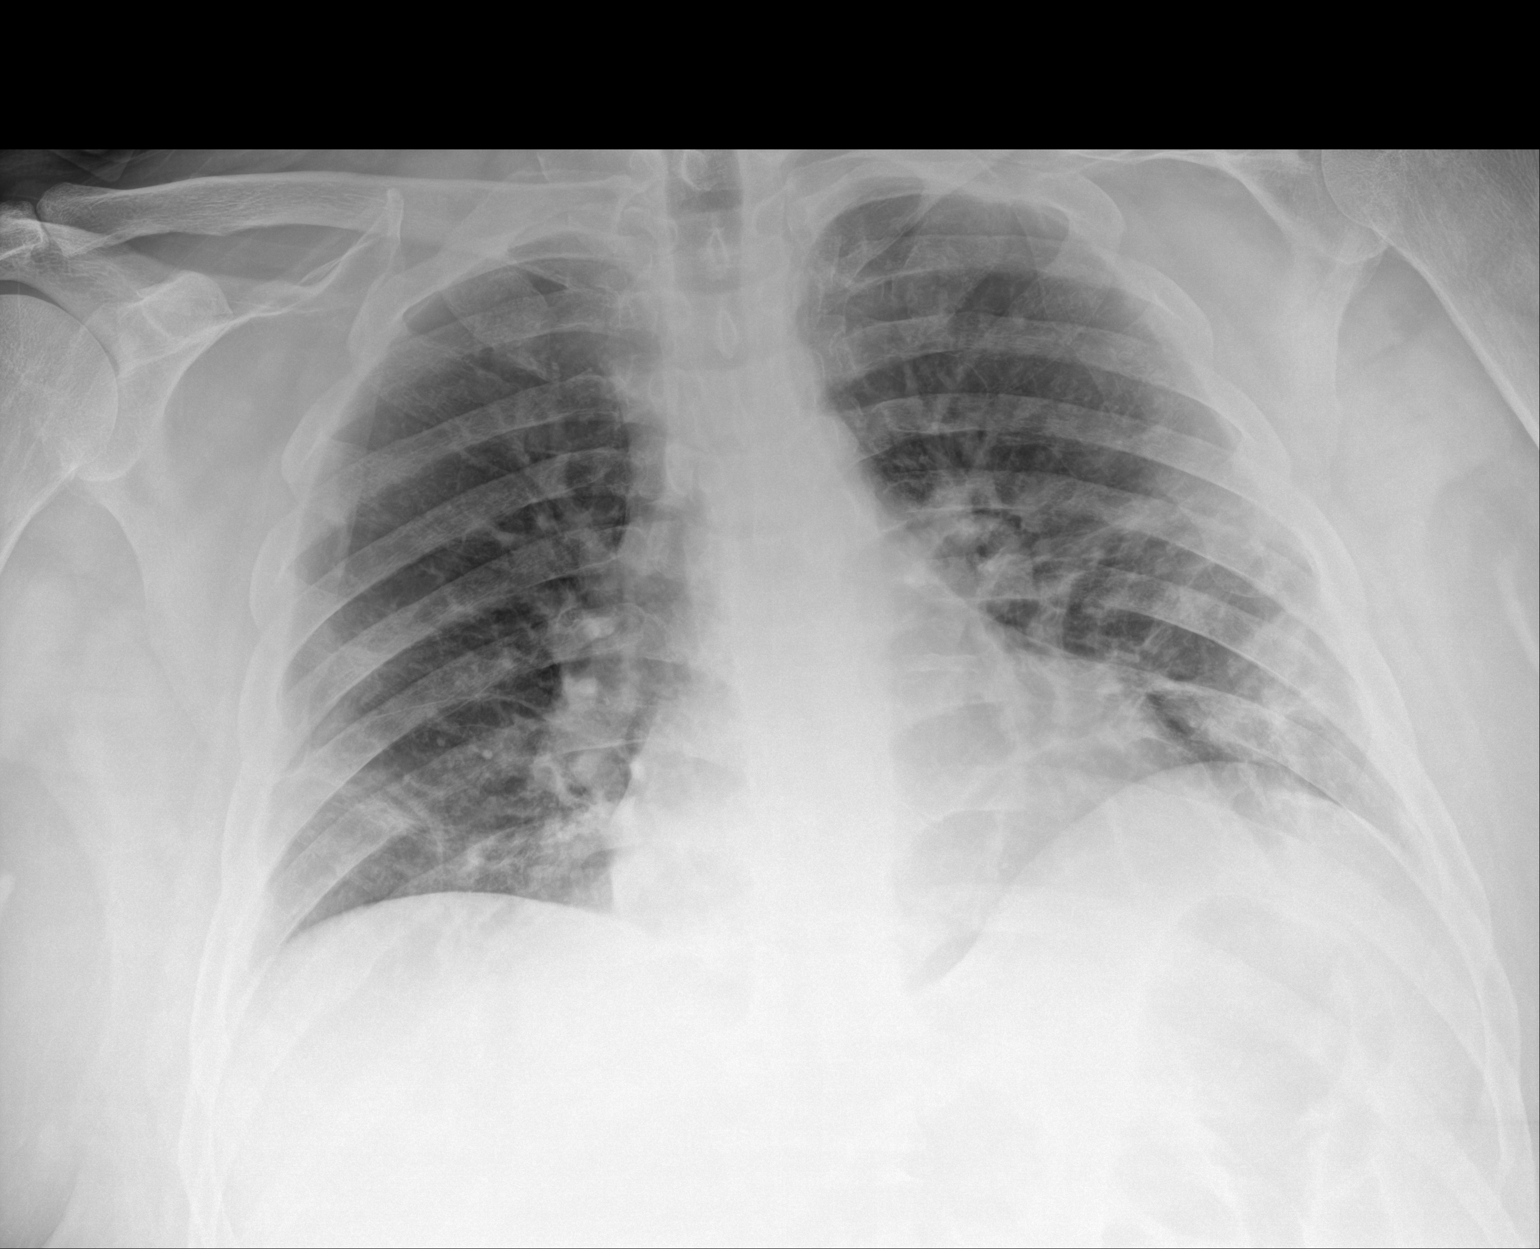

[1 of 1 positions shown; findings below may reference images not displayed]

FINDINGS: Lung volumes are slightly low. There continues to be patchy
ill-defined opacities and areas of interstitial prominence
throughout the mid to lower lungs bilaterally, most compatible with
multilobar pneumonia. Overall, there has been little change in
aeration. No pleural effusions. No pneumothorax. No evidence of
pulmonary edema. Heart size is normal. Upper mediastinal contours
are within normal limits.
IMPRESSION: 1. Overall, the radiographic appearance the chest is very similar to
the prior study with persistent evidence of multilobar bilateral
pneumonia and/or developing post infectious fibrosis.

## 2022-09-16 ENCOUNTER — Other Ambulatory Visit: Payer: Self-pay

## 2022-09-27 ENCOUNTER — Telehealth: Payer: Self-pay | Admitting: Nurse Practitioner

## 2022-09-27 DIAGNOSIS — I200 Unstable angina: Secondary | ICD-10-CM

## 2022-09-27 NOTE — Telephone Encounter (Signed)
New referral placed for Cardiology.

## 2022-10-01 DIAGNOSIS — E119 Type 2 diabetes mellitus without complications: Secondary | ICD-10-CM | POA: Diagnosis not present

## 2022-10-01 DIAGNOSIS — H35013 Changes in retinal vascular appearance, bilateral: Secondary | ICD-10-CM | POA: Diagnosis not present

## 2022-10-01 DIAGNOSIS — H5213 Myopia, bilateral: Secondary | ICD-10-CM | POA: Diagnosis not present

## 2022-10-01 DIAGNOSIS — H35363 Drusen (degenerative) of macula, bilateral: Secondary | ICD-10-CM | POA: Diagnosis not present

## 2022-10-01 LAB — HM DIABETES EYE EXAM

## 2022-10-10 ENCOUNTER — Other Ambulatory Visit: Payer: Self-pay | Admitting: Nurse Practitioner

## 2022-10-10 ENCOUNTER — Other Ambulatory Visit: Payer: Self-pay

## 2022-10-10 MED ORDER — BUPROPION HCL ER (XL) 300 MG PO TB24
300.0000 mg | ORAL_TABLET | Freq: Every day | ORAL | 0 refills | Status: DC
  Filled 2022-10-10: qty 30, 30d supply, fill #0

## 2022-10-10 NOTE — Telephone Encounter (Signed)
Requested Prescriptions  Pending Prescriptions Disp Refills  . buPROPion (WELLBUTRIN XL) 300 MG 24 hr tablet 30 tablet 0    Sig: Take 1 tablet (300 mg total) by mouth daily.     Psychiatry: Antidepressants - bupropion Failed - 10/10/2022 10:19 AM      Failed - Cr in normal range and within 360 days    Creatinine  Date Value Ref Range Status  07/15/2014 1.07 0.60 - 1.30 mg/dL Final   Creatinine, Ser  Date Value Ref Range Status  07/19/2022 1.31 (H) 0.76 - 1.27 mg/dL Final         Passed - AST in normal range and within 360 days    AST  Date Value Ref Range Status  07/19/2022 22 0 - 40 IU/L Final         Passed - ALT in normal range and within 360 days    ALT  Date Value Ref Range Status  07/19/2022 16 0 - 44 IU/L Final         Passed - Completed PHQ-2 or PHQ-9 in the last 360 days      Passed - Last BP in normal range    BP Readings from Last 1 Encounters:  07/19/22 118/71         Passed - Valid encounter within last 6 months    Recent Outpatient Visits          2 months ago Unstable angina ([place])   [facility] Jon Billings, NP   4 months ago Coronary artery disease involving native coronary artery of native heart with unstable angina pectoris [facility])   [place], Megan P, DO   5 months ago Congestive heart failure, unspecified HF chronicity, unspecified heart failure type [facility])   [facility] Jon Billings, NP   10 months ago Essential hypertension   [facility] Jon Billings, NP   11 months ago Annual physical exam   [facility] Jon Billings, NP      Future Appointments            In 1 week Jon Billings, NP [facility], [place]

## 2022-10-11 ENCOUNTER — Other Ambulatory Visit: Payer: Self-pay

## 2022-10-17 NOTE — Progress Notes (Signed)
BP 120/72   Pulse 62   Temp 98.2 F (36.8 C) (Oral)   Wt 252 lb 14.4 oz (114.7 kg)   SpO2 98%   BMI 36.29 kg/m    Subjective:    Patient ID: Steven Vang, male    DOB: [birth year], 66 y.o.   MRN: [id number]  HPI: Steven Vang is a 66 y.o. male presenting on 10/21/2022 for comprehensive medical examination. Current medical complaints include: none  He currently lives with: Interim Problems from his last visit: no  HYPERTENSION / HYPERLIPIDEMIA Has an appt with Dr. Josefa Half on November 11 Satisfied with current treatment? yes Duration of hypertension: years BP monitoring frequency: not checking BP range:  BP medication side effects: no Past BP meds:  Metoprolol and losartan (cozaar) - Imdur gives him a headache Duration of hyperlipidemia: years Cholesterol medication side effects: no Cholesterol supplements: none Past cholesterol medications: Pravastatin- taking weekly Medication compliance: good compliance Aspirin: no Recent stressors: no Recurrent headaches: no Visual changes: no Palpitations: no Dyspnea: no Chest pain: sometimes Lower extremity edema: no Dizzy/lightheaded: no  DIABETES Hypoglycemic episodes:no Polydipsia/polyuria: no Visual disturbance: no Chest pain: no Paresthesias: no Glucose Monitoring: no  Accucheck frequency: Not Checking  Fasting glucose:  Post prandial:  Evening:  Before meals: Taking Insulin?: no  Long acting insulin:  Short acting insulin: Blood Pressure Monitoring: not checking Retinal Examination: Not up to Date Foot Exam: Up to Date Diabetic Education: Not Completed Pneumovax: Not up to Date Influenza: Up to Date Aspirin: no  ANXIETY/DEPRESSION Patient states his mood has been alright.  Has his days when he is down.  But feels like he is doing well.   NUMBNESS/ FOOT PAIN Patient has ben taking the Gabapentin.  Feels like it is working well.   Depression Screen done today and results listed below:      07/19/2022   11:07 AM 05/31/2022   11:42 AM 04/19/2022    8:15 AM 11/16/2021   11:31 AM 10/17/2021   10:43 AM  Depression screen PHQ 2/9  Decreased Interest '1 1 2 '$ 0 1  Down, Depressed, Hopeless 1 0 2 0 0  PHQ - 2 Score '2 1 4 '$ 0 1  Altered sleeping 2 0 2 0 1  Tired, decreased energy '2 1 3 1 1  '$ Change in appetite '1 1 2 '$ 0 2  Feeling bad or failure about yourself  1 0 1 0 0  Trouble concentrating 0 0 2 0 0  Moving slowly or fidgety/restless 0 2 0 1 1  Suicidal thoughts 0 0 0 0 0  PHQ-9 Score '8 5 14 2 6  '$ Difficult doing work/chores Somewhat difficult Not difficult at all Not difficult at all      The patient does not have a history of falls. I did complete a risk assessment for falls. A plan of care for falls was documented.   Past Medical History:  Past Medical History:  Diagnosis Date   Abnormal EKG    Anxiety disorder    CAD (coronary artery disease)    CHF (congestive heart failure) (HCC)    Chronic systolic heart failure (HCC)    Depression    Diabetes mellitus (HCC)    GERD (gastroesophageal reflux disease)    Hypertension    Lipoma of head 03/31/2020   Mixed hyperlipidemia    Non morbid obesity due to excess calories     Surgical History:  Past Surgical History:  Procedure Laterality Date   ANKLE FRACTURE SURGERY Right  CARDIAC CATHETERIZATION     CHOLECYSTECTOMY     COLONOSCOPY N/A 11/28/2021   Procedure: COLONOSCOPY;  Surgeon: Lin Landsman, MD;  Location: [facility] ENDOSCOPY;  Service: Gastroenterology;  Laterality: N/A;   CORONARY STENT PLACEMENT  2015   3 stents   HAND SURGERY Right    LEFT HEART CATH AND CORONARY ANGIOGRAPHY Left 06/02/2020   Procedure: LEFT HEART CATH AND CORONARY ANGIOGRAPHY;  Surgeon: Dionisio David, MD;  Location: [facility];  Service: Cardiovascular;  Laterality: Left;    Medications:  Current Outpatient Medications on File Prior to Visit  Medication Sig   aspirin 81 MG EC tablet Take 1 tablet by mouth daily.   citalopram  (CELEXA) 40 MG tablet Take 1 tablet (40 mg total) by mouth daily.   clopidogrel (PLAVIX) 75 MG tablet Take 1 tablet (75 mg total) by mouth at bedtime.   furosemide (LASIX) 20 MG tablet Hold until followup with your outpatient doctor because your blood pressure was normal without it and you had acute kidney injury.   gabapentin (NEURONTIN) 300 MG capsule Take 2 capsules (600 mg total) by mouth at bedtime as needed.   losartan (COZAAR) 50 MG tablet Take 1 tablet (50 mg total) by mouth daily.   metoprolol succinate (TOPROL-XL) 50 MG 24 hr tablet Take 1 tablet (50 mg total) by mouth daily.   omeprazole (PRILOSEC) 20 MG capsule Take 1 capsule (20 mg total) by mouth 2 (two) times daily before a meal.   pravastatin (PRAVACHOL) 80 MG tablet Take 1 tablet (80 mg total) by mouth once a week. Take 1 tab once weekly   traZODone (DESYREL) 50 MG tablet TAKE 1 TABLET BY MOUTH ONCE DAILY AT BEDTIME AS NEEDED   isosorbide mononitrate (IMDUR) 30 MG 24 hr tablet Hold until followup with your outpatient doctor because your blood pressure was low normal. (Patient not taking: Reported on 07/19/2022)   No current facility-administered medications on file prior to visit.    Allergies:  No Known Allergies  Social History:  Social History   Socioeconomic History   Marital status: Married    Spouse name: Not on file   Number of children: Not on file   Years of education: Not on file   Highest education level: Not on file  Occupational History   Not on file  Tobacco Use   Smoking status: Former    Types: Cigarettes    Quit date: 1990    Years since quitting: 33.8   Smokeless tobacco: Never  Vaping Use   Vaping Use: Never used  Substance and Sexual Activity   Alcohol use: Not Currently   Drug use: Never   Sexual activity: Not on file  Other Topics Concern   Not on file  Social History Narrative   Not on file   Social Determinants of Health   Financial Resource Strain: Not on file  Food Insecurity:  Not on file  Transportation Needs: Not on file  Physical Activity: Not on file  Stress: Not on file  Social Connections: Not on file  Intimate Partner Violence: Not on file   Social History   Tobacco Use  Smoking Status Former   Types: Cigarettes   Quit date: 1990   Years since quitting: 33.8  Smokeless Tobacco Never   Social History   Substance and Sexual Activity  Alcohol Use Not Currently    Family History:  Family History  Problem Relation Age of Onset   CAD Mother    Diabetes  Mother    Colon cancer Father    Breast cancer Paternal Grandmother    Stomach cancer Paternal Grandfather     Past medical history, surgical history, medications, allergies, family history and social history reviewed with patient today and changes made to appropriate areas of the chart.   Review of Systems  HENT:         Denies vision changes.  Eyes:  Negative for blurred vision and double vision.  Respiratory:  Negative for shortness of breath.   Cardiovascular:  Positive for chest pain. Negative for palpitations and leg swelling.  Musculoskeletal:        Foot pain  Neurological:  Negative for dizziness, tingling and headaches.  Endo/Heme/Allergies:  Negative for polydipsia.       Denies Polyuria   All other ROS negative except what is listed above and in the HPI.      Objective:    BP 120/72   Pulse 62   Temp 98.2 F (36.8 C) (Oral)   Wt 252 lb 14.4 oz (114.7 kg)   SpO2 98%   BMI 36.29 kg/m   Wt Readings from Last 3 Encounters:  10/21/22 252 lb 14.4 oz (114.7 kg)  07/19/22 262 lb 11.2 oz (119.2 kg)  05/31/22 262 lb (118.8 kg)    Physical Exam Vitals and nursing note reviewed.  Constitutional:      General: He is not in acute distress.    Appearance: Normal appearance. He is obese. He is not ill-appearing, toxic-appearing or diaphoretic.  HENT:     Head: Normocephalic.     Right Ear: Tympanic membrane, ear canal and external ear normal.     Left Ear: Tympanic  membrane, ear canal and external ear normal.     Nose: Nose normal. No congestion or rhinorrhea.     Mouth/Throat:     Mouth: Mucous membranes are moist.  Eyes:     General:        Right eye: No discharge.        Left eye: No discharge.     Extraocular Movements: Extraocular movements intact.     Conjunctiva/sclera: Conjunctivae normal.     Pupils: Pupils are equal, round, and reactive to light.  Cardiovascular:     Rate and Rhythm: Normal rate and regular rhythm.     Heart sounds: No murmur heard. Pulmonary:     Effort: Pulmonary effort is normal. No respiratory distress.     Breath sounds: Normal breath sounds. No wheezing, rhonchi or rales.  Abdominal:     General: Abdomen is flat. Bowel sounds are normal. There is no distension.     Palpations: Abdomen is soft.     Tenderness: There is no abdominal tenderness. There is no guarding.  Musculoskeletal:     Cervical back: Normal range of motion and neck supple.  Skin:    General: Skin is warm and dry.     Capillary Refill: Capillary refill takes less than 2 seconds.  Neurological:     General: No focal deficit present.     Mental Status: He is alert and oriented to person, place, and time.     Cranial Nerves: No cranial nerve deficit.     Motor: No weakness.     Deep Tendon Reflexes: Reflexes normal.  Psychiatric:        Mood and Affect: Mood normal.        Behavior: Behavior normal.        Thought Content: Thought content normal.  Judgment: Judgment normal.     Results for orders placed or performed in visit on 10/01/22  HM DIABETES EYE EXAM  Result Value Ref Range   HM Diabetic Eye Exam No Retinopathy No Retinopathy      Assessment & Plan:   Problem List Items Addressed This Visit       Cardiovascular and Mediastinum   Coronary artery disease    Chronic. Has follow up scheduled with Dr. Josefa Half later this month.  Continue with current medication regimen.  Recommend discussing Imdur with Cardiologist.   Follow up in 3 months.  Call sooner if concerns arise.       Essential hypertension    Chronic.  Controlled.  Continue with current medication regimen of Losartan and Metoprolol.  Labs ordered today.  Return to clinic in 3 months for reevaluation.  Call sooner if concerns arise.        Left ventricular dysfunction    Chronic. On medications besides Imdur.  Has follow up with Dr. Josefa Half this month.  Continue with current medication regimen.  Follow up in 3 months.  Call sooner if concerns arise.   - Reminded to call for an overnight weight gain of >2 pounds or a weekly weight gain of >5 pounds - not adding salt to food and read food labels. Reviewed the importance of keeping daily sodium intake to '2000mg'$  daily. - Avoid Ibuprofen products.      Unstable angina (HCC)    Chronic. Has follow up scheduled with Dr. Josefa Half later this month.  Continue with current medication regimen.  Recommend discussing Imdur with Cardiologist.  Follow up in 3 months.  Call sooner if concerns arise.       Aortic atherosclerosis (HCC)    Chronic.  Controlled.  Continue with current medication regimen.  He is back on the Plavix.  On Pravastatin weekly. Labs ordered today.  Return to clinic in 3 months for reevaluation.  Call sooner if concerns arise.        CHF (congestive heart failure) (HCC)    Chronic. On medications besides Imdur.  Has follow up with Dr. Josefa Half this month.  Continue with current medication regimen.  Follow up in 3 months.  Call sooner if concerns arise.   - Reminded to call for an overnight weight gain of >2 pounds or a weekly weight gain of >5 pounds - not adding salt to food and read food labels. Reviewed the importance of keeping daily sodium intake to '2000mg'$  daily. - Avoid Ibuprofen products.         Endocrine   Hyperlipidemia associated with type 2 diabetes mellitus (HCC)    Chronic.  Controlled.  Continue with current medication regimen of Pravastatin weekly.  Labs  ordered today.  Return to clinic in 3 months for reevaluation.  Call sooner if concerns arise.        Relevant Medications   Semaglutide, 1 MG/DOSE, 4 MG/3ML SOPN   Other Relevant Orders   Lipid panel   Type 2 diabetes mellitus with diabetic neuropathy, unspecified (HCC)    Chronic.  Controlled at 6.2.  Sugars are running 100-130.  Lost about 10lbs since last visit.  Continue with current medication regimen.  Labs ordered today.  Return to clinic in 3 months for reevaluation.  Call sooner if concerns arise.        Relevant Medications   Semaglutide, 1 MG/DOSE, 4 MG/3ML SOPN   Other Relevant Orders   HgB A1c   Urine Microalbumin w/creat. ratio  Other   Mild episode of recurrent major depressive disorder (HCC)    Chronic.  Controlled.  Continue with current medication regimen of Celexa and Wellbutrin.  Labs ordered today.  Return to clinic in 6 months for reevaluation.  Call sooner if concerns arise.        Relevant Medications   buPROPion (WELLBUTRIN XL) 300 MG 24 hr tablet   Obesity (BMI 30-39.9)    Recommend a healthy lifestyle through diet and exercise.       Relevant Medications   Semaglutide, 1 MG/DOSE, 4 MG/3ML SOPN   Other Visit Diagnoses     Annual physical exam    -  Primary   Health maintenance reviewed during visit today.  Labs ordered today. Flu and Pneumonia given at visit today.  Labs ordered today.  Colonoscopy up to date.   Relevant Orders   TSH   PSA   Lipid panel   CBC with Differential/Platelet   Comprehensive metabolic panel   Urinalysis, Routine w reflex microscopic   Need for influenza vaccination       Relevant Orders   Flu Vaccine QUAD High Dose(Fluad) (Completed)   Need for pneumococcal 20-valent conjugate vaccination       Relevant Orders   Pneumococcal conjugate vaccine 20-valent (Prevnar 20) (Completed)        Discussed aspirin prophylaxis for myocardial infarction prevention and decision was made to continue ASA  LABORATORY  TESTING:  Health maintenance labs ordered today as discussed above.   The natural history of prostate cancer and ongoing controversy regarding screening and potential treatment outcomes of prostate cancer has been discussed with the patient. The meaning of a false positive PSA and a false negative PSA has been discussed. He indicates understanding of the limitations of this screening test and wishes to proceed with screening PSA testing.   IMMUNIZATIONS:   - Tdap: Tetanus vaccination status reviewed: thinks he already received it at the hospital. - Influenza: Administered today - Pneumovax: up to date - Prevnar: Not applicable - HPV: Not applicable - Zostavax vaccine:  got the first one and does not want to get the second  SCREENING: - Colonoscopy: Up to date Discussed with patient purpose of the colonoscopy is to detect colon cancer at curable precancerous or early stages   - AAA Screening: Not applicable  -Hearing Test: Not applicable  -Spirometry: Not applicable   PATIENT COUNSELING:    Sexuality: Discussed sexually transmitted diseases, partner selection, use of condoms, avoidance of unintended pregnancy  and contraceptive alternatives.   Advised to avoid cigarette smoking.  I discussed with the patient that most people either abstain from alcohol or drink within safe limits (<=14/week and <=4 drinks/occasion for males, <=7/weeks and <= 3 drinks/occasion for females) and that the risk for alcohol disorders and other health effects rises proportionally with the number of drinks per week and how often a drinker exceeds daily limits.  Discussed cessation/primary prevention of drug use and availability of treatment for abuse.   Diet: Encouraged to adjust caloric intake to maintain  or achieve ideal body weight, to reduce intake of dietary saturated fat and total fat, to limit sodium intake by avoiding high sodium foods and not adding table salt, and to maintain adequate dietary  potassium and calcium preferably from fresh fruits, vegetables, and low-fat dairy products.    stressed the importance of regular exercise  Injury prevention: Discussed safety belts, safety helmets, smoke detector, smoking near bedding or upholstery.   Dental health: Discussed importance of  regular tooth brushing, flossing, and dental visits.   Follow up plan: NEXT PREVENTATIVE PHYSICAL DUE IN 1 YEAR. Return in about 3 months (around 01/21/2023) for HTN, HLD, DM2 FU.

## 2022-10-21 ENCOUNTER — Encounter: Payer: Self-pay | Admitting: Nurse Practitioner

## 2022-10-21 ENCOUNTER — Ambulatory Visit: Payer: 59 | Admitting: Nurse Practitioner

## 2022-10-21 ENCOUNTER — Other Ambulatory Visit: Payer: Self-pay

## 2022-10-21 VITALS — BP 120/72 | HR 62 | Temp 98.2°F | Wt 252.9 lb

## 2022-10-21 DIAGNOSIS — I2510 Atherosclerotic heart disease of native coronary artery without angina pectoris: Secondary | ICD-10-CM

## 2022-10-21 DIAGNOSIS — I509 Heart failure, unspecified: Secondary | ICD-10-CM | POA: Diagnosis not present

## 2022-10-21 DIAGNOSIS — F330 Major depressive disorder, recurrent, mild: Secondary | ICD-10-CM | POA: Diagnosis not present

## 2022-10-21 DIAGNOSIS — I200 Unstable angina: Secondary | ICD-10-CM

## 2022-10-21 DIAGNOSIS — E1140 Type 2 diabetes mellitus with diabetic neuropathy, unspecified: Secondary | ICD-10-CM | POA: Diagnosis not present

## 2022-10-21 DIAGNOSIS — E1169 Type 2 diabetes mellitus with other specified complication: Secondary | ICD-10-CM | POA: Diagnosis not present

## 2022-10-21 DIAGNOSIS — I519 Heart disease, unspecified: Secondary | ICD-10-CM

## 2022-10-21 DIAGNOSIS — I10 Essential (primary) hypertension: Secondary | ICD-10-CM | POA: Diagnosis not present

## 2022-10-21 DIAGNOSIS — E785 Hyperlipidemia, unspecified: Secondary | ICD-10-CM

## 2022-10-21 DIAGNOSIS — E669 Obesity, unspecified: Secondary | ICD-10-CM

## 2022-10-21 DIAGNOSIS — I700 Atherosclerosis of aorta: Secondary | ICD-10-CM | POA: Diagnosis not present

## 2022-10-21 DIAGNOSIS — Z0000 Encounter for general adult medical examination without abnormal findings: Secondary | ICD-10-CM | POA: Diagnosis not present

## 2022-10-21 DIAGNOSIS — Z23 Encounter for immunization: Secondary | ICD-10-CM

## 2022-10-21 LAB — URINALYSIS, ROUTINE W REFLEX MICROSCOPIC
Bilirubin, UA: NEGATIVE
Glucose, UA: NEGATIVE
Ketones, UA: NEGATIVE
Leukocytes,UA: NEGATIVE
Nitrite, UA: NEGATIVE
Protein,UA: NEGATIVE
RBC, UA: NEGATIVE
Specific Gravity, UA: 1.01 (ref 1.005–1.030)
Urobilinogen, Ur: 0.2 mg/dL (ref 0.2–1.0)
pH, UA: 5.5 (ref 5.0–7.5)

## 2022-10-21 LAB — MICROALBUMIN, URINE WAIVED
Creatinine, Urine Waived: 50 mg/dL (ref 10–300)
Microalb, Ur Waived: 10 mg/L (ref 0–19)
Microalb/Creat Ratio: <30 mg/g (ref <30)

## 2022-10-21 MED ORDER — SEMAGLUTIDE (1 MG/DOSE) 4 MG/3ML ~~LOC~~ SOPN
1.0000 mg | PEN_INJECTOR | SUBCUTANEOUS | 1 refills | Status: DC
  Filled 2022-10-21: qty 3, 28d supply, fill #0
  Filled 2022-11-27: qty 3, 28d supply, fill #1

## 2022-10-21 MED ORDER — BUPROPION HCL ER (XL) 300 MG PO TB24
300.0000 mg | ORAL_TABLET | Freq: Every day | ORAL | 1 refills | Status: DC
  Filled 2022-10-21 – 2022-11-18 (×2): qty 90, 90d supply, fill #0

## 2022-10-21 NOTE — Assessment & Plan Note (Signed)
Chronic.  Controlled.  Continue with current medication regimen of Celexa and Wellbutrin.  Labs ordered today.  Return to clinic in 6 months for reevaluation.  Call sooner if concerns arise.

## 2022-10-21 NOTE — Assessment & Plan Note (Signed)
Chronic. On medications besides Imdur.  Has follow up with Dr. Josefa Half this month.  Continue with current medication regimen.  Follow up in 3 months.  Call sooner if concerns arise.   - Reminded to call for an overnight weight gain of >2 pounds or a weekly weight gain of >5 pounds - not adding salt to food and read food labels. Reviewed the importance of keeping daily sodium intake to '2000mg'$  daily. - Avoid Ibuprofen products.

## 2022-10-21 NOTE — Assessment & Plan Note (Signed)
Chronic.  Controlled.  Continue with current medication regimen.  He is back on the Plavix.  On Pravastatin weekly. Labs ordered today.  Return to clinic in 3 months for reevaluation.  Call sooner if concerns arise.

## 2022-10-21 NOTE — Assessment & Plan Note (Signed)
Chronic.  Controlled.  Continue with current medication regimen of Pravastatin weekly.  Labs ordered today.  Return to clinic in 3 months for reevaluation.  Call sooner if concerns arise.

## 2022-10-21 NOTE — Assessment & Plan Note (Signed)
Chronic.  Controlled at 6.2.  Sugars are running 100-130.  Lost about 10lbs since last visit.  Continue with current medication regimen.  Labs ordered today.  Return to clinic in 3 months for reevaluation.  Call sooner if concerns arise.

## 2022-10-21 NOTE — Assessment & Plan Note (Signed)
Chronic.  Controlled.  Continue with current medication regimen of Losartan and Metoprolol.  Labs ordered today.  Return to clinic in 3 months for reevaluation.  Call sooner if concerns arise.

## 2022-10-21 NOTE — Assessment & Plan Note (Signed)
Recommend a healthy lifestyle through diet and exercise.  °

## 2022-10-21 NOTE — Assessment & Plan Note (Signed)
Chronic. Has follow up scheduled with Dr. Josefa Half later this month.  Continue with current medication regimen.  Recommend discussing Imdur with Cardiologist.  Follow up in 3 months.  Call sooner if concerns arise.

## 2022-10-22 LAB — COMPREHENSIVE METABOLIC PANEL
ALT: 14 IU/L (ref 0–44)
AST: 18 IU/L (ref 0–40)
Albumin/Globulin Ratio: 2 (ref 1.2–2.2)
Albumin: 4.1 g/dL (ref 3.9–4.9)
Alkaline Phosphatase: 76 IU/L (ref 44–121)
BUN/Creatinine Ratio: 14 (ref 10–24)
BUN: 16 mg/dL (ref 8–27)
Bilirubin Total: 0.2 mg/dL (ref 0.0–1.2)
CO2: 22 mmol/L (ref 20–29)
Calcium: 9.2 mg/dL (ref 8.6–10.2)
Chloride: 105 mmol/L (ref 96–106)
Creatinine, Ser: 1.16 mg/dL (ref 0.76–1.27)
Globulin, Total: 2.1 g/dL (ref 1.5–4.5)
Glucose: 98 mg/dL (ref 70–99)
Potassium: 4.6 mmol/L (ref 3.5–5.2)
Sodium: 140 mmol/L (ref 134–144)
Total Protein: 6.2 g/dL (ref 6.0–8.5)
eGFR: 70 mL/min/{1.73_m2} (ref >59)

## 2022-10-22 LAB — CBC WITH DIFFERENTIAL/PLATELET
Basophils Absolute: 0.1 10*3/uL (ref 0.0–0.2)
Basos: 1 %
EOS (ABSOLUTE): 0.4 10*3/uL (ref 0.0–0.4)
Eos: 6 %
Hematocrit: 41.1 % (ref 37.5–51.0)
Hemoglobin: 13.6 g/dL (ref 13.0–17.7)
Immature Grans (Abs): 0 10*3/uL (ref 0.0–0.1)
Immature Granulocytes: 0 %
Lymphocytes Absolute: 1.5 10*3/uL (ref 0.7–3.1)
Lymphs: 21 %
MCH: 27.1 pg (ref 26.6–33.0)
MCHC: 33.1 g/dL (ref 31.5–35.7)
MCV: 82 fL (ref 79–97)
Monocytes Absolute: 0.4 10*3/uL (ref 0.1–0.9)
Monocytes: 6 %
Neutrophils Absolute: 4.6 10*3/uL (ref 1.4–7.0)
Neutrophils: 66 %
Platelets: 161 10*3/uL (ref 150–450)
RBC: 5.02 x10E6/uL (ref 4.14–5.80)
RDW: 13.8 % (ref 11.6–15.4)
WBC: 7 10*3/uL (ref 3.4–10.8)

## 2022-10-22 LAB — HEMOGLOBIN A1C
Est. average glucose Bld gHb Est-mCnc: 123 mg/dL
Hgb A1c MFr Bld: 5.9 % — ABNORMAL HIGH (ref 4.8–5.6)

## 2022-10-22 LAB — LIPID PANEL
Chol/HDL Ratio: 6.3 ratio — ABNORMAL HIGH (ref 0.0–5.0)
Cholesterol, Total: 196 mg/dL (ref 100–199)
HDL: 31 mg/dL — ABNORMAL LOW (ref >39)
LDL Chol Calc (NIH): 130 mg/dL — ABNORMAL HIGH (ref 0–99)
Triglycerides: 194 mg/dL — ABNORMAL HIGH (ref 0–149)
VLDL Cholesterol Cal: 35 mg/dL (ref 5–40)

## 2022-10-22 LAB — TSH: TSH: 2.39 u[IU]/mL (ref 0.450–4.500)

## 2022-10-22 LAB — PSA: Prostate Specific Ag, Serum: 2.3 ng/mL (ref 0.0–4.0)

## 2022-10-22 NOTE — Progress Notes (Signed)
Please let patient know that his lab work shows that his cholesterol has improved from prior.  Diet changes and taking the Pravastatin weekly is helping.  A1c is well controlled at 5.9.  Keep up the good work. Other lab work looks good.  No other concerns at this time.  Follow up as discussed.

## 2022-10-28 ENCOUNTER — Other Ambulatory Visit: Payer: Self-pay

## 2022-10-28 DIAGNOSIS — Z955 Presence of coronary angioplasty implant and graft: Secondary | ICD-10-CM | POA: Diagnosis not present

## 2022-10-28 DIAGNOSIS — E6609 Other obesity due to excess calories: Secondary | ICD-10-CM | POA: Diagnosis not present

## 2022-10-28 DIAGNOSIS — I10 Essential (primary) hypertension: Secondary | ICD-10-CM | POA: Diagnosis not present

## 2022-10-28 DIAGNOSIS — E782 Mixed hyperlipidemia: Secondary | ICD-10-CM | POA: Diagnosis not present

## 2022-10-28 DIAGNOSIS — I2510 Atherosclerotic heart disease of native coronary artery without angina pectoris: Secondary | ICD-10-CM | POA: Diagnosis not present

## 2022-10-28 MED ORDER — NITROGLYCERIN 0.4 MG SL SUBL
SUBLINGUAL_TABLET | SUBLINGUAL | 11 refills | Status: DC
  Filled 2022-10-28: qty 25, 8d supply, fill #0

## 2022-11-18 ENCOUNTER — Other Ambulatory Visit: Payer: Self-pay

## 2022-11-27 ENCOUNTER — Other Ambulatory Visit: Payer: Self-pay

## 2022-11-27 ENCOUNTER — Other Ambulatory Visit: Payer: Self-pay | Admitting: Nurse Practitioner

## 2022-11-27 DIAGNOSIS — I25110 Atherosclerotic heart disease of native coronary artery with unstable angina pectoris: Secondary | ICD-10-CM

## 2022-11-27 MED ORDER — CLOPIDOGREL BISULFATE 75 MG PO TABS
75.0000 mg | ORAL_TABLET | Freq: Every day | ORAL | 1 refills | Status: DC
  Filled 2022-11-27: qty 90, 90d supply, fill #0

## 2022-11-27 NOTE — Telephone Encounter (Signed)
Requested Prescriptions  Pending Prescriptions Disp Refills   clopidogrel (PLAVIX) 75 MG tablet 90 tablet 1    Sig: Take 1 tablet (75 mg total) by mouth at bedtime.     Hematology: Antiplatelets - clopidogrel Passed - 11/27/2022  8:30 AM      Passed - HCT in normal range and within 180 days    Hematocrit  Date Value Ref Range Status  10/21/2022 41.1 37.5 - 51.0 % Final         Passed - HGB in normal range and within 180 days    Hemoglobin  Date Value Ref Range Status  10/21/2022 13.6 13.0 - 17.7 g/dL Final         Passed - PLT in normal range and within 180 days    Platelets  Date Value Ref Range Status  10/21/2022 161 150 - 450 x10E3/uL Final         Passed - Cr in normal range and within 360 days    Creatinine  Date Value Ref Range Status  07/15/2014 1.07 0.60 - 1.30 mg/dL Final   Creatinine, Ser  Date Value Ref Range Status  10/21/2022 1.16 0.76 - 1.27 mg/dL Final         Passed - Valid encounter within last 6 months    Recent Outpatient Visits           1 month ago Annual physical exam   [facility] Jon Billings, NP   4 months ago Unstable angina [facility])   [facility] Jon Billings, NP   6 months ago Coronary artery disease involving native coronary artery of native heart with unstable angina pectoris [facility])   [place], Megan P, DO   7 months ago Congestive heart failure, unspecified HF chronicity, unspecified heart failure type [facility])   [place] Jon Billings, NP   1 year ago Essential hypertension   [facility] Jon Billings, NP       Future Appointments             In 1 month Jon Billings, NP [facility] [place]

## 2023-01-13 ENCOUNTER — Other Ambulatory Visit: Payer: Self-pay | Admitting: Nurse Practitioner

## 2023-01-13 DIAGNOSIS — I25110 Atherosclerotic heart disease of native coronary artery with unstable angina pectoris: Secondary | ICD-10-CM

## 2023-01-13 DIAGNOSIS — E1140 Type 2 diabetes mellitus with diabetic neuropathy, unspecified: Secondary | ICD-10-CM

## 2023-01-13 DIAGNOSIS — F330 Major depressive disorder, recurrent, mild: Secondary | ICD-10-CM

## 2023-01-13 DIAGNOSIS — R600 Localized edema: Secondary | ICD-10-CM

## 2023-01-13 NOTE — Telephone Encounter (Signed)
Pt returned call and stated he was taken off Pepcid.

## 2023-01-13 NOTE — Telephone Encounter (Signed)
Patient called, left VM to return the call. Famotidine that is requested is not on current medication list, Omeprazole is what's on there for treatment of acid reflux. Is he taking Famotidine or Omeprazole?

## 2023-01-13 NOTE — Telephone Encounter (Signed)
Medication Refill - Medication: Rx #: [id number]  citalopram (CELEXA) 40 MG tablet [[id number]]   Rx #: [id number]  buPROPion (WELLBUTRIN XL) 300 MG 24 hr tablet [[id number]]   Rx #: [id number]  furosemide (LASIX) 20 MG tablet [[id number]]   Rx #: [id number]  clopidogrel (PLAVIX) 75 MG tablet [[id number]]   Rx #: [id number]  pravastatin (PRAVACHOL) 80 MG tablet [[id number]]   Rx #: [id number]  omeprazole (PRILOSEC) 20 MG capsule [[id number]]   Rx #: [id number]  gabapentin (NEURONTIN) 300 MG capsule [[id number]]   Rx #: [id number]  nitroGLYCERIN (NITROSTAT) 0.4 MG SL tablet [[id number]]   Rx #: [id number]  losartan (COZAAR) 50 MG tablet [[id number]]   Rx #: [id number]  famotidine (PEPCID) 20 MG tablet [[id number]]  DISCONTINUED   Rx #: [id number]  metoprolol succinate (TOPROL-XL) 50 MG 24 hr tablet [[id number]]   Rx #: [id number]  Semaglutide, 1 MG/DOSE, 4 MG/3ML SOPN [[id number]]    Has the patient contacted their pharmacy? No. (Agent: If no, request that the patient contact the pharmacy for the refill. If patient does not wish to contact the pharmacy document the reason why and proceed with request.) (Agent: If yes, when and what did the pharmacy advise?)  Preferred Pharmacy (with phone number or street name): [place], [place]. [address] [phone]  Has the patient been seen for an appointment in the last year OR does the patient have an upcoming appointment? No.  Agent: Please be advised that RX refills may take up to 3 business days. We ask that you follow-up with your pharmacy.

## 2023-01-14 MED ORDER — NITROGLYCERIN 0.4 MG SL SUBL: SUBLINGUAL_TABLET | SUBLINGUAL | 9 refills | Status: AC

## 2023-01-14 MED ORDER — SEMAGLUTIDE (1 MG/DOSE) 4 MG/3ML ~~LOC~~ SOPN: 1.0000 mg | PEN_INJECTOR | SUBCUTANEOUS | 0 refills | Status: DC

## 2023-01-14 MED ORDER — LOSARTAN POTASSIUM 50 MG PO TABS: 50.0000 mg | ORAL_TABLET | Freq: Every day | ORAL | 1 refills | Status: DC

## 2023-01-14 MED ORDER — PRAVASTATIN SODIUM 80 MG PO TABS: 80.0000 mg | ORAL_TABLET | ORAL | 0 refills | Status: DC

## 2023-01-14 MED ORDER — GABAPENTIN 300 MG PO CAPS: 600.0000 mg | ORAL_CAPSULE | Freq: Every evening | ORAL | 1 refills | Status: DC | PRN

## 2023-01-14 MED ORDER — OMEPRAZOLE 20 MG PO CPDR: 20.0000 mg | DELAYED_RELEASE_CAPSULE | Freq: Two times a day (BID) | ORAL | 1 refills | Status: DC

## 2023-01-14 MED ORDER — CITALOPRAM HYDROBROMIDE 40 MG PO TABS: 40.0000 mg | ORAL_TABLET | Freq: Every day | ORAL | 1 refills | Status: DC

## 2023-01-14 MED ORDER — BUPROPION HCL ER (XL) 300 MG PO TB24: 300.0000 mg | ORAL_TABLET | Freq: Every day | ORAL | 0 refills | Status: DC

## 2023-01-14 MED ORDER — CLOPIDOGREL BISULFATE 75 MG PO TABS: 75.0000 mg | ORAL_TABLET | Freq: Every day | ORAL | 0 refills | Status: DC

## 2023-01-14 MED ORDER — METOPROLOL SUCCINATE ER 50 MG PO TB24: 50.0000 mg | ORAL_TABLET | Freq: Every day | ORAL | 1 refills | Status: DC

## 2023-01-14 NOTE — Telephone Encounter (Signed)
Requested medication (s) are due for refill today: yes  Requested medication (s) are on the active medication list: yes  Last refill:  04/19/22 #90 1 RF  Future visit scheduled: yes  Notes to clinic:  next to SIG: Hold until f/u with your outpt doctor because your BP was normal without it and you have AKI   Requested Prescriptions  Pending Prescriptions Disp Refills   furosemide (LASIX) 20 MG tablet 90 tablet     Sig: Hold until followup with your outpatient doctor because your blood pressure was normal without it and you had acute kidney injury.     Cardiovascular:  Diuretics - Loop Failed - 01/13/2023 11:11 AM      Failed - Mg Level in normal range and within 180 days    Magnesium  Date Value Ref Range Status  10/19/2020 2.2 1.7 - 2.4 mg/dL Final    Comment:    Performed at [facility], [address] [place], [place] [address]         Passed - K in normal range and within 180 days    Potassium  Date Value Ref Range Status  10/21/2022 4.6 3.5 - 5.2 mmol/L Final  07/15/2014 4.4 3.5 - 5.1 mmol/L Final         Passed - Ca in normal range and within 180 days    Calcium  Date Value Ref Range Status  10/21/2022 9.2 8.6 - 10.2 mg/dL Final   Calcium, Total  Date Value Ref Range Status  07/15/2014 8.3 (L) 8.5 - 10.1 mg/dL Final         Passed - Na in normal range and within 180 days    Sodium  Date Value Ref Range Status  10/21/2022 140 134 - 144 mmol/L Final  07/15/2014 144 136 - 145 mmol/L Final         Passed - Cr in normal range and within 180 days    Creatinine  Date Value Ref Range Status  07/15/2014 1.07 0.60 - 1.30 mg/dL Final   Creatinine, Ser  Date Value Ref Range Status  10/21/2022 1.16 0.76 - 1.27 mg/dL Final         Passed - Cl in normal range and within 180 days    Chloride  Date Value Ref Range Status  10/21/2022 105 96 - 106 mmol/L Final  07/15/2014 111 (H) 98 - 107 mmol/L Final         Passed - Last BP in normal range    BP  Readings from Last 1 Encounters:  10/21/22 120/72         Passed - Valid encounter within last 6 months    Recent Outpatient Visits           2 months ago Annual physical exam   [place] Jon Billings, NP   5 months ago Unstable angina [facility])   [place] Jon Billings, NP   7 months ago Coronary artery disease involving native coronary artery of native heart with unstable angina pectoris [facility])   [place], Megan P, DO   9 months ago Congestive heart failure, unspecified HF chronicity, unspecified heart failure type [facility])   [place] Jon Billings, NP   1 year ago Essential hypertension   [place] Jon Billings, NP       Future Appointments  In 1 week Jon Billings, NP [place], [facility]            Signed Prescriptions Disp Refills   buPROPion (WELLBUTRIN XL) 300 MG 24 hr tablet 90 tablet 0    Sig: Take 1 tablet (300 mg total) by mouth daily.     Psychiatry: Antidepressants - bupropion Passed - 01/13/2023 11:11 AM      Passed - Cr in normal range and within 360 days    Creatinine  Date Value Ref Range Status  07/15/2014 1.07 0.60 - 1.30 mg/dL Final   Creatinine, Ser  Date Value Ref Range Status  10/21/2022 1.16 0.76 - 1.27 mg/dL Final         Passed - AST in normal range and within 360 days    AST  Date Value Ref Range Status  10/21/2022 18 0 - 40 IU/L Final         Passed - ALT in normal range and within 360 days    ALT  Date Value Ref Range Status  10/21/2022 14 0 - 44 IU/L Final         Passed - Completed PHQ-2 or PHQ-9 in the last 360 days      Passed - Last BP in normal range    BP Readings from Last 1 Encounters:  10/21/22 120/72         Passed - Valid encounter within last 6 months    Recent Outpatient Visits           2 months ago Annual physical  exam   [place] Jon Billings, NP   5 months ago Unstable angina [facility])   [place] Jon Billings, NP   7 months ago Coronary artery disease involving native coronary artery of native heart with unstable angina pectoris [facility])   [place], Megan P, DO   9 months ago Congestive heart failure, unspecified HF chronicity, unspecified heart failure type [facility])   [place] Jon Billings, NP   1 year ago Essential hypertension   [place] Jon Billings, NP       Future Appointments             In 1 week Jon Billings, NP [place], [facility]             citalopram (CELEXA) 40 MG tablet 90 tablet 1    Sig: Take 1 tablet (40 mg total) by mouth daily.     Psychiatry:  Antidepressants - SSRI Passed - 01/13/2023 11:11 AM      Passed - Completed PHQ-2 or PHQ-9 in the last 360 days      Passed - Valid encounter within last 6 months    Recent Outpatient Visits           2 months ago Annual physical exam   [place] Jon Billings, NP   5 months ago Unstable angina [facility])   [place] Jon Billings, NP   7 months ago Coronary artery disease involving native coronary artery of native heart with unstable angina pectoris [facility])   [place] P, DO   9 months ago Congestive heart failure, unspecified HF chronicity, unspecified heart failure type [facility])   [place], NP   1 year ago Essential hypertension   [place]  [facility] Jon Billings, NP       Future Appointments             In 1 week Jon Billings, NP [place], [facility]             clopidogrel (PLAVIX) 75 MG tablet 90 tablet 0    Sig: Take 1 tablet (75 mg  total) by mouth at bedtime.     Hematology: Antiplatelets - clopidogrel Passed - 01/13/2023 11:11 AM      Passed - HCT in normal range and within 180 days    Hematocrit  Date Value Ref Range Status  10/21/2022 41.1 37.5 - 51.0 % Final         Passed - HGB in normal range and within 180 days    Hemoglobin  Date Value Ref Range Status  10/21/2022 13.6 13.0 - 17.7 g/dL Final         Passed - PLT in normal range and within 180 days    Platelets  Date Value Ref Range Status  10/21/2022 161 150 - 450 x10E3/uL Final         Passed - Cr in normal range and within 360 days    Creatinine  Date Value Ref Range Status  07/15/2014 1.07 0.60 - 1.30 mg/dL Final   Creatinine, Ser  Date Value Ref Range Status  10/21/2022 1.16 0.76 - 1.27 mg/dL Final         Passed - Valid encounter within last 6 months    Recent Outpatient Visits           2 months ago Annual physical exam   [place] Jon Billings, NP   5 months ago Unstable angina [facility])   [place] Jon Billings, NP   7 months ago Coronary artery disease involving native coronary artery of native heart with unstable angina pectoris [facility])   [place], Megan P, DO   9 months ago Congestive heart failure, unspecified HF chronicity, unspecified heart failure type [facility])   [place] Jon Billings, NP   1 year ago Essential hypertension   [place] Jon Billings, NP       Future Appointments             In 1 week Jon Billings, NP [place], [facility]             omeprazole (PRILOSEC) 20 MG capsule 180 capsule 1    Sig: Take 1 capsule (20 mg total) by mouth 2 (two) times daily before a meal.     Gastroenterology: Proton Pump Inhibitors Passed - 01/13/2023 11:11 AM      Passed - Valid encounter within last 12 months    Recent Outpatient Visits            2 months ago Annual physical exam   [place] Jon Billings, NP   5 months ago Unstable angina [facility])   [place] Jon Billings, NP   7 months ago Coronary artery disease involving native coronary artery of native heart with unstable angina pectoris [facility])   [place], Megan P, DO   9 months ago Congestive heart failure, unspecified HF chronicity, unspecified heart failure type [facility])   [place], NP   1 year ago Essential hypertension   [place], NP       Future Appointments             In 1 week Jon Billings, NP [place], [facility]             pravastatin (PRAVACHOL) 80 MG tablet 24 tablet 0    Sig: Take 1 tablet (80 mg total) by mouth once a week. Take 1 tab once weekly     Cardiovascular:  Antilipid - Statins Failed - 01/13/2023 11:11 AM      Failed - Lipid Panel in normal range within the last 12 months    Cholesterol, Total  Date Value Ref Range Status  10/21/2022 196 100 - 199 mg/dL Final   LDL Chol Calc (NIH)  Date Value Ref Range Status  10/21/2022 130 (H) 0 - 99 mg/dL Final   HDL  Date Value Ref Range Status  10/21/2022 31 (L) >39 mg/dL Final   Triglycerides  Date Value Ref Range Status  10/21/2022 194 (H) 0 - 149 mg/dL Final         Passed - Patient is not pregnant      Passed - Valid encounter within last 12 months    Recent Outpatient Visits           2 months ago Annual physical exam   [place] Jon Billings, NP   5 months ago Unstable angina [facility])   [place] Jon Billings, NP   7 months ago Coronary artery disease involving native coronary artery of native heart with unstable angina pectoris [facility])   [place], Megan P, DO   9  months ago Congestive heart failure, unspecified HF chronicity, unspecified heart failure type [facility])   [place] Jon Billings, NP   1 year ago Essential hypertension   [place] Jon Billings, NP       Future Appointments             In 1 week Jon Billings, NP [place], [facility]             losartan (COZAAR) 50 MG tablet 90 tablet 1    Sig: Take 1 tablet (50 mg total) by mouth daily.     Cardiovascular:  Angiotensin Receptor Blockers Passed - 01/13/2023 11:11 AM      Passed - Cr in normal range and within 180 days    Creatinine  Date Value Ref Range Status  07/15/2014 1.07 0.60 - 1.30 mg/dL Final   Creatinine, Ser  Date Value Ref Range Status  10/21/2022 1.16 0.76 - 1.27 mg/dL Final         Passed - K in normal range and within 180 days    Potassium  Date Value Ref Range Status  10/21/2022 4.6 3.5 - 5.2 mmol/L Final  07/15/2014 4.4 3.5 - 5.1 mmol/L Final         Passed - Patient is not pregnant      Passed - Last BP in normal range    BP Readings from Last 1 Encounters:  10/21/22 120/72         Passed - Valid encounter within last 6 months    Recent Outpatient Visits           2 months ago Annual physical exam   [place], NP   5 months ago  Unstable angina [facility])   [place] Jon Billings, NP   7 months ago Coronary artery disease involving native coronary artery of native heart with unstable angina pectoris [facility])   [place], Megan P, DO   9 months ago Congestive heart failure, unspecified HF chronicity, unspecified heart failure type [facility])   [place] Jon Billings, NP   1 year ago Essential hypertension   [place] Jon Billings, NP       Future Appointments             In 1 week Jon Billings, NP [place], [facility]             nitroGLYCERIN (NITROSTAT) 0.4 MG SL tablet 25 tablet 9    Sig: Place 1 tablet (0.4 mg total) under the tongue every 5 (five) minutes as needed for Chest pain May take up to 3 doses.     Cardiovascular:  Nitrates Passed - 01/13/2023 11:11 AM      Passed - Last BP in normal range    BP Readings from Last 1 Encounters:  10/21/22 120/72         Passed - Last Heart Rate in normal range    Pulse Readings from Last 1 Encounters:  10/21/22 62         Passed - Valid encounter within last 12 months    Recent Outpatient Visits           2 months ago Annual physical exam   [place] Jon Billings, NP   5 months ago Unstable angina [facility])   [place] Jon Billings, NP   7 months ago Coronary artery disease involving native coronary artery of native heart with unstable angina pectoris [facility])   [place] P, DO   9 months ago Congestive heart failure, unspecified HF chronicity, unspecified heart failure type [facility])   [place] Jon Billings, NP   1 year ago Essential hypertension   [place], NP       Future Appointments             In 1 week Jon Billings, NP [place], [facility]             gabapentin (NEURONTIN) 300 MG capsule 180 capsule 1    Sig: Take 2 capsules (600 mg total) by mouth at bedtime as needed.     Neurology: Anticonvulsants - gabapentin Passed - 01/13/2023 11:11 AM      Passed - Cr in normal range and within 360 days    Creatinine  Date Value Ref Range Status  07/15/2014 1.07 0.60 - 1.30 mg/dL Final   Creatinine, Ser  Date Value Ref Range Status  10/21/2022 1.16 0.76 - 1.27 mg/dL Final         Passed - Completed PHQ-2 or PHQ-9 in the last 360 days      Passed - Valid encounter within last  12 months    Recent Outpatient Visits           2 months ago Annual physical exam   [place], NP   5 months ago Unstable angina [facility])   [place], NP   7 months ago Coronary artery disease involving native coronary artery  of native heart with unstable angina pectoris [facility])   [place], Megan P, DO   9 months ago Congestive heart failure, unspecified HF chronicity, unspecified heart failure type [facility])   [place] Jon Billings, NP   1 year ago Essential hypertension   [place] Jon Billings, NP       Future Appointments             In 1 week Jon Billings, NP [place], [facility]             Semaglutide, 1 MG/DOSE, 4 MG/3ML SOPN 9 mL 0    Sig: Inject 1 mg as directed once a week.     Endocrinology:  Diabetes - GLP-1 Receptor Agonists - semaglutide Failed - 01/13/2023 11:11 AM      Failed - HBA1C in normal range and within 180 days    Hemoglobin A1C  Date Value Ref Range Status  06/20/2018 6.8  Final   HB A1C (BAYER DCA - WAIVED)  Date Value Ref Range Status  01/26/2021 6.5 <7.0 % Final    Comment:                                          Diabetic Adult            <7.0                                       Healthy Adult        4.3 - 5.7                                                           (DCCT/NGSP) American Diabetes Association's Summary of Glycemic Recommendations for Adults with Diabetes: Hemoglobin A1c <7.0%. More stringent glycemic goals (A1c <6.0%) may further reduce complications at the cost of increased risk of hypoglycemia.    Hgb A1c MFr Bld  Date Value Ref Range Status  10/21/2022 5.9 (H) 4.8 - 5.6 % Final    Comment:             Prediabetes: 5.7 - 6.4          Diabetes: >6.4          Glycemic control for adults with diabetes: <7.0           Passed - Cr in normal range and within 360 days    Creatinine  Date Value Ref Range Status  07/15/2014 1.07 0.60 - 1.30 mg/dL Final   Creatinine, Ser  Date Value Ref Range Status  10/21/2022 1.16 0.76 - 1.27 mg/dL Final         Passed - Valid encounter within last 6 months    Recent Outpatient Visits           2 months ago Annual physical exam   [place], Karen, NP   5 months ago Unstable angina [facility])   [place], Karen, NP   7 months ago Coronary artery disease involving native coronary artery of  native heart with unstable angina pectoris [facility])   [place], Megan P, DO   9 months ago Congestive heart failure, unspecified HF chronicity, unspecified heart failure type [facility])   [place] Jon Billings, NP   1 year ago Essential hypertension   [place], NP       Future Appointments             In 1 week Jon Billings, NP [place], [facility]             metoprolol succinate (TOPROL-XL) 50 MG 24 hr tablet 90 tablet 1    Sig: Take 1 tablet (50 mg total) by mouth daily.     Cardiovascular:  Beta Blockers Passed - 01/13/2023 11:11 AM      Passed - Last BP in normal range    BP Readings from Last 1 Encounters:  10/21/22 120/72         Passed - Last Heart Rate in normal range    Pulse Readings from Last 1 Encounters:  10/21/22 62         Passed - Valid encounter within last 6 months    Recent Outpatient Visits           2 months ago Annual physical exam   [place] Jon Billings, NP   5 months ago Unstable angina [facility])   [place] Jon Billings, NP   7 months ago Coronary artery disease involving native coronary artery of native heart with unstable angina pectoris [facility])   [place] P, DO   9 months ago Congestive heart failure, unspecified HF chronicity, unspecified heart failure type [facility])   [place] Jon Billings, NP   1 year ago Essential hypertension   [place] Jon Billings, NP       Future Appointments             In 1 week Jon Billings, NP [place], [facility]

## 2023-01-14 NOTE — Telephone Encounter (Signed)
Change of pharmacy Requested Prescriptions  Pending Prescriptions Disp Refills   buPROPion (WELLBUTRIN XL) 300 MG 24 hr tablet 90 tablet 0    Sig: Take 1 tablet (300 mg total) by mouth daily.     Psychiatry: Antidepressants - bupropion Passed - 01/13/2023 11:11 AM      Passed - Cr in normal range and within 360 days    Creatinine  Date Value Ref Range Status  07/15/2014 1.07 0.60 - 1.30 mg/dL Final   Creatinine, Ser  Date Value Ref Range Status  10/21/2022 1.16 0.76 - 1.27 mg/dL Final         Passed - AST in normal range and within 360 days    AST  Date Value Ref Range Status  10/21/2022 18 0 - 40 IU/L Final         Passed - ALT in normal range and within 360 days    ALT  Date Value Ref Range Status  10/21/2022 14 0 - 44 IU/L Final         Passed - Completed PHQ-2 or PHQ-9 in the last 360 days      Passed - Last BP in normal range    BP Readings from Last 1 Encounters:  10/21/22 120/72         Passed - Valid encounter within last 6 months    Recent Outpatient Visits           2 months ago Annual physical exam   [place] Jon Billings, NP   5 months ago Unstable angina [facility])   [place] Jon Billings, NP   7 months ago Coronary artery disease involving native coronary artery of native heart with unstable angina pectoris [facility])   [place], Megan P, DO   9 months ago Congestive heart failure, unspecified HF chronicity, unspecified heart failure type [facility])   [place] Jon Billings, NP   1 year ago Essential hypertension   [place] Jon Billings, NP       Future Appointments             In 1 week Jon Billings, NP [place]             citalopram (CELEXA) 40 MG tablet 90 tablet 1    Sig: Take 1 tablet (40 mg total) by mouth daily.     Psychiatry:   Antidepressants - SSRI Passed - 01/13/2023 11:11 AM      Passed - Completed PHQ-2 or PHQ-9 in the last 360 days      Passed - Valid encounter within last 6 months    Recent Outpatient Visits           2 months ago Annual physical exam   [place] Jon Billings, NP   5 months ago Unstable angina [facility])   [place] Jon Billings, NP   7 months ago Coronary artery disease involving native coronary artery of native heart with unstable angina pectoris [facility])   [place] P, DO   9 months ago Congestive heart failure, unspecified HF chronicity, unspecified heart failure type [facility])   [place] Jon Billings, NP   1 year ago Essential hypertension   [place] Jon Billings, NP       Future Appointments  In 1 week Jon Billings, NP [place], [facility]             furosemide (LASIX) 20 MG tablet 90 tablet     Sig: Hold until followup with your outpatient doctor because your blood pressure was normal without it and you had acute kidney injury.     Cardiovascular:  Diuretics - Loop Failed - 01/13/2023 11:11 AM      Failed - Mg Level in normal range and within 180 days    Magnesium  Date Value Ref Range Status  10/19/2020 2.2 1.7 - 2.4 mg/dL Final    Comment:    Performed at [facility], [address] [place], [place] [address]         Passed - K in normal range and within 180 days    Potassium  Date Value Ref Range Status  10/21/2022 4.6 3.5 - 5.2 mmol/L Final  07/15/2014 4.4 3.5 - 5.1 mmol/L Final         Passed - Ca in normal range and within 180 days    Calcium  Date Value Ref Range Status  10/21/2022 9.2 8.6 - 10.2 mg/dL Final   Calcium, Total  Date Value Ref Range Status  07/15/2014 8.3 (L) 8.5 - 10.1 mg/dL Final         Passed - Na in normal range and  within 180 days    Sodium  Date Value Ref Range Status  10/21/2022 140 134 - 144 mmol/L Final  07/15/2014 144 136 - 145 mmol/L Final         Passed - Cr in normal range and within 180 days    Creatinine  Date Value Ref Range Status  07/15/2014 1.07 0.60 - 1.30 mg/dL Final   Creatinine, Ser  Date Value Ref Range Status  10/21/2022 1.16 0.76 - 1.27 mg/dL Final         Passed - Cl in normal range and within 180 days    Chloride  Date Value Ref Range Status  10/21/2022 105 96 - 106 mmol/L Final  07/15/2014 111 (H) 98 - 107 mmol/L Final         Passed - Last BP in normal range    BP Readings from Last 1 Encounters:  10/21/22 120/72         Passed - Valid encounter within last 6 months    Recent Outpatient Visits           2 months ago Annual physical exam   [place] Jon Billings, NP   5 months ago Unstable angina [facility])   [place] Jon Billings, NP   7 months ago Coronary artery disease involving native coronary artery of native heart with unstable angina pectoris [facility])   [place], Megan P, DO   9 months ago Congestive heart failure, unspecified HF chronicity, unspecified heart failure type [facility])   [place] Jon Billings, NP   1 year ago Essential hypertension   [place] Jon Billings, NP       Future Appointments             In 1 week Jon Billings, NP [place], [facility]             clopidogrel (PLAVIX) 75 MG tablet 90 tablet 0    Sig: Take 1 tablet (75 mg total) by mouth at bedtime.  Hematology: Antiplatelets - clopidogrel Passed - 01/13/2023 11:11 AM      Passed - HCT in normal range and within 180 days    Hematocrit  Date Value Ref Range Status  10/21/2022 41.1 37.5 - 51.0 % Final         Passed - HGB in normal range and within 180 days    Hemoglobin  Date  Value Ref Range Status  10/21/2022 13.6 13.0 - 17.7 g/dL Final         Passed - PLT in normal range and within 180 days    Platelets  Date Value Ref Range Status  10/21/2022 161 150 - 450 x10E3/uL Final         Passed - Cr in normal range and within 360 days    Creatinine  Date Value Ref Range Status  07/15/2014 1.07 0.60 - 1.30 mg/dL Final   Creatinine, Ser  Date Value Ref Range Status  10/21/2022 1.16 0.76 - 1.27 mg/dL Final         Passed - Valid encounter within last 6 months    Recent Outpatient Visits           2 months ago Annual physical exam   [place] Jon Billings, NP   5 months ago Unstable angina [facility])   [place] Jon Billings, NP   7 months ago Coronary artery disease involving native coronary artery of native heart with unstable angina pectoris [facility])   [place], Megan P, DO   9 months ago Congestive heart failure, unspecified HF chronicity, unspecified heart failure type [facility])   [place] Jon Billings, NP   1 year ago Essential hypertension   [place] Jon Billings, NP       Future Appointments             In 1 week Jon Billings, NP [place], [facility]             omeprazole (PRILOSEC) 20 MG capsule 180 capsule 1    Sig: Take 1 capsule (20 mg total) by mouth 2 (two) times daily before a meal.     Gastroenterology: Proton Pump Inhibitors Passed - 01/13/2023 11:11 AM      Passed - Valid encounter within last 12 months    Recent Outpatient Visits           2 months ago Annual physical exam   [place] Jon Billings, NP   5 months ago Unstable angina [facility])   [place] Jon Billings, NP   7 months ago Coronary artery disease involving native coronary artery of native heart with unstable angina  pectoris [facility])   [place], Megan P, DO   9 months ago Congestive heart failure, unspecified HF chronicity, unspecified heart failure type [facility])   [place] Jon Billings, NP   1 year ago Essential hypertension   [place], NP       Future Appointments             In 1 week Jon Billings, NP [place], [facility]             pravastatin (PRAVACHOL) 80 MG tablet 24 tablet 0    Sig: Take 1 tablet (80 mg total) by mouth once a week. Take 1  tab once weekly     Cardiovascular:  Antilipid - Statins Failed - 01/13/2023 11:11 AM      Failed - Lipid Panel in normal range within the last 12 months    Cholesterol, Total  Date Value Ref Range Status  10/21/2022 196 100 - 199 mg/dL Final   LDL Chol Calc (NIH)  Date Value Ref Range Status  10/21/2022 130 (H) 0 - 99 mg/dL Final   HDL  Date Value Ref Range Status  10/21/2022 31 (L) >39 mg/dL Final   Triglycerides  Date Value Ref Range Status  10/21/2022 194 (H) 0 - 149 mg/dL Final         Passed - Patient is not pregnant      Passed - Valid encounter within last 12 months    Recent Outpatient Visits           2 months ago Annual physical exam   [place] Jon Billings, NP   5 months ago Unstable angina [facility])   [place] Jon Billings, NP   7 months ago Coronary artery disease involving native coronary artery of native heart with unstable angina pectoris [facility])   [place], Megan P, DO   9 months ago Congestive heart failure, unspecified HF chronicity, unspecified heart failure type [facility])   [place] Jon Billings, NP   1 year ago Essential hypertension   [place] Jon Billings, NP       Future Appointments             In 1 week  Jon Billings, NP [place], [facility]             losartan (COZAAR) 50 MG tablet 90 tablet 1    Sig: Take 1 tablet (50 mg total) by mouth daily.     Cardiovascular:  Angiotensin Receptor Blockers Passed - 01/13/2023 11:11 AM      Passed - Cr in normal range and within 180 days    Creatinine  Date Value Ref Range Status  07/15/2014 1.07 0.60 - 1.30 mg/dL Final   Creatinine, Ser  Date Value Ref Range Status  10/21/2022 1.16 0.76 - 1.27 mg/dL Final         Passed - K in normal range and within 180 days    Potassium  Date Value Ref Range Status  10/21/2022 4.6 3.5 - 5.2 mmol/L Final  07/15/2014 4.4 3.5 - 5.1 mmol/L Final         Passed - Patient is not pregnant      Passed - Last BP in normal range    BP Readings from Last 1 Encounters:  10/21/22 120/72         Passed - Valid encounter within last 6 months    Recent Outpatient Visits           2 months ago Annual physical exam   [place] Jon Billings, NP   5 months ago Unstable angina [facility])   [place] Jon Billings, NP   7 months ago Coronary artery disease involving native coronary artery of native heart with unstable angina pectoris [facility])   [place], Megan P, DO   9 months ago Congestive heart failure, unspecified HF chronicity, unspecified heart failure type [facility])   [place] Jon Billings, NP   1 year ago  Essential hypertension   [place], NP       Future Appointments             In 1 week Jon Billings, NP [place], [facility]             nitroGLYCERIN (NITROSTAT) 0.4 MG SL tablet 25 tablet 9    Sig: Place 1 tablet (0.4 mg total) under the tongue every 5 (five) minutes as needed for Chest pain May take up to 3 doses.     Cardiovascular:  Nitrates Passed - 01/13/2023 11:11 AM       Passed - Last BP in normal range    BP Readings from Last 1 Encounters:  10/21/22 120/72         Passed - Last Heart Rate in normal range    Pulse Readings from Last 1 Encounters:  10/21/22 62         Passed - Valid encounter within last 12 months    Recent Outpatient Visits           2 months ago Annual physical exam   [place] Jon Billings, NP   5 months ago Unstable angina [facility])   [place] Jon Billings, NP   7 months ago Coronary artery disease involving native coronary artery of native heart with unstable angina pectoris [facility])   [place] P, DO   9 months ago Congestive heart failure, unspecified HF chronicity, unspecified heart failure type [facility])   [place] Jon Billings, NP   1 year ago Essential hypertension   [place], NP       Future Appointments             In 1 week Jon Billings, NP [place], [facility]             gabapentin (NEURONTIN) 300 MG capsule 180 capsule 1    Sig: Take 2 capsules (600 mg total) by mouth at bedtime as needed.     Neurology: Anticonvulsants - gabapentin Passed - 01/13/2023 11:11 AM      Passed - Cr in normal range and within 360 days    Creatinine  Date Value Ref Range Status  07/15/2014 1.07 0.60 - 1.30 mg/dL Final   Creatinine, Ser  Date Value Ref Range Status  10/21/2022 1.16 0.76 - 1.27 mg/dL Final         Passed - Completed PHQ-2 or PHQ-9 in the last 360 days      Passed - Valid encounter within last 12 months    Recent Outpatient Visits           2 months ago Annual physical exam   [place] Jon Billings, NP   5 months ago Unstable angina [facility])   [place] Jon Billings, NP   7 months ago Coronary artery disease involving native coronary  artery of native heart with unstable angina pectoris [facility])   [place], Megan P, DO   9 months ago Congestive heart failure, unspecified HF chronicity, unspecified heart failure type [facility])   [place] Jon Billings, NP   1 year ago Essential hypertension   [place] Jon Billings, NP       Future Appointments  In 1 week Jon Billings, NP [place], [facility]             Semaglutide, 1 MG/DOSE, 4 MG/3ML SOPN 9 mL 0    Sig: Inject 1 mg as directed once a week.     Endocrinology:  Diabetes - GLP-1 Receptor Agonists - semaglutide Failed - 01/13/2023 11:11 AM      Failed - HBA1C in normal range and within 180 days    Hemoglobin A1C  Date Value Ref Range Status  06/20/2018 6.8  Final   HB A1C (BAYER DCA - WAIVED)  Date Value Ref Range Status  01/26/2021 6.5 <7.0 % Final    Comment:                                          Diabetic Adult            <7.0                                       Healthy Adult        4.3 - 5.7                                                           (DCCT/NGSP) American Diabetes Association's Summary of Glycemic Recommendations for Adults with Diabetes: Hemoglobin A1c <7.0%. More stringent glycemic goals (A1c <6.0%) may further reduce complications at the cost of increased risk of hypoglycemia.    Hgb A1c MFr Bld  Date Value Ref Range Status  10/21/2022 5.9 (H) 4.8 - 5.6 % Final    Comment:             Prediabetes: 5.7 - 6.4          Diabetes: >6.4          Glycemic control for adults with diabetes: <7.0          Passed - Cr in normal range and within 360 days    Creatinine  Date Value Ref Range Status  07/15/2014 1.07 0.60 - 1.30 mg/dL Final   Creatinine, Ser  Date Value Ref Range Status  10/21/2022 1.16 0.76 - 1.27 mg/dL Final         Passed - Valid encounter within last 6 months    Recent Outpatient  Visits           2 months ago Annual physical exam   [place] Jon Billings, NP   5 months ago Unstable angina [facility])   [place] Jon Billings, NP   7 months ago Coronary artery disease involving native coronary artery of native heart with unstable angina pectoris [facility])   [place], Megan P, DO   9 months ago Congestive heart failure, unspecified HF chronicity, unspecified heart failure type [facility])   [place] Jon Billings, NP   1 year ago Essential hypertension   [place] Jon Billings, NP       Future Appointments             In  1 week Jon Billings, NP [place], [facility]             metoprolol succinate (TOPROL-XL) 50 MG 24 hr tablet 90 tablet 1    Sig: Take 1 tablet (50 mg total) by mouth daily.     Cardiovascular:  Beta Blockers Passed - 01/13/2023 11:11 AM      Passed - Last BP in normal range    BP Readings from Last 1 Encounters:  10/21/22 120/72         Passed - Last Heart Rate in normal range    Pulse Readings from Last 1 Encounters:  10/21/22 62         Passed - Valid encounter within last 6 months    Recent Outpatient Visits           2 months ago Annual physical exam   [place] Jon Billings, NP   5 months ago Unstable angina [facility])   [place] Jon Billings, NP   7 months ago Coronary artery disease involving native coronary artery of native heart with unstable angina pectoris [facility])   [place] P, DO   9 months ago Congestive heart failure, unspecified HF chronicity, unspecified heart failure type [facility])   [place] Jon Billings, NP   1 year ago Essential hypertension   [place] Jon Billings, NP        Future Appointments             In 1 week Jon Billings, NP [place], [facility]

## 2023-01-20 ENCOUNTER — Other Ambulatory Visit: Payer: Self-pay | Admitting: Nurse Practitioner

## 2023-01-20 DIAGNOSIS — I25110 Atherosclerotic heart disease of native coronary artery with unstable angina pectoris: Secondary | ICD-10-CM

## 2023-01-21 ENCOUNTER — Ambulatory Visit: Payer: Medicare Other | Admitting: Nurse Practitioner

## 2023-01-21 NOTE — Progress Notes (Deleted)
There were no vitals taken for this visit.   Subjective:    Patient ID: Steven Vang, male    DOB: 08-Aug-1956, 67 y.o.   MRN: [id number]  HPI: Steven Vang is a 67 y.o. male  No chief complaint on file.  HYPERLIPIDEMIA-  Does not have an appt with Cardiology. tolerating the pravastatin 1x a week Hyperlipidemia status: good compliance Satisfied with current treatment?  yes Side effects:  not on this dose Medication compliance: good compliance Aspirin:  no The 10-year ASCVD risk score (Arnett DK, et al., 2019) is: 29.6%   Values used to calculate the score:     Age: 70 years     Sex: Male     Is Non-Hispanic African American: No     Diabetic: Yes     Tobacco smoker: No     Systolic Blood Pressure: 123456 mmHg     Is BP treated: Yes     HDL Cholesterol: 31 mg/dL     Total Cholesterol: 196 mg/dL Chest pain:  no Coronary artery disease:  yes  DEPRESSION Patient stats he feels like the increased dose of Wellbutrin is going alright.  He feels like he does have some days where he has no motivation.   Mood status: better Satisfied with current treatment?: no Symptom severity: mild  Duration of current treatment :  about a month Side effects: no Medication compliance: good compliance Psychotherapy/counseling: no  Previous psychiatric medications: celexa, wellbutrin Depressed mood: yes Anxious mood: yes Anhedonia: no Significant weight loss or gain: yes Insomnia: no  Fatigue: yes Feelings of worthlessness or guilt: no Impaired concentration/indecisiveness: no Suicidal ideations: no Hopelessness: no Crying spells: no    10/21/2022    9:39 AM 07/19/2022   11:07 AM 05/31/2022   11:42 AM 04/19/2022    8:15 AM 11/16/2021   11:31 AM  Depression screen PHQ 2/9  Decreased Interest 0 '1 1 2 '$ 0  Down, Depressed, Hopeless 0 1 0 2 0  PHQ - 2 Score 0 '2 1 4 '$ 0  Altered sleeping 0 2 0 2 0  Tired, decreased energy '1 2 1 3 1  '$ Change in appetite '1 1 1 2 '$ 0  Feeling bad or failure  about yourself  0 1 0 1 0  Trouble concentrating 1 0 0 2 0  Moving slowly or fidgety/restless 1 0 2 0 1  Suicidal thoughts 0 0 0 0 0  PHQ-9 Score '4 8 5 14 2  '$ Difficult doing work/chores Somewhat difficult Somewhat difficult Not difficult at all Not difficult at all     Patient states he is having a lot of pain on his L heel.  Pain has been going on for a couple of weeks.  Not sure if he did something to it.  It is worse after he sits for awhile and then improves.   Relevant past medical, surgical, family and social history reviewed and updated as indicated. Interim medical history since our last visit reviewed. Allergies and medications reviewed and updated.  Review of Systems  Constitutional: Negative.   Eyes:  Negative for visual disturbance.  Respiratory: Negative.  Negative for chest tightness and shortness of breath.   Cardiovascular: Negative.  Negative for chest pain, palpitations and leg swelling.  Gastrointestinal: Negative.   Endocrine: Negative for polydipsia and polyuria.  Musculoskeletal:        Left foot pain  Neurological: Negative.  Negative for dizziness, light-headedness, numbness and headaches.  Psychiatric/Behavioral:  Positive for dysphoric mood. Negative  for agitation, behavioral problems, confusion, decreased concentration, hallucinations, self-injury, sleep disturbance and suicidal ideas. The patient is not nervous/anxious and is not hyperactive.     Per HPI unless specifically indicated above     Objective:    There were no vitals taken for this visit.  Wt Readings from Last 3 Encounters:  10/21/22 252 lb 14.4 oz (114.7 kg)  07/19/22 262 lb 11.2 oz (119.2 kg)  05/31/22 262 lb (118.8 kg)    Physical Exam Vitals and nursing note reviewed.  Constitutional:      General: He is not in acute distress.    Appearance: Normal appearance. He is not ill-appearing, toxic-appearing or diaphoretic.  HENT:     Head: Normocephalic and atraumatic.     Right Ear:  External ear normal.     Left Ear: External ear normal.     Nose: Nose normal. No congestion or rhinorrhea.     Mouth/Throat:     Mouth: Mucous membranes are moist.     Pharynx: Oropharynx is clear.  Eyes:     General: No scleral icterus.       Right eye: No discharge.        Left eye: No discharge.     Extraocular Movements: Extraocular movements intact.     Conjunctiva/sclera: Conjunctivae normal.     Pupils: Pupils are equal, round, and reactive to light.  Cardiovascular:     Rate and Rhythm: Normal rate and regular rhythm.     Heart sounds: No murmur heard. Pulmonary:     Effort: Pulmonary effort is normal. No respiratory distress.     Breath sounds: Normal breath sounds. No wheezing, rhonchi or rales.     Comments: Speaking in full sentences Abdominal:     General: Abdomen is flat. Bowel sounds are normal.  Musculoskeletal:        General: Normal range of motion.     Cervical back: Normal range of motion and neck supple.     Left foot: Tenderness present.     Comments: Heal and arch pain with palpation  Skin:    General: Skin is warm and dry.     Capillary Refill: Capillary refill takes less than 2 seconds.     Coloration: Skin is not jaundiced or pale.     Findings: No bruising, erythema, lesion or rash.  Neurological:     General: No focal deficit present.     Mental Status: He is alert and oriented to person, place, and time. Mental status is at baseline.  Psychiatric:        Mood and Affect: Mood normal.        Behavior: Behavior normal.        Thought Content: Thought content normal.        Judgment: Judgment normal.     Results for orders placed or performed in visit on 10/21/22  TSH  Result Value Ref Range   TSH 2.390 0.450 - 4.500 uIU/mL  PSA  Result Value Ref Range   Prostate Specific Ag, Serum 2.3 0.0 - 4.0 ng/mL  Lipid panel  Result Value Ref Range   Cholesterol, Total 196 100 - 199 mg/dL   Triglycerides 194 (H) 0 - 149 mg/dL   HDL 31 (L) >39  mg/dL   VLDL Cholesterol Cal 35 5 - 40 mg/dL   LDL Chol Calc (NIH) 130 (H) 0 - 99 mg/dL   Chol/HDL Ratio 6.3 (H) 0.0 - 5.0 ratio  CBC with Differential/Platelet  Result Value Ref  Range   WBC 7.0 3.4 - 10.8 x10E3/uL   RBC 5.02 4.14 - 5.80 x10E6/uL   Hemoglobin 13.6 13.0 - 17.7 g/dL   Hematocrit 41.1 37.5 - 51.0 %   MCV 82 79 - 97 fL   MCH 27.1 26.6 - 33.0 pg   MCHC 33.1 31.5 - 35.7 g/dL   RDW 13.8 11.6 - 15.4 %   Platelets 161 150 - 450 x10E3/uL   Neutrophils 66 Not Estab. %   Lymphs 21 Not Estab. %   Monocytes 6 Not Estab. %   Eos 6 Not Estab. %   Basos 1 Not Estab. %   Neutrophils Absolute 4.6 1.4 - 7.0 x10E3/uL   Lymphocytes Absolute 1.5 0.7 - 3.1 x10E3/uL   Monocytes Absolute 0.4 0.1 - 0.9 x10E3/uL   EOS (ABSOLUTE) 0.4 0.0 - 0.4 x10E3/uL   Basophils Absolute 0.1 0.0 - 0.2 x10E3/uL   Immature Granulocytes 0 Not Estab. %   Immature Grans (Abs) 0.0 0.0 - 0.1 x10E3/uL  Comprehensive metabolic panel  Result Value Ref Range   Glucose 98 70 - 99 mg/dL   BUN 16 8 - 27 mg/dL   Creatinine, Ser 1.16 0.76 - 1.27 mg/dL   eGFR 70 >59 mL/min/1.73   BUN/Creatinine Ratio 14 10 - 24   Sodium 140 134 - 144 mmol/L   Potassium 4.6 3.5 - 5.2 mmol/L   Chloride 105 96 - 106 mmol/L   CO2 22 20 - 29 mmol/L   Calcium 9.2 8.6 - 10.2 mg/dL   Total Protein 6.2 6.0 - 8.5 g/dL   Albumin 4.1 3.9 - 4.9 g/dL   Globulin, Total 2.1 1.5 - 4.5 g/dL   Albumin/Globulin Ratio 2.0 1.2 - 2.2   Bilirubin Total 0.2 0.0 - 1.2 mg/dL   Alkaline Phosphatase 76 44 - 121 IU/L   AST 18 0 - 40 IU/L   ALT 14 0 - 44 IU/L  Urinalysis, Routine w reflex microscopic  Result Value Ref Range   Specific Gravity, UA 1.010 1.005 - 1.030   pH, UA 5.5 5.0 - 7.5   Color, UA Yellow Yellow   Appearance Ur Clear Clear   Leukocytes,UA Negative Negative   Protein,UA Negative Negative/Trace   Glucose, UA Negative Negative   Ketones, UA Negative Negative   RBC, UA Negative Negative   Bilirubin, UA Negative Negative    Urobilinogen, Ur 0.2 0.2 - 1.0 mg/dL   Nitrite, UA Negative Negative   Microscopic Examination Comment   HgB A1c  Result Value Ref Range   Hgb A1c MFr Bld 5.9 (H) 4.8 - 5.6 %   Est. average glucose Bld gHb Est-mCnc 123 mg/dL  Microalbumin, Urine Waived  Result Value Ref Range   Microalb, Ur Waived 10 0 - 19 mg/L   Creatinine, Urine Waived 50 10 - 300 mg/dL   Microalb/Creat Ratio <30 <30 mg/g      Assessment & Plan:   Problem List Items Addressed This Visit   None     Follow up plan: No follow-ups on file.

## 2023-01-22 ENCOUNTER — Telehealth: Payer: Self-pay

## 2023-01-22 ENCOUNTER — Encounter: Payer: Self-pay | Admitting: Nurse Practitioner

## 2023-01-22 ENCOUNTER — Ambulatory Visit (INDEPENDENT_AMBULATORY_CARE_PROVIDER_SITE_OTHER): Payer: Medicare Other | Admitting: Nurse Practitioner

## 2023-01-22 VITALS — BP 120/72 | HR 65 | Temp 97.6°F | Wt 253.8 lb

## 2023-01-22 DIAGNOSIS — E785 Hyperlipidemia, unspecified: Secondary | ICD-10-CM | POA: Diagnosis not present

## 2023-01-22 DIAGNOSIS — E1169 Type 2 diabetes mellitus with other specified complication: Secondary | ICD-10-CM

## 2023-01-22 DIAGNOSIS — E1140 Type 2 diabetes mellitus with diabetic neuropathy, unspecified: Secondary | ICD-10-CM

## 2023-01-22 DIAGNOSIS — I700 Atherosclerosis of aorta: Secondary | ICD-10-CM

## 2023-01-22 DIAGNOSIS — I10 Essential (primary) hypertension: Secondary | ICD-10-CM

## 2023-01-22 DIAGNOSIS — F330 Major depressive disorder, recurrent, mild: Secondary | ICD-10-CM

## 2023-01-22 DIAGNOSIS — I509 Heart failure, unspecified: Secondary | ICD-10-CM | POA: Diagnosis not present

## 2023-01-22 DIAGNOSIS — E669 Obesity, unspecified: Secondary | ICD-10-CM

## 2023-01-22 MED ORDER — ONETOUCH ULTRA VI STRP: 1.0000 | ORAL_STRIP | Freq: Every day | 3 refills | Status: DC

## 2023-01-22 MED ORDER — ONETOUCH ULTRASOFT LANCETS MISC: 12 refills | Status: AC

## 2023-01-22 MED ORDER — ONETOUCH ULTRA 2 W/DEVICE KIT: 1.0000 | PACK | Freq: Every day | 0 refills | Status: AC

## 2023-01-22 NOTE — Assessment & Plan Note (Signed)
Chronic.  Controlled at 6.2.  Sugars are running 117.  Has been out of Ozempic for 2.5 weeks.  Needs PA which was done while patient was in the office and approved.  Continue with current medication regimen.  Labs ordered today.  Return to clinic in 3 months for reevaluation.  Call sooner if concerns arise.

## 2023-01-22 NOTE — Assessment & Plan Note (Signed)
Recommended eating smaller high protein, low fat meals more frequently and exercising 30 mins a day 5 times a week with a goal of 10-15lb weight loss in the next 3 months.  

## 2023-01-22 NOTE — Assessment & Plan Note (Signed)
Chronic.  Controlled.  Continue with current medication regimen of Pravastatin weekly.  Labs ordered today.  Return to clinic in 3 months for reevaluation.  Call sooner if concerns arise.

## 2023-01-22 NOTE — Progress Notes (Signed)
BP 120/72   Pulse 65   Temp 97.6 F (36.4 C) (Oral)   Wt 253 lb 12.8 oz (115.1 kg)   SpO2 95%   BMI 36.42 kg/m    Subjective:    Patient ID: Steven Vang, male    DOB: 02-23-1956, 67 y.o.   MRN: [id number]  HPI: [name] is a 67 y.o. male  Chief Complaint  Patient presents with   Diabetes   Hyperlipidemia   Hypertension    HYPERLIPIDEMIA-  Does not have an appt with Cardiology. tolerating the pravastatin 1x a week Hyperlipidemia status: good compliance Satisfied with current treatment?  yes Side effects:  not on this dose Medication compliance: good compliance Aspirin:  no The 10-year ASCVD risk score (Arnett DK, et al., 2019) is: 30.3%   Values used to calculate the score:     Age: 44 years     Sex: Male     Is Non-Hispanic African American: No     Diabetic: Yes     Tobacco smoker: No     Systolic Blood Pressure: 706 mmHg     Is BP treated: Yes     HDL Cholesterol: 31 mg/dL     Total Cholesterol: 196 mg/dL Chest pain:  no Coronary artery disease:  yes  DEPRESSION Patient states he feels like the increased dose of Wellbutrin is going alright.  He feels like his motivation has been better.   Mood status: better Satisfied with current treatment?: no Symptom severity: mild  Duration of current treatment :  about a month Side effects: no Medication compliance: good compliance Psychotherapy/counseling: no  Previous psychiatric medications: celexa, wellbutrin Depressed mood: yes Anxious mood: yes Anhedonia: no Significant weight loss or gain: yes Insomnia: no  Fatigue: yes Feelings of worthlessness or guilt: no Impaired concentration/indecisiveness: no Suicidal ideations: no Hopelessness: no Crying spells: no    01/22/2023   11:30 AM 10/21/2022    9:39 AM 07/19/2022   11:07 AM 05/31/2022   11:42 AM 04/19/2022    8:15 AM  Depression screen PHQ 2/9  Decreased Interest 1 0 '1 1 2  '$ Down, Depressed, Hopeless 0 0 1 0 2  PHQ - 2 Score 1 0 '2 1 4   '$ Altered sleeping 1 0 2 0 2  Tired, decreased energy '1 1 2 1 3  '$ Change in appetite '1 1 1 1 2  '$ Feeling bad or failure about yourself  0 0 1 0 1  Trouble concentrating 0 1 0 0 2  Moving slowly or fidgety/restless 1 1 0 2 0  Suicidal thoughts 0 0 0 0 0  PHQ-9 Score '5 4 8 5 14  '$ Difficult doing work/chores Somewhat difficult Somewhat difficult Somewhat difficult Not difficult at all Not difficult at all   DIABETES Has been out of Ozempic for 2.5 weeks.  Can feel his cravings coming back. Hypoglycemic episodes:no Polydipsia/polyuria: no Visual disturbance: no Chest pain: no Paresthesias: no Glucose Monitoring: yes  Accucheck frequency: Daily  Fasting glucose: 117  Post prandial:  Evening:  Before meals: Taking Insulin?: no  Long acting insulin:  Short acting insulin: Blood Pressure Monitoring: not checking Retinal Examination: Up to Date Foot Exam: Up to Date Diabetic Education: Not Completed Pneumovax: Up to Date Influenza: Up to Date Aspirin: no   Relevant past medical, surgical, family and social history reviewed and updated as indicated. Interim medical history since our last visit reviewed. Allergies and medications reviewed and updated.  Review of Systems  Constitutional: Negative.  Eyes:  Negative for visual disturbance.  Respiratory: Negative.  Negative for chest tightness and shortness of breath.   Cardiovascular: Negative.  Negative for chest pain, palpitations and leg swelling.  Gastrointestinal: Negative.   Endocrine: Negative for polydipsia and polyuria.  Musculoskeletal:        Left foot pain  Neurological: Negative.  Negative for dizziness, light-headedness, numbness and headaches.  Psychiatric/Behavioral:  Positive for dysphoric mood. Negative for agitation, behavioral problems, confusion, decreased concentration, hallucinations, self-injury, sleep disturbance and suicidal ideas. The patient is not nervous/anxious and is not hyperactive.     Per HPI  unless specifically indicated above     Objective:    BP 120/72   Pulse 65   Temp 97.6 F (36.4 C) (Oral)   Wt 253 lb 12.8 oz (115.1 kg)   SpO2 95%   BMI 36.42 kg/m   Wt Readings from Last 3 Encounters:  01/22/23 253 lb 12.8 oz (115.1 kg)  10/21/22 252 lb 14.4 oz (114.7 kg)  07/19/22 262 lb 11.2 oz (119.2 kg)    Physical Exam Vitals and nursing note reviewed.  Constitutional:      General: He is not in acute distress.    Appearance: Normal appearance. He is not ill-appearing, toxic-appearing or diaphoretic.  HENT:     Head: Normocephalic and atraumatic.     Right Ear: External ear normal.     Left Ear: External ear normal.     Nose: Nose normal. No congestion or rhinorrhea.     Mouth/Throat:     Mouth: Mucous membranes are moist.     Pharynx: Oropharynx is clear.  Eyes:     General: No scleral icterus.       Right eye: No discharge.        Left eye: No discharge.     Extraocular Movements: Extraocular movements intact.     Conjunctiva/sclera: Conjunctivae normal.     Pupils: Pupils are equal, round, and reactive to light.  Cardiovascular:     Rate and Rhythm: Normal rate and regular rhythm.     Heart sounds: No murmur heard. Pulmonary:     Effort: Pulmonary effort is normal. No respiratory distress.     Breath sounds: Normal breath sounds. No wheezing, rhonchi or rales.     Comments: Speaking in full sentences Abdominal:     General: Abdomen is flat. Bowel sounds are normal.  Musculoskeletal:        General: Normal range of motion.     Cervical back: Normal range of motion and neck supple.     Left foot: Tenderness present.     Comments: Heal and arch pain with palpation  Skin:    General: Skin is warm and dry.     Capillary Refill: Capillary refill takes less than 2 seconds.     Coloration: Skin is not jaundiced or pale.     Findings: No bruising, erythema, lesion or rash.  Neurological:     General: No focal deficit present.     Mental Status: He is alert  and oriented to person, place, and time. Mental status is at baseline.  Psychiatric:        Mood and Affect: Mood normal.        Behavior: Behavior normal.        Thought Content: Thought content normal.        Judgment: Judgment normal.     Results for orders placed or performed in visit on 10/21/22  TSH  Result Value Ref  Range   TSH 2.390 0.450 - 4.500 uIU/mL  PSA  Result Value Ref Range   Prostate Specific Ag, Serum 2.3 0.0 - 4.0 ng/mL  Lipid panel  Result Value Ref Range   Cholesterol, Total 196 100 - 199 mg/dL   Triglycerides 194 (H) 0 - 149 mg/dL   HDL 31 (L) >39 mg/dL   VLDL Cholesterol Cal 35 5 - 40 mg/dL   LDL Chol Calc (NIH) 130 (H) 0 - 99 mg/dL   Chol/HDL Ratio 6.3 (H) 0.0 - 5.0 ratio  CBC with Differential/Platelet  Result Value Ref Range   WBC 7.0 3.4 - 10.8 x10E3/uL   RBC 5.02 4.14 - 5.80 x10E6/uL   Hemoglobin 13.6 13.0 - 17.7 g/dL   Hematocrit 41.1 37.5 - 51.0 %   MCV 82 79 - 97 fL   MCH 27.1 26.6 - 33.0 pg   MCHC 33.1 31.5 - 35.7 g/dL   RDW 13.8 11.6 - 15.4 %   Platelets 161 150 - 450 x10E3/uL   Neutrophils 66 Not Estab. %   Lymphs 21 Not Estab. %   Monocytes 6 Not Estab. %   Eos 6 Not Estab. %   Basos 1 Not Estab. %   Neutrophils Absolute 4.6 1.4 - 7.0 x10E3/uL   Lymphocytes Absolute 1.5 0.7 - 3.1 x10E3/uL   Monocytes Absolute 0.4 0.1 - 0.9 x10E3/uL   EOS (ABSOLUTE) 0.4 0.0 - 0.4 x10E3/uL   Basophils Absolute 0.1 0.0 - 0.2 x10E3/uL   Immature Granulocytes 0 Not Estab. %   Immature Grans (Abs) 0.0 0.0 - 0.1 x10E3/uL  Comprehensive metabolic panel  Result Value Ref Range   Glucose 98 70 - 99 mg/dL   BUN 16 8 - 27 mg/dL   Creatinine, Ser 1.16 0.76 - 1.27 mg/dL   eGFR 70 >59 mL/min/1.73   BUN/Creatinine Ratio 14 10 - 24   Sodium 140 134 - 144 mmol/L   Potassium 4.6 3.5 - 5.2 mmol/L   Chloride 105 96 - 106 mmol/L   CO2 22 20 - 29 mmol/L   Calcium 9.2 8.6 - 10.2 mg/dL   Total Protein 6.2 6.0 - 8.5 g/dL   Albumin 4.1 3.9 - 4.9 g/dL   Globulin,  Total 2.1 1.5 - 4.5 g/dL   Albumin/Globulin Ratio 2.0 1.2 - 2.2   Bilirubin Total 0.2 0.0 - 1.2 mg/dL   Alkaline Phosphatase 76 44 - 121 IU/L   AST 18 0 - 40 IU/L   ALT 14 0 - 44 IU/L  Urinalysis, Routine w reflex microscopic  Result Value Ref Range   Specific Gravity, UA 1.010 1.005 - 1.030   pH, UA 5.5 5.0 - 7.5   Color, UA Yellow Yellow   Appearance Ur Clear Clear   Leukocytes,UA Negative Negative   Protein,UA Negative Negative/Trace   Glucose, UA Negative Negative   Ketones, UA Negative Negative   RBC, UA Negative Negative   Bilirubin, UA Negative Negative   Urobilinogen, Ur 0.2 0.2 - 1.0 mg/dL   Nitrite, UA Negative Negative   Microscopic Examination Comment   HgB A1c  Result Value Ref Range   Hgb A1c MFr Bld 5.9 (H) 4.8 - 5.6 %   Est. average glucose Bld gHb Est-mCnc 123 mg/dL  Microalbumin, Urine Waived  Result Value Ref Range   Microalb, Ur Waived 10 0 - 19 mg/L   Creatinine, Urine Waived 50 10 - 300 mg/dL   Microalb/Creat Ratio <30 <30 mg/g      Assessment & Plan:   Problem  List Items Addressed This Visit       Cardiovascular and Mediastinum   Essential hypertension    Chronic.  Controlled.  Continue with current medication regimen of Losartan and Metoprolol.  Labs ordered today.  Return to clinic in 3 months for reevaluation.  Call sooner if concerns arise.        Aortic atherosclerosis (HCC)    Chronic.  Controlled.  Continue with current medication regimen.  Continue with Plavix.  On Pravastatin weekly. Labs ordered today.  Return to clinic in 3 months for reevaluation.  Call sooner if concerns arise.        CHF (congestive heart failure) (HCC)    Chronic.  Continue with current medication regimen.  Continue to follow up with Cardiology.  Follow up in 3 months.  Call sooner if concerns arise.   - Reminded to call for an overnight weight gain of >2 pounds or a weekly weight gain of >5 pounds - not adding salt to food and read food labels. Reviewed the  importance of keeping daily sodium intake to '2000mg'$  daily. - Avoid Ibuprofen products.         Endocrine   Hyperlipidemia associated with type 2 diabetes mellitus (HCC)    Chronic.  Controlled.  Continue with current medication regimen of Pravastatin weekly.  Labs ordered today.  Return to clinic in 3 months for reevaluation.  Call sooner if concerns arise.        Relevant Orders   Lipid Profile   Type 2 diabetes mellitus with diabetic neuropathy, unspecified ([place]) - Primary    Chronic.  Controlled at 6.2.  Sugars are running 117.  Has been out of Ozempic for 2.5 weeks.  Needs PA which was done while patient was in the office and approved.  Continue with current medication regimen.  Labs ordered today.  Return to clinic in 3 months for reevaluation.  Call sooner if concerns arise.        Relevant Medications   Lancets (ONETOUCH ULTRASOFT) lancets   Blood Glucose Monitoring Suppl (ONE TOUCH ULTRA 2) w/Device KIT   glucose blood (ONETOUCH ULTRA) test strip   Other Relevant Orders   Comp Met (CMET)   HgB A1c     Other   Mild episode of recurrent major depressive disorder (HCC)    Chronic.  Controlled.  Continue with current medication regimen of Celexa and Wellbutrin.  Labs ordered today.  Return to clinic in 6 months for reevaluation.  Call sooner if concerns arise.        Obesity (BMI 30-39.9)    Recommended eating smaller high protein, low fat meals more frequently and exercising 30 mins a day 5 times a week with a goal of 10-15lb weight loss in the next 3 months.           Follow up plan: Return in about 3 months (around 04/22/2023) for Welcome to Medicare.

## 2023-01-22 NOTE — Assessment & Plan Note (Signed)
Chronic.  Controlled.  Continue with current medication regimen of Celexa and Wellbutrin.  Labs ordered today.  Return to clinic in 6 months for reevaluation.  Call sooner if concerns arise.

## 2023-01-22 NOTE — Assessment & Plan Note (Signed)
Chronic.  Controlled.  Continue with current medication regimen.  Continue with Plavix.  On Pravastatin weekly. Labs ordered today.  Return to clinic in 3 months for reevaluation.  Call sooner if concerns arise.

## 2023-01-22 NOTE — Assessment & Plan Note (Signed)
Chronic.  Continue with current medication regimen.  Continue to follow up with Cardiology.  Follow up in 3 months.  Call sooner if concerns arise.   - Reminded to call for an overnight weight gain of >2 pounds or a weekly weight gain of >5 pounds - not adding salt to food and read food labels. Reviewed the importance of keeping daily sodium intake to '2000mg'$  daily. - Avoid Ibuprofen products.

## 2023-01-22 NOTE — Assessment & Plan Note (Signed)
Chronic.  Controlled.  Continue with current medication regimen of Losartan and Metoprolol.  Labs ordered today.  Return to clinic in 3 months for reevaluation.  Call sooner if concerns arise.

## 2023-01-22 NOTE — Telephone Encounter (Signed)
PA for Ozempic submitted and approved via Cover My Meds.   Patient was notified of approval before leaving today's appointment.

## 2023-01-23 LAB — COMPREHENSIVE METABOLIC PANEL
ALT: 13 IU/L (ref 0–44)
AST: 15 IU/L (ref 0–40)
Albumin/Globulin Ratio: 1.8 (ref 1.2–2.2)
Albumin: 4.3 g/dL (ref 3.9–4.9)
Alkaline Phosphatase: 83 IU/L (ref 44–121)
BUN/Creatinine Ratio: 9 — ABNORMAL LOW (ref 10–24)
BUN: 12 mg/dL (ref 8–27)
Bilirubin Total: 0.5 mg/dL (ref 0.0–1.2)
CO2: 24 mmol/L (ref 20–29)
Calcium: 8.9 mg/dL (ref 8.6–10.2)
Chloride: 106 mmol/L (ref 96–106)
Creatinine, Ser: 1.27 mg/dL (ref 0.76–1.27)
Globulin, Total: 2.4 g/dL (ref 1.5–4.5)
Glucose: 108 mg/dL — ABNORMAL HIGH (ref 70–99)
Potassium: 4.5 mmol/L (ref 3.5–5.2)
Sodium: 142 mmol/L (ref 134–144)
Total Protein: 6.7 g/dL (ref 6.0–8.5)
eGFR: 63 mL/min/{1.73_m2} (ref >59)

## 2023-01-23 LAB — LIPID PANEL
Chol/HDL Ratio: 7.4 ratio — ABNORMAL HIGH (ref 0.0–5.0)
Cholesterol, Total: 238 mg/dL — ABNORMAL HIGH (ref 100–199)
HDL: 32 mg/dL — ABNORMAL LOW (ref >39)
LDL Chol Calc (NIH): 146 mg/dL — ABNORMAL HIGH (ref 0–99)
Triglycerides: 326 mg/dL — ABNORMAL HIGH (ref 0–149)
VLDL Cholesterol Cal: 60 mg/dL — ABNORMAL HIGH (ref 5–40)

## 2023-01-23 LAB — HEMOGLOBIN A1C
Est. average glucose Bld gHb Est-mCnc: 123 mg/dL
Hgb A1c MFr Bld: 5.9 % — ABNORMAL HIGH (ref 4.8–5.6)

## 2023-01-23 NOTE — Progress Notes (Signed)
Please let patient know that his A1c is still well controlled at 5.9.  His cholesterol did increase.  I recommend he increase the Pravastatin to twice weekly if he is able to tolerate it.  No other concerns at this time.  Follow up as discussed.

## 2023-03-12 DIAGNOSIS — I2510 Atherosclerotic heart disease of native coronary artery without angina pectoris: Secondary | ICD-10-CM | POA: Diagnosis not present

## 2023-03-12 DIAGNOSIS — I10 Essential (primary) hypertension: Secondary | ICD-10-CM | POA: Diagnosis not present

## 2023-03-12 DIAGNOSIS — Z955 Presence of coronary angioplasty implant and graft: Secondary | ICD-10-CM | POA: Diagnosis not present

## 2023-03-12 DIAGNOSIS — E782 Mixed hyperlipidemia: Secondary | ICD-10-CM | POA: Diagnosis not present

## 2023-04-15 ENCOUNTER — Other Ambulatory Visit: Payer: Self-pay | Admitting: Nurse Practitioner

## 2023-04-16 NOTE — Telephone Encounter (Signed)
Requested Prescriptions  Pending Prescriptions Disp Refills   buPROPion (WELLBUTRIN XL) 300 MG 24 hr tablet [Pharmacy Med Name: BUPROPION XL 300MG  TABLETS] 90 tablet 0    Sig: TAKE 1 TABLET(300 MG) BY MOUTH DAILY     Psychiatry: Antidepressants - bupropion Passed - 04/15/2023  6:22 AM      Passed - Cr in normal range and within 360 days    Creatinine  Date Value Ref Range Status  07/15/2014 1.07 0.60 - 1.30 mg/dL Final   Creatinine, Ser  Date Value Ref Range Status  01/22/2023 1.27 0.76 - 1.27 mg/dL Final         Passed - AST in normal range and within 360 days    AST  Date Value Ref Range Status  01/22/2023 15 0 - 40 IU/L Final         Passed - ALT in normal range and within 360 days    ALT  Date Value Ref Range Status  01/22/2023 13 0 - 44 IU/L Final         Passed - Completed PHQ-2 or PHQ-9 in the last 360 days      Passed - Last BP in normal range    BP Readings from Last 1 Encounters:  01/22/23 120/72         Passed - Valid encounter within last 6 months    Recent Outpatient Visits           2 months ago Type 2 diabetes mellitus with diabetic neuropathy, without long-term current use of insulin (HCC)   [place] [facility] Larae Grooms, NP   5 months ago Annual physical exam   [facility] Larae Grooms, NP   9 months ago Unstable angina [facility])   [place] [facility] Larae Grooms, NP   10 months ago Coronary artery disease involving native coronary artery of native heart with unstable angina pectoris [facility])   [place] [facility] [place], Megan P, DO   12 months ago Congestive heart failure, unspecified HF chronicity, unspecified heart failure type [facility])   [place] [facility] Larae Grooms, NP       Future Appointments             In 6 days Larae Grooms, NP [place] [facility]

## 2023-04-21 NOTE — Progress Notes (Unsigned)
There were no vitals taken for this visit.   Subjective:    Patient ID: Steven Vang, male    DOB: [birth year], 67 y.o.   MRN: [id number]  HPI: Steven Vang is a 67 y.o. male presenting on 04/22/2023 for comprehensive medical examination. Current medical complaints include:{Blank single:19197::"none","***"}  He currently lives with: Interim Problems from his last visit: {Blank single:19197::"yes","no"}  HYPERLIPIDEMIA-  Does not have an appt with Cardiology. tolerating the pravastatin 1x a week Hyperlipidemia status: good compliance Satisfied with current treatment?  yes Side effects:  not on this dose Medication compliance: good compliance Aspirin:  no The 10-year ASCVD risk score (Arnett DK, et al., 2019) is: 30.3%   Values used to calculate the score:     Age: 83 years     Sex: Male     Is Non-Hispanic African American: No     Diabetic: Yes     Tobacco smoker: No     Systolic Blood Pressure: 120 mmHg     Is BP treated: Yes     HDL Cholesterol: 31 mg/dL     Total Cholesterol: 196 mg/dL Chest pain:  no Coronary artery disease:  yes  DEPRESSION Patient states he feels like the increased dose of Wellbutrin is going alright.  He feels like his motivation has been better.   Mood status: better Satisfied with current treatment?: no Symptom severity: mild  Duration of current treatment : about a month Side effects: no Medication compliance: good compliance Psychotherapy/counseling: no  Previous psychiatric medications: celexa, wellbutrin Depressed mood: yes Anxious mood: yes Anhedonia: no Significant weight loss or gain: yes Insomnia: no  Fatigue: yes Feelings of worthlessness or guilt: no Impaired concentration/indecisiveness: no Suicidal ideations: no Hopelessness: no Crying spells: no    01/22/2023   11:30 AM 10/21/2022    9:39 AM 07/19/2022   11:07 AM 05/31/2022   11:42 AM 04/19/2022    8:15 AM  Depression screen PHQ 2/9  Decreased Interest 1 0 1 1 2   Down,  Depressed, Hopeless 0 0 1 0 2  PHQ - 2 Score 1 0 2 1 4   Altered sleeping 1 0 2 0 2  Tired, decreased energy 1 1 2 1 3   Change in appetite 1 1 1 1 2   Feeling bad or failure about yourself  0 0 1 0 1  Trouble concentrating 0 1 0 0 2  Moving slowly or fidgety/restless 1 1 0 2 0  Suicidal thoughts 0 0 0 0 0  PHQ-9 Score 5 4 8 5 14   Difficult doing work/chores Somewhat difficult Somewhat difficult Somewhat difficult Not difficult at all Not difficult at all   DIABETES Has been out of Ozempic for 2.5 weeks.  Can feel his cravings coming back. Hypoglycemic episodes:no Polydipsia/polyuria: no Visual disturbance: no Chest pain: no Paresthesias: no Glucose Monitoring: yes  Accucheck frequency: Daily  Fasting glucose: 117  Post prandial:  Evening:  Before meals: Taking Insulin?: no  Long acting insulin:  Short acting insulin: Blood Pressure Monitoring: not checking Retinal Examination: Up to Date Foot Exam: Up to Date Diabetic Education: Not Completed Pneumovax: Up to Date Influenza: Up to Date Aspirin: no  Functional Status Survey:    FALL RISK:    01/22/2023   11:30 AM 10/21/2022    9:39 AM 05/31/2022   11:42 AM 04/19/2022    8:15 AM 11/16/2021   11:31 AM  Fall Risk   Falls in the past year? 0 0 0 0 0  Number  falls in past yr: 0 0 0 0   Injury with Fall? 0 0 0 0 0  Risk for fall due to : No Fall Risks No Fall Risks No Fall Risks No Fall Risks   Follow up Falls evaluation completed Falls evaluation completed Falls evaluation completed Falls evaluation completed     Depression Screen    01/22/2023   11:30 AM 10/21/2022    9:39 AM 07/19/2022   11:07 AM 05/31/2022   11:42 AM 04/19/2022    8:15 AM  Depression screen PHQ 2/9  Decreased Interest 1 0 1 1 2   Down, Depressed, Hopeless 0 0 1 0 2  PHQ - 2 Score 1 0 2 1 4   Altered sleeping 1 0 2 0 2  Tired, decreased energy 1 1 2 1 3   Change in appetite 1 1 1 1 2   Feeling bad or failure about yourself  0 0 1 0 1  Trouble  concentrating 0 1 0 0 2  Moving slowly or fidgety/restless 1 1 0 2 0  Suicidal thoughts 0 0 0 0 0  PHQ-9 Score 5 4 8 5 14   Difficult doing work/chores Somewhat difficult Somewhat difficult Somewhat difficult Not difficult at all Not difficult at all    Advanced Directives Does patient have a HCPOA?    {Blank single:19197::"yes","no"} If yes, name and contact information:  Does patient have a living will or MOST form?  {Blank single:19197::"yes","no"}  Past Medical History:  Past Medical History:  Diagnosis Date   Abnormal EKG    Anxiety disorder    CAD (coronary artery disease)    CHF (congestive heart failure) (HCC)    Chronic systolic heart failure (HCC)    Depression    Diabetes mellitus (HCC)    GERD (gastroesophageal reflux disease)    Hypertension    Lipoma of head 03/31/2020   Mixed hyperlipidemia    Non morbid obesity due to excess calories     Surgical History:  Past Surgical History:  Procedure Laterality Date   ANKLE FRACTURE SURGERY Right    CARDIAC CATHETERIZATION     CHOLECYSTECTOMY     COLONOSCOPY N/A 11/28/2021   Procedure: COLONOSCOPY;  Surgeon: Toney Reil, MD;  Location: [facility];  Service: Gastroenterology;  Laterality: N/A;   CORONARY STENT PLACEMENT  2015   3 stents   HAND SURGERY Right    LEFT HEART CATH AND CORONARY ANGIOGRAPHY Left 06/02/2020   Procedure: LEFT HEART CATH AND CORONARY ANGIOGRAPHY;  Surgeon: Laurier Nancy, MD;  Location: [facility] INVASIVE CV LAB;  Service: Cardiovascular;  Laterality: Left;    Medications:  Current Outpatient Medications on File Prior to Visit  Medication Sig   aspirin 81 MG EC tablet Take 1 tablet by mouth daily.   Blood Glucose Monitoring Suppl (ONE TOUCH ULTRA 2) w/Device KIT 1 each by Does not apply route daily.   buPROPion (WELLBUTRIN XL) 300 MG 24 hr tablet TAKE 1 TABLET(300 MG) BY MOUTH DAILY   citalopram (CELEXA) 40 MG tablet Take 1 tablet (40 mg total) by mouth daily.   clopidogrel (PLAVIX)  75 MG tablet Take 1 tablet (75 mg total) by mouth at bedtime.   furosemide (LASIX) 20 MG tablet Hold until followup with your outpatient doctor because your blood pressure was normal without it and you had acute kidney injury.   gabapentin (NEURONTIN) 300 MG capsule Take 2 capsules (600 mg total) by mouth at bedtime as needed.   glucose blood (ONETOUCH ULTRA) test strip 1 each  by Other route daily. Use as instructed   Lancets (ONETOUCH ULTRASOFT) lancets Use as instructed   losartan (COZAAR) 50 MG tablet Take 1 tablet (50 mg total) by mouth daily.   metoprolol succinate (TOPROL-XL) 50 MG 24 hr tablet Take 1 tablet (50 mg total) by mouth daily.   nitroGLYCERIN (NITROSTAT) 0.4 MG SL tablet Place 1 tablet (0.4 mg total) under the tongue every 5 (five) minutes as needed for Chest pain May take up to 3 doses.   omeprazole (PRILOSEC) 20 MG capsule Take 1 capsule (20 mg total) by mouth 2 (two) times daily before a meal.   pravastatin (PRAVACHOL) 80 MG tablet Take 1 tablet (80 mg total) by mouth once a week. Take 1 tab once weekly   Semaglutide, 1 MG/DOSE, 4 MG/3ML SOPN Inject 1 mg as directed once a week.   traZODone (DESYREL) 50 MG tablet TAKE 1 TABLET BY MOUTH ONCE DAILY AT BEDTIME AS NEEDED   No current facility-administered medications on file prior to visit.    Allergies:  No Known Allergies  Social History:  Social History   Socioeconomic History   Marital status: Married    Spouse name: Not on file   Number of children: Not on file   Years of education: Not on file   Highest education level: Not on file  Occupational History   Not on file  Tobacco Use   Smoking status: Former    Types: Cigarettes    Quit date: 1990    Years since quitting: 34.3   Smokeless tobacco: Never  Vaping Use   Vaping Use: Never used  Substance and Sexual Activity   Alcohol use: Not Currently   Drug use: Never   Sexual activity: Not on file  Other Topics Concern   Not on file  Social History  Narrative   Not on file   Social Determinants of Health   Financial Resource Strain: Not on file  Food Insecurity: Not on file  Transportation Needs: Not on file  Physical Activity: Not on file  Stress: Not on file  Social Connections: Not on file  Intimate Partner Violence: Not on file   Social History   Tobacco Use  Smoking Status Former   Types: Cigarettes   Quit date: 1990   Years since quitting: 34.3  Smokeless Tobacco Never   Social History   Substance and Sexual Activity  Alcohol Use Not Currently    Family History:  Family History  Problem Relation Age of Onset   CAD Mother    Diabetes Mother    Colon cancer Father    Breast cancer Paternal Grandmother    Stomach cancer Paternal Grandfather     Past medical history, surgical history, medications, allergies, family history and social history reviewed with patient today and changes made to appropriate areas of the chart.   ROS All other ROS negative except what is listed above and in the HPI.      Objective:    There were no vitals taken for this visit.  Wt Readings from Last 3 Encounters:  01/22/23 253 lb 12.8 oz (115.1 kg)  10/21/22 252 lb 14.4 oz (114.7 kg)  07/19/22 262 lb 11.2 oz (119.2 kg)    No results found.  Physical Exam      No data to display          Cognitive Testing - 6-CIT  Correct? Score   What year is it? {YES NO:22349} {Numbers; 0-4:31231} Yes = 0  No = 4  What month is it? {YES NO:22349} {Numbers; 0-4:31231} Yes = 0    No = 3  Remember:     Floyde Parkins, [address], [place]     What time is it? {YES NO:22349} {Numbers; 0-4:31231} Yes = 0    No = 3  Count backwards from 20 to 1 {YES NO:22349} {Numbers; 0-4:31231} Correct = 0    1 error = 2   More than 1 error = 4  Say the months of the year in reverse. {YES NO:22349} {Numbers; 0-4:31231} Correct = 0    1 error = 2   More than 1 error = 4  What address did I ask you to remember? {YES NO:22349} {NUMBERS; 0-10:5044}  Correct = 0  1 error = 2    2 error = 4    3 error = 6    4 error = 8    All wrong = 10       TOTAL SCORE  {Numbers; 1-61:09604}/54   Interpretation:  {Desc; normal/abnormal:11317::"Normal"}  Normal (0-7) Abnormal (8-28)    Results for orders placed or performed in visit on 01/22/23  Comp Met (CMET)  Result Value Ref Range   Glucose 108 (H) 70 - 99 mg/dL   BUN 12 8 - 27 mg/dL   Creatinine, Ser 0.98 0.76 - 1.27 mg/dL   eGFR 63 >11 BJ/YNW/2.95   BUN/Creatinine Ratio 9 (L) 10 - 24   Sodium 142 134 - 144 mmol/L   Potassium 4.5 3.5 - 5.2 mmol/L   Chloride 106 96 - 106 mmol/L   CO2 24 20 - 29 mmol/L   Calcium 8.9 8.6 - 10.2 mg/dL   Total Protein 6.7 6.0 - 8.5 g/dL   Albumin 4.3 3.9 - 4.9 g/dL   Globulin, Total 2.4 1.5 - 4.5 g/dL   Albumin/Globulin Ratio 1.8 1.2 - 2.2   Bilirubin Total 0.5 0.0 - 1.2 mg/dL   Alkaline Phosphatase 83 44 - 121 IU/L   AST 15 0 - 40 IU/L   ALT 13 0 - 44 IU/L  Lipid Profile  Result Value Ref Range   Cholesterol, Total 238 (H) 100 - 199 mg/dL   Triglycerides 621 (H) 0 - 149 mg/dL   HDL 32 (L) >30 mg/dL   VLDL Cholesterol Cal 60 (H) 5 - 40 mg/dL   LDL Chol Calc (NIH) 865 (H) 0 - 99 mg/dL   Chol/HDL Ratio 7.4 (H) 0.0 - 5.0 ratio  HgB A1c  Result Value Ref Range   Hgb A1c MFr Bld 5.9 (H) 4.8 - 5.6 %   Est. average glucose Bld gHb Est-mCnc 123 mg/dL      Assessment & Plan:   Problem List Items Addressed This Visit       Cardiovascular and Mediastinum   Coronary artery disease   Essential hypertension - Primary   Aortic atherosclerosis (HCC)   CHF (congestive heart failure) (HCC)     Endocrine   Hyperlipidemia associated with type 2 diabetes mellitus (HCC)   Type 2 diabetes mellitus with diabetic neuropathy, unspecified (HCC)     Other   Mild episode of recurrent major depressive disorder (HCC)   Mixed hyperlipidemia   Other Visit Diagnoses     Advanced care planning/counseling discussion            Preventative Services:  Health  Risk Assessment and Personalized Prevention Plan: Bone Mass Measurements: CVD Screening:  Colon Cancer Screening:  Depression Screening:  Diabetes Screening:  Glaucoma  Screening:  Hepatitis B vaccine: Hepatitis C screening:  HIV Screening: Flu Vaccine: Lung cancer Screening: Obesity Screening:  Pneumonia Vaccines (2): STI Screening: PSA screening:  Discussed aspirin prophylaxis for myocardial infarction prevention and decision was {Blank single:19197::"it was not indicated","made to continue ASA","made to start ASA","made to stop ASA","that we recommended ASA, and patient refused"}  LABORATORY TESTING:  Health maintenance labs ordered today as discussed above.   The natural history of prostate cancer and ongoing controversy regarding screening and potential treatment outcomes of prostate cancer has been discussed with the patient. The meaning of a false positive PSA and a false negative PSA has been discussed. He indicates understanding of the limitations of this screening test and wishes *** to proceed with screening PSA testing.   IMMUNIZATIONS:   - Tdap: Tetanus vaccination status reviewed: {tetanus status:315746}. - Influenza: {Blank single:19197::"Up to date","Administered today","Postponed to flu season","Refused","Given elsewhere"} - Pneumovax: {Blank single:19197::"Up to date","Administered today","Not applicable","Refused","Given elsewhere"} - Prevnar: {Blank single:19197::"Up to date","Administered today","Not applicable","Refused","Given elsewhere"} - Zostavax vaccine: {Blank single:19197::"Up to date","Administered today","Not applicable","Refused","Given elsewhere"}  SCREENING: - Colonoscopy: {Blank single:19197::"Up to date","Ordered today","Not applicable","Refused","Done elsewhere"}  Discussed with patient purpose of the colonoscopy is to detect colon cancer at curable precancerous or early stages   - AAA Screening: {Blank single:19197::"Up to date","Ordered  today","Not applicable","Refused","Done elsewhere"}  -Hearing Test: {Blank single:19197::"Up to date","Ordered today","Not applicable","Refused","Done elsewhere"}  -Spirometry: {Blank single:19197::"Up to date","Ordered today","Not applicable","Refused","Done elsewhere"}   PATIENT COUNSELING:    Sexuality: Discussed sexually transmitted diseases, partner selection, use of condoms, avoidance of unintended pregnancy  and contraceptive alternatives.   Advised to avoid cigarette smoking.  I discussed with the patient that most people either abstain from alcohol or drink within safe limits (<=14/week and <=4 drinks/occasion for males, <=7/weeks and <= 3 drinks/occasion for females) and that the risk for alcohol disorders and other health effects rises proportionally with the number of drinks per week and how often a drinker exceeds daily limits.  Discussed cessation/primary prevention of drug use and availability of treatment for abuse.   Diet: Encouraged to adjust caloric intake to maintain  or achieve ideal body weight, to reduce intake of dietary saturated fat and total fat, to limit sodium intake by avoiding high sodium foods and not adding table salt, and to maintain adequate dietary potassium and calcium preferably from fresh fruits, vegetables, and low-fat dairy products.    stressed the importance of regular exercise  Injury prevention: Discussed safety belts, safety helmets, smoke detector, smoking near bedding or upholstery.   Dental health: Discussed importance of regular tooth brushing, flossing, and dental visits.   Follow up plan: NEXT PREVENTATIVE PHYSICAL DUE IN 1 YEAR. No follow-ups on file.

## 2023-04-22 ENCOUNTER — Ambulatory Visit (INDEPENDENT_AMBULATORY_CARE_PROVIDER_SITE_OTHER): Payer: Medicare Other | Admitting: Nurse Practitioner

## 2023-04-22 ENCOUNTER — Encounter: Payer: Self-pay | Admitting: Nurse Practitioner

## 2023-04-22 VITALS — BP 128/73 | HR 67 | Temp 98.3°F | Ht 70.5 in | Wt 253.2 lb

## 2023-04-22 DIAGNOSIS — E1140 Type 2 diabetes mellitus with diabetic neuropathy, unspecified: Secondary | ICD-10-CM

## 2023-04-22 DIAGNOSIS — Z0000 Encounter for general adult medical examination without abnormal findings: Secondary | ICD-10-CM | POA: Diagnosis not present

## 2023-04-22 DIAGNOSIS — Z7189 Other specified counseling: Secondary | ICD-10-CM

## 2023-04-22 DIAGNOSIS — E1169 Type 2 diabetes mellitus with other specified complication: Secondary | ICD-10-CM | POA: Diagnosis not present

## 2023-04-22 DIAGNOSIS — I700 Atherosclerosis of aorta: Secondary | ICD-10-CM

## 2023-04-22 DIAGNOSIS — Z7985 Long-term (current) use of injectable non-insulin antidiabetic drugs: Secondary | ICD-10-CM

## 2023-04-22 DIAGNOSIS — F330 Major depressive disorder, recurrent, mild: Secondary | ICD-10-CM

## 2023-04-22 DIAGNOSIS — I509 Heart failure, unspecified: Secondary | ICD-10-CM

## 2023-04-22 DIAGNOSIS — I10 Essential (primary) hypertension: Secondary | ICD-10-CM

## 2023-04-22 DIAGNOSIS — E785 Hyperlipidemia, unspecified: Secondary | ICD-10-CM

## 2023-04-22 DIAGNOSIS — I2510 Atherosclerotic heart disease of native coronary artery without angina pectoris: Secondary | ICD-10-CM

## 2023-04-22 DIAGNOSIS — E782 Mixed hyperlipidemia: Secondary | ICD-10-CM

## 2023-04-22 NOTE — Assessment & Plan Note (Signed)
Chronic. Followed by Cardiology.  Continue with current medication regimen.  Follow up in 3 months.  Call sooner if concerns arise.

## 2023-04-22 NOTE — Assessment & Plan Note (Signed)
Chronic.  Continue with current medication regimen.  On lasix daily.  Continue to follow up with Cardiology.  Follow up in 3 months.  Call sooner if concerns arise.   - Reminded to call for an overnight weight gain of >2 pounds or a weekly weight gain of >5 pounds - not adding salt to food and read food labels. Reviewed the importance of keeping daily sodium intake to 2000mg  daily. - Avoid Ibuprofen products.

## 2023-04-22 NOTE — Assessment & Plan Note (Signed)
Chronic.  Controlled.  Continue with current medication regimen.  Continue with Plavix.  On Pravastatin weekly.  Tolerating weekly Pravastatin.  Labs ordered today.  Return to clinic in 3 months for reevaluation.  Call sooner if concerns arise.

## 2023-04-22 NOTE — Assessment & Plan Note (Signed)
A voluntary discussion about advance care planning including the explanation and discussion of advance directives was extensively discussed  with the patient for 5 minutes with patient.  Explanation about the health care proxy and Living will was reviewed and packet with forms with explanation of how to fill them out was given.  During this discussion, the patient was able to identify a health care proxy as his wife and plans to fill out the paperwork required.  Patient was offered a separate Advance Care Planning visit for further assistance with forms.

## 2023-04-22 NOTE — Assessment & Plan Note (Signed)
Chronic.  Controlled.  Continue with current medication regimen of Losartan and Metoprolol.  Labs ordered today.  Return to clinic in 3 months for reevaluation.  Call sooner if concerns arise.   

## 2023-04-22 NOTE — Assessment & Plan Note (Signed)
Chronic.  Controlled.  Continue with current medication regimen of Pravastatin weekly.  Labs ordered today.  Return to clinic in 3 months for reevaluation.  Call sooner if concerns arise.   

## 2023-04-22 NOTE — Assessment & Plan Note (Signed)
Chronic.  Controlled at 5.9.  Sugars are running 130-160.  Admits that he has not been eating as well.  Tolerating Ozempic well.  Continue with current medication regimen.  Labs ordered today.  Return to clinic in 3 months for reevaluation.  Call sooner if concerns arise.

## 2023-04-22 NOTE — Progress Notes (Signed)
Results discussed with patient during visit.

## 2023-04-22 NOTE — Assessment & Plan Note (Signed)
Chronic.  Controlled.  Continue with current medication regimen of Celexa and Wellbutrin.  Labs ordered today.  Return to clinic in 6 months for reevaluation.  Call sooner if concerns arise.   

## 2023-04-22 NOTE — Assessment & Plan Note (Signed)
Chronic, stable. Continue current regimen of Pravastatin weekly. Check lipid panel today. Follow-up in 6 months.

## 2023-04-22 NOTE — Assessment & Plan Note (Signed)
Recommended eating smaller high protein, low fat meals more frequently and exercising 30 mins a day 5 times a week with a goal of 10-15lb weight loss in the next 3 months.  

## 2023-04-23 LAB — LIPID PANEL
Chol/HDL Ratio: 6.1 ratio — ABNORMAL HIGH (ref 0.0–5.0)
Cholesterol, Total: 188 mg/dL (ref 100–199)
HDL: 31 mg/dL — ABNORMAL LOW (ref >39)
LDL Chol Calc (NIH): 117 mg/dL — ABNORMAL HIGH (ref 0–99)
Triglycerides: 227 mg/dL — ABNORMAL HIGH (ref 0–149)
VLDL Cholesterol Cal: 40 mg/dL (ref 5–40)

## 2023-04-23 LAB — COMPREHENSIVE METABOLIC PANEL
ALT: 15 IU/L (ref 0–44)
AST: 20 IU/L (ref 0–40)
Albumin/Globulin Ratio: 1.8 (ref 1.2–2.2)
Albumin: 4.1 g/dL (ref 3.9–4.9)
Alkaline Phosphatase: 76 IU/L (ref 44–121)
BUN/Creatinine Ratio: 11 (ref 10–24)
BUN: 16 mg/dL (ref 8–27)
Bilirubin Total: <0.2 mg/dL (ref 0.0–1.2)
CO2: 20 mmol/L (ref 20–29)
Calcium: 8.8 mg/dL (ref 8.6–10.2)
Chloride: 107 mmol/L — ABNORMAL HIGH (ref 96–106)
Creatinine, Ser: 1.42 mg/dL — ABNORMAL HIGH (ref 0.76–1.27)
Globulin, Total: 2.3 g/dL (ref 1.5–4.5)
Glucose: 88 mg/dL (ref 70–99)
Potassium: 4.7 mmol/L (ref 3.5–5.2)
Sodium: 141 mmol/L (ref 134–144)
Total Protein: 6.4 g/dL (ref 6.0–8.5)
eGFR: 55 mL/min/{1.73_m2} — ABNORMAL LOW (ref >59)

## 2023-04-23 LAB — HEMOGLOBIN A1C
Est. average glucose Bld gHb Est-mCnc: 123 mg/dL
Hgb A1c MFr Bld: 5.9 % — ABNORMAL HIGH (ref 4.8–5.6)

## 2023-04-23 NOTE — Addendum Note (Signed)
Addended by: Larae Grooms on: 04/23/2023 08:25 AM   Modules accepted: Orders

## 2023-04-23 NOTE — Progress Notes (Signed)
LVM for patient to call office back and get scheduled for 1 week lab only visit.  Also put in CRM.

## 2023-04-23 NOTE — Progress Notes (Signed)
Hi Steven Vang. It was nice to see you yesterday.  Your lab work looks good.  Your A1c remains well controlled at 5.9%.  Your creatinine is elevated.  I recommend increasing your water intake to at least 64 ounces daily.  I would like you to come back in 1 week as a lab visit and have this rechecked.  No other concerns at this time. Continue with your current medication regimen.  Follow up as discussed.  Please let me know if you have any questions.

## 2023-04-30 ENCOUNTER — Ambulatory Visit
Admission: RE | Admit: 2023-04-30 | Discharge: 2023-04-30 | Disposition: A | Discharge status: Home / Self Care | Payer: Medicare Other | Source: Ambulatory Visit | Attending: Nurse Practitioner | Admitting: Nurse Practitioner

## 2023-04-30 DIAGNOSIS — Z0000 Encounter for general adult medical examination without abnormal findings: Secondary | ICD-10-CM | POA: Insufficient documentation

## 2023-04-30 DIAGNOSIS — Z87891 Personal history of nicotine dependence: Secondary | ICD-10-CM | POA: Insufficient documentation

## 2023-04-30 DIAGNOSIS — Z136 Encounter for screening for cardiovascular disorders: Secondary | ICD-10-CM | POA: Diagnosis not present

## 2023-04-30 NOTE — Progress Notes (Signed)
HI Steven Vang.  There was no aneurysm found on your scan.

## 2023-05-19 ENCOUNTER — Other Ambulatory Visit: Payer: Self-pay | Admitting: Nurse Practitioner

## 2023-05-20 NOTE — Telephone Encounter (Signed)
Requested Prescriptions  Pending Prescriptions Disp Refills   OZEMPIC, 1 MG/DOSE, 4 MG/3ML SOPN [Pharmacy Med Name: OZEMPIC 1MG  PER DOSE (4MG /3ML) PFP] 9 mL 0    Sig: INJECT 1 MG UNDER THE SKIN ONE DAY A WEEK     Endocrinology:  Diabetes - GLP-1 Receptor Agonists - semaglutide Failed - 05/19/2023  7:46 PM      Failed - HBA1C in normal range and within 180 days    Hemoglobin A1C  Date Value Ref Range Status  06/20/2018 6.8  Final   HB A1C (BAYER DCA - WAIVED)  Date Value Ref Range Status  01/26/2021 6.5 <7.0 % Final    Comment:                                          Diabetic Adult            <7.0                                       Healthy Adult        4.3 - 5.7                                                           (DCCT/NGSP) American Diabetes Association's Summary of Glycemic Recommendations for Adults with Diabetes: Hemoglobin A1c <7.0%. More stringent glycemic goals (A1c <6.0%) may further reduce complications at the cost of increased risk of hypoglycemia.    Hgb A1c MFr Bld  Date Value Ref Range Status  04/22/2023 5.9 (H) 4.8 - 5.6 % Final    Comment:             Prediabetes: 5.7 - 6.4          Diabetes: >6.4          Glycemic control for adults with diabetes: <7.0          Failed - Cr in normal range and within 360 days    Creatinine  Date Value Ref Range Status  07/15/2014 1.07 0.60 - 1.30 mg/dL Final   Creatinine, Ser  Date Value Ref Range Status  04/22/2023 1.42 (H) 0.76 - 1.27 mg/dL Final         Passed - Valid encounter within last 6 months    Recent Outpatient Visits           4 weeks ago Welcome to [employer] preventive visit   [place] [facility] Larae Grooms, NP   3 months ago Type 2 diabetes mellitus with diabetic neuropathy, without long-term current use of insulin (HCC)   [place] [facility] Larae Grooms, NP   7 months ago Annual physical exam   [place] [facility] Larae Grooms, NP   10 months ago Unstable angina [facility])   [place] [facility] Larae Grooms, NP   11 months ago Coronary artery disease involving native coronary artery of native heart with unstable angina pectoris [facility])   [place] [facility] Dorcas Carrow, DO       Future Appointments  In 2 months Mecum, Oswaldo Conroy, PA-C [facility], [facility]

## 2023-07-15 DIAGNOSIS — I2510 Atherosclerotic heart disease of native coronary artery without angina pectoris: Secondary | ICD-10-CM | POA: Diagnosis not present

## 2023-07-15 DIAGNOSIS — Z955 Presence of coronary angioplasty implant and graft: Secondary | ICD-10-CM | POA: Diagnosis not present

## 2023-07-15 DIAGNOSIS — I10 Essential (primary) hypertension: Secondary | ICD-10-CM | POA: Diagnosis not present

## 2023-07-15 DIAGNOSIS — E782 Mixed hyperlipidemia: Secondary | ICD-10-CM | POA: Diagnosis not present

## 2023-07-23 ENCOUNTER — Encounter: Payer: Self-pay | Admitting: Physician Assistant

## 2023-07-23 ENCOUNTER — Ambulatory Visit (INDEPENDENT_AMBULATORY_CARE_PROVIDER_SITE_OTHER): Payer: Medicare Other | Admitting: Physician Assistant

## 2023-07-23 VITALS — BP 99/65 | HR 63 | Ht 70.5 in | Wt 248.4 lb

## 2023-07-23 DIAGNOSIS — E785 Hyperlipidemia, unspecified: Secondary | ICD-10-CM | POA: Diagnosis not present

## 2023-07-23 DIAGNOSIS — E1169 Type 2 diabetes mellitus with other specified complication: Secondary | ICD-10-CM

## 2023-07-23 DIAGNOSIS — E1140 Type 2 diabetes mellitus with diabetic neuropathy, unspecified: Secondary | ICD-10-CM

## 2023-07-23 DIAGNOSIS — I25110 Atherosclerotic heart disease of native coronary artery with unstable angina pectoris: Secondary | ICD-10-CM | POA: Diagnosis not present

## 2023-07-23 DIAGNOSIS — F330 Major depressive disorder, recurrent, mild: Secondary | ICD-10-CM | POA: Diagnosis not present

## 2023-07-23 DIAGNOSIS — I10 Essential (primary) hypertension: Secondary | ICD-10-CM

## 2023-07-23 DIAGNOSIS — Z7985 Long-term (current) use of injectable non-insulin antidiabetic drugs: Secondary | ICD-10-CM

## 2023-07-23 DIAGNOSIS — K219 Gastro-esophageal reflux disease without esophagitis: Secondary | ICD-10-CM

## 2023-07-23 DIAGNOSIS — M25551 Pain in right hip: Secondary | ICD-10-CM

## 2023-07-23 MED ORDER — METOPROLOL SUCCINATE ER 50 MG PO TB24: 50.0000 mg | ORAL_TABLET | Freq: Every day | ORAL | 1 refills | Status: DC

## 2023-07-23 MED ORDER — PRAVASTATIN SODIUM 80 MG PO TABS
80.0000 mg | ORAL_TABLET | ORAL | 0 refills | Status: AC
Start: 2023-07-23 — End: ?

## 2023-07-23 MED ORDER — LOSARTAN POTASSIUM 50 MG PO TABS: 50.0000 mg | ORAL_TABLET | Freq: Every day | ORAL | 1 refills | Status: DC

## 2023-07-23 MED ORDER — BUPROPION HCL ER (XL) 300 MG PO TB24: 300.0000 mg | ORAL_TABLET | Freq: Every day | ORAL | 1 refills | Status: DC

## 2023-07-23 MED ORDER — CITALOPRAM HYDROBROMIDE 40 MG PO TABS: 40.0000 mg | ORAL_TABLET | Freq: Every day | ORAL | 1 refills | Status: DC

## 2023-07-23 MED ORDER — OMEPRAZOLE 20 MG PO CPDR: 20.0000 mg | DELAYED_RELEASE_CAPSULE | Freq: Every day | ORAL | 1 refills | Status: DC

## 2023-07-23 MED ORDER — CLOPIDOGREL BISULFATE 75 MG PO TABS: 75.0000 mg | ORAL_TABLET | Freq: Every day | ORAL | 0 refills | Status: DC

## 2023-07-23 MED ORDER — OZEMPIC (1 MG/DOSE) 4 MG/3ML ~~LOC~~ SOPN: 1.0000 mg | PEN_INJECTOR | SUBCUTANEOUS | 0 refills | Status: DC

## 2023-07-23 MED ORDER — GABAPENTIN 300 MG PO CAPS: 600.0000 mg | ORAL_CAPSULE | Freq: Every evening | ORAL | 1 refills | Status: DC | PRN

## 2023-07-23 NOTE — Assessment & Plan Note (Addendum)
Chronic, controlled  He is taking Omeprazole 20 mg once per day, sometimes skips a day He reports he sometimes gets GI symptoms if he takes it more frequently so he takes as needed right now Will change to Omeprazole 20 mg PO every day  Refills provided Follow up in 6 months or sooner if concerns arise

## 2023-07-23 NOTE — Progress Notes (Signed)
Established Patient Office Visit  Name: Steven Vang   MRN: [id number]    DOB: [birth year]   Date:07/25/2023  Today's Provider: Jacquelin Hawking, MHS, PA-C Introduced myself to the patient as a PA-C and provided education on APPs in clinical practice.         Subjective  Chief Complaint  Chief Complaint  Patient presents with   Diabetes   Hypertension   Hyperlipidemia   Hip Problem    Patient says he is having R side Hip Pain and says it has happened in the past and now he is noticing the pain getting worse. Patient says he will try back and body cream to help and says after about a half of day it wears off.     HPI  Hip pain  Onset: gradual  Duration: months  Location: right hip along lateral side  Radiation: down the lateral side of left thigh Pain level and character: 5-6/10 sore  Other associated symptoms: he denies numbness or weakness, tingling in lower extremity  Interventions: Salve of some kind, BC powder  Alleviating: warm shower  Aggravating: walking and riding in his truck      HYPERTENSION / HYPERLIPIDEMIA Satisfied with current treatment? yes Duration of hypertension: years BP monitoring frequency: a few times a week BP range: unsure  BP medication side effects: no Past BP meds: losartan (cozaar), metoprolol  Duration of hyperlipidemia: years Cholesterol medication side effects: no Cholesterol supplements: none Past cholesterol medications: pravastatin (pravachol)- not taking due to pain  Medication compliance: poor compliance Aspirin: yes Recent stressors: no Recurrent headaches: no Visual changes: no Palpitations: no Dyspnea: yes- especially with heat  Chest pain: no Lower extremity edema: no Dizzy/lightheaded: no  Diabetes, Type 2 - Last A1c 5.9 - Medications: Ozempic 1 mg weekly injection - Compliance: good  - Checking BG at home: checking daily in the AM , running 120s- 140s  - Eye exam: UTD - Foot exam: UTD - Microalbumin:  UTD - Statin: on weekly statin therapy  - PNA vaccine: Completed  - Denies symptoms of hypoglycemia, polyuria, polydipsia, numbness extremities, foot ulcers/trauma  DEPRESSION Reports he has not had his Celexa in several weeks States his wife has noticed some changes to his mood      07/23/2023    9:55 AM 04/22/2023    8:43 AM 01/22/2023   11:30 AM 10/21/2022    9:39 AM 07/19/2022   11:07 AM  Depression screen PHQ 2/9  Decreased Interest 2 1 1  0 1  Down, Depressed, Hopeless 0 0 0 0 1  PHQ - 2 Score 2 1 1  0 2  Altered sleeping 0 2 1 0 2  Tired, decreased energy 1 1 1 1 2   Change in appetite 0 2 1 1 1   Feeling bad or failure about yourself  0 0 0 0 1  Trouble concentrating 2 1 0 1 0  Moving slowly or fidgety/restless 1 0 1 1 0  Suicidal thoughts 0 0 0 0 0  PHQ-9 Score 6 7 5 4 8   Difficult doing work/chores Somewhat difficult Somewhat difficult Somewhat difficult Somewhat difficult Somewhat difficult      Patient Active Problem List   Diagnosis Date Noted   Advanced care planning/counseling discussion    Adenoma of transverse colon    CHF (congestive heart failure) (HCC)    Mixed hyperlipidemia 01/26/2021   Chronic midline low back pain with right-sided sciatica 01/26/2021   Aortic atherosclerosis (HCC)  10/24/2020   Morbid obesity (HCC) 10/01/2020   History of tobacco use 06/23/2020   Unstable angina (HCC) 05/19/2020   Lipoma of head 03/31/2020   Mild episode of recurrent major depressive disorder (HCC) 01/05/2016   Coronary artery disease 01/13/2015   Essential hypertension 01/13/2015   Left ventricular dysfunction 01/13/2015   Type 2 diabetes mellitus with diabetic neuropathy, unspecified (HCC) 01/13/2015   Anxiety disorder 12/20/2014   GERD (gastroesophageal reflux disease) 12/20/2014   Hyperlipidemia associated with type 2 diabetes mellitus (HCC) 12/20/2014    Past Surgical History:  Procedure Laterality Date   ANKLE FRACTURE SURGERY Right    CARDIAC CATHETERIZATION      CHOLECYSTECTOMY     COLONOSCOPY N/A 11/28/2021   Procedure: COLONOSCOPY;  Surgeon: Toney Reil, MD;  Location: [facility] ENDOSCOPY;  Service: Gastroenterology;  Laterality: N/A;   CORONARY STENT PLACEMENT  2015   3 stents   HAND SURGERY Right    LEFT HEART CATH AND CORONARY ANGIOGRAPHY Left 06/02/2020   Procedure: LEFT HEART CATH AND CORONARY ANGIOGRAPHY;  Surgeon: Laurier Nancy, MD;  Location: [facility] INVASIVE CV LAB;  Service: Cardiovascular;  Laterality: Left;    Family History  Problem Relation Age of Onset   CAD Mother    Diabetes Mother    Colon cancer Father    Breast cancer Paternal Grandmother    Stomach cancer Paternal Grandfather     Social History   Tobacco Use   Smoking status: Former    Current packs/day: 0.00    Types: Cigarettes    Quit date: 1990    Years since quitting: 34.6   Smokeless tobacco: Never  Substance Use Topics   Alcohol use: Not Currently     Current Outpatient Medications:    aspirin 81 MG EC tablet, Take 1 tablet by mouth daily., Disp: , Rfl:    Blood Glucose Monitoring Suppl (ONE TOUCH ULTRA 2) w/Device KIT, 1 each by Does not apply route daily., Disp: 1 kit, Rfl: 0   furosemide (LASIX) 20 MG tablet, Hold until followup with your outpatient doctor because your blood pressure was normal without it and you had acute kidney injury., Disp: 90 tablet, Rfl: 1   glucose blood (ONETOUCH ULTRA) test strip, 1 each by Other route daily. Use as instructed, Disp: 100 each, Rfl: 3   Lancets (ONETOUCH ULTRASOFT) lancets, Use as instructed, Disp: 100 each, Rfl: 12   nitroGLYCERIN (NITROSTAT) 0.4 MG SL tablet, Place 1 tablet (0.4 mg total) under the tongue every 5 (five) minutes as needed for Chest pain May take up to 3 doses., Disp: 25 tablet, Rfl: 9   buPROPion (WELLBUTRIN XL) 300 MG 24 hr tablet, Take 1 tablet (300 mg total) by mouth daily., Disp: 90 tablet, Rfl: 1   citalopram (CELEXA) 40 MG tablet, Take 1 tablet (40 mg total) by mouth daily., Disp:  90 tablet, Rfl: 1   clopidogrel (PLAVIX) 75 MG tablet, Take 1 tablet (75 mg total) by mouth at bedtime., Disp: 90 tablet, Rfl: 0   gabapentin (NEURONTIN) 300 MG capsule, Take 2 capsules (600 mg total) by mouth at bedtime as needed., Disp: 180 capsule, Rfl: 1   losartan (COZAAR) 50 MG tablet, Take 1 tablet (50 mg total) by mouth daily., Disp: 90 tablet, Rfl: 1   metoprolol succinate (TOPROL-XL) 50 MG 24 hr tablet, Take 1 tablet (50 mg total) by mouth daily., Disp: 90 tablet, Rfl: 1   omeprazole (PRILOSEC) 20 MG capsule, Take 1 capsule (20 mg total) by mouth daily.,  Disp: 90 capsule, Rfl: 1   pravastatin (PRAVACHOL) 80 MG tablet, Take 1 tablet (80 mg total) by mouth once a week. Take 1 tab once weekly, Disp: 24 tablet, Rfl: 0   Semaglutide, 1 MG/DOSE, (OZEMPIC, 1 MG/DOSE,) 4 MG/3ML SOPN, Inject 1 mg as directed once a week., Disp: 9 mL, Rfl: 0   traZODone (DESYREL) 50 MG tablet, TAKE 1 TABLET BY MOUTH ONCE DAILY AT BEDTIME AS NEEDED, Disp: 90 tablet, Rfl: 1  No Known Allergies  I personally reviewed active problem list, medication list, allergies, health maintenance, notes from last encounter, lab results with the patient/caregiver today.   ROS  See HPI for pertinent ROS    Objective  Vitals:   07/23/23 0940  BP: 99/65  Pulse: 63  SpO2: 96%  Weight: 248 lb 6.4 oz (112.7 kg)  Height: 5' 10.5" (1.791 m)    Body mass index is 35.14 kg/m.  Physical Exam Vitals reviewed.  Constitutional:      General: He is awake.     Appearance: Normal appearance. He is well-developed and well-groomed.  HENT:     Head: Normocephalic and atraumatic.  Cardiovascular:     Rate and Rhythm: Normal rate and regular rhythm.     Pulses: Normal pulses.          Radial pulses are 2+ on the right side and 2+ on the left side.     Heart sounds: Normal heart sounds. No murmur heard.    No friction rub. No gallop.  Pulmonary:     Effort: Pulmonary effort is normal.     Breath sounds: Normal breath  sounds. No decreased air movement. No decreased breath sounds, wheezing, rhonchi or rales.  Musculoskeletal:     Cervical back: Normal range of motion.     Right lower leg: No edema.     Left lower leg: No edema.  Neurological:     General: No focal deficit present.     Mental Status: He is alert and oriented to person, place, and time.     GCS: GCS eye subscore is 4. GCS verbal subscore is 5. GCS motor subscore is 6.     Cranial Nerves: No dysarthria or facial asymmetry.  Psychiatric:        Behavior: Behavior is cooperative.      Recent Results (from the past 2160 hour(s))  Lipid Profile     Status: Abnormal   Collection Time: 07/23/23 10:32 AM  Result Value Ref Range   Cholesterol, Total 207 (H) 100 - 199 mg/dL   Triglycerides 161 (H) 0 - 149 mg/dL   HDL 33 (L) >09 mg/dL   VLDL Cholesterol Cal 32 5 - 40 mg/dL   LDL Chol Calc (NIH) 604 (H) 0 - 99 mg/dL   Chol/HDL Ratio 6.3 (H) 0.0 - 5.0 ratio    Comment:                                   T. Chol/HDL Ratio                                             Men  Women  1/2 Avg.Risk  3.4    3.3                                   Avg.Risk  5.0    4.4                                2X Avg.Risk  9.6    7.1                                3X Avg.Risk 23.4   11.0   HgB A1c     Status: Abnormal   Collection Time: 07/23/23 10:32 AM  Result Value Ref Range   Hgb A1c MFr Bld 5.9 (H) 4.8 - 5.6 %    Comment:          Prediabetes: 5.7 - 6.4          Diabetes: >6.4          Glycemic control for adults with diabetes: <7.0    Est. average glucose Bld gHb Est-mCnc 123 mg/dL  Comp Met (CMET)     Status: Abnormal   Collection Time: 07/23/23 10:32 AM  Result Value Ref Range   Glucose 95 70 - 99 mg/dL   BUN 16 8 - 27 mg/dL   Creatinine, Ser 8.46 (H) 0.76 - 1.27 mg/dL   eGFR 60 >96 EX/BMW/4.13   BUN/Creatinine Ratio 12 10 - 24   Sodium 140 134 - 144 mmol/L   Potassium 5.0 3.5 - 5.2 mmol/L   Chloride 107 (H) 96 - 106  mmol/L   CO2 22 20 - 29 mmol/L   Calcium 9.1 8.6 - 10.2 mg/dL   Total Protein 6.6 6.0 - 8.5 g/dL   Albumin 4.3 3.9 - 4.9 g/dL   Globulin, Total 2.3 1.5 - 4.5 g/dL   Bilirubin Total 0.3 0.0 - 1.2 mg/dL   Alkaline Phosphatase 77 44 - 121 IU/L   AST 24 0 - 40 IU/L   ALT 15 0 - 44 IU/L     PHQ2/9:    07/23/2023    9:55 AM 04/22/2023    8:43 AM 01/22/2023   11:30 AM 10/21/2022    9:39 AM 07/19/2022   11:07 AM  Depression screen PHQ 2/9  Decreased Interest 2 1 1  0 1  Down, Depressed, Hopeless 0 0 0 0 1  PHQ - 2 Score 2 1 1  0 2  Altered sleeping 0 2 1 0 2  Tired, decreased energy 1 1 1 1 2   Change in appetite 0 2 1 1 1   Feeling bad or failure about yourself  0 0 0 0 1  Trouble concentrating 2 1 0 1 0  Moving slowly or fidgety/restless 1 0 1 1 0  Suicidal thoughts 0 0 0 0 0  PHQ-9 Score 6 7 5 4 8   Difficult doing work/chores Somewhat difficult Somewhat difficult Somewhat difficult Somewhat difficult Somewhat difficult      Fall Risk:    07/23/2023    9:55 AM 04/22/2023    8:43 AM 01/22/2023   11:30 AM 10/21/2022    9:39 AM 05/31/2022   11:42 AM  Fall Risk   Falls in the past year? 1 0 0 0 0  Number falls in past yr: 0 0 0 0 0  Injury with  Fall? 0 0 0 0 0  Risk for fall due to : No Fall Risks No Fall Risks No Fall Risks No Fall Risks No Fall Risks  Follow up Falls evaluation completed Falls evaluation completed Falls evaluation completed Falls evaluation completed Falls evaluation completed      Functional Status Survey:      Assessment & Plan  Problem List Items Addressed This Visit       Cardiovascular and Mediastinum   Coronary artery disease    Chronic, ongoing Currently followed by Cardiology- continue collaboration and will defer to their recommendations as needed Continue with Pravastatin 80 mg PO weekly for now- reviewed importance of resuming regimen for plaque stabilization and cholesterol management  Recheck lipid panel today for monitoring- results to dictate  further management Follow up in 3 months or sooner if concerns arise        Relevant Medications   pravastatin (PRAVACHOL) 80 MG tablet   clopidogrel (PLAVIX) 75 MG tablet   losartan (COZAAR) 50 MG tablet   metoprolol succinate (TOPROL-XL) 50 MG 24 hr tablet   Other Relevant Orders   Lipid Profile (Completed)   Essential hypertension - Primary    Chronic, historic condition Appears well controlled with current regimen comprised of Losartan 50 mg PO every day, Metoprolol 50 mg PO every day,  Continue current regimen Continue to check BP at home for monitoring  Follow up in 3 months or sooner if concerns arise        Relevant Medications   pravastatin (PRAVACHOL) 80 MG tablet   losartan (COZAAR) 50 MG tablet   metoprolol succinate (TOPROL-XL) 50 MG 24 hr tablet   Other Relevant Orders   Comp Met (CMET) (Completed)     Digestive   GERD (gastroesophageal reflux disease)    Chronic, controlled  He is taking Omeprazole 20 mg once per day, sometimes skips a day He reports he sometimes gets GI symptoms if he takes it more frequently so he takes as needed right now Will change to Omeprazole 20 mg PO every day  Refills provided Follow up in 6 months or sooner if concerns arise        Relevant Medications   omeprazole (PRILOSEC) 20 MG capsule     Endocrine   Hyperlipidemia associated with type 2 diabetes mellitus (HCC)    Chronic, historic condition Currently prescribed  Pravastatin 80 mg PO weekly but reports he has not been taking due to pain Reviewed importance of this for managing cholesterol especially in setting of CAD May need to switch to another agent for control if he is not able to maintain compliance Recheck lipid panel today- results to dictate further management Follow up in 3 months or sooner if concerns arise        Relevant Medications   pravastatin (PRAVACHOL) 80 MG tablet   losartan (COZAAR) 50 MG tablet   metoprolol succinate (TOPROL-XL) 50 MG 24 hr  tablet   Semaglutide, 1 MG/DOSE, (OZEMPIC, 1 MG/DOSE,) 4 MG/3ML SOPN   Other Relevant Orders   Lipid Profile (Completed)   Type 2 diabetes mellitus with diabetic neuropathy, unspecified (HCC)    Chronic, historic condition Previous A1c was 5.9  Recheck today Currently taking Ozempic 1 mg weekly injection and appears to be tolerating well  Continue current regimen  Follow up in 3 months or sooner if concerns arise        Relevant Medications   pravastatin (PRAVACHOL) 80 MG tablet   gabapentin (NEURONTIN) 300 MG capsule  losartan (COZAAR) 50 MG tablet   Semaglutide, 1 MG/DOSE, (OZEMPIC, 1 MG/DOSE,) 4 MG/3ML SOPN   Other Relevant Orders   HgB A1c (Completed)   Comp Met (CMET) (Completed)     Other   Mild episode of recurrent major depressive disorder (HCC)    Chronic, historic condition He reports running out of Celexa several weeks ago and there has been noted change to mood Will send in refills of Celexa 40 mg PO every day and Bupropion 300 mg PO every day to resume regimen Follow up in about 3 months or sooner if concerns arise       Relevant Medications   citalopram (CELEXA) 40 MG tablet   buPROPion (WELLBUTRIN XL) 300 MG 24 hr tablet   Other Visit Diagnoses     Acute right hip pain     Acute, new concern Patient reports pain of lateral right hip along bursa  He has previous hx of right lower back pain with sciatica so cannot rule this out as potential exacerbation of this We discussed bursitis and potential for steroid injection to assist with inflammation  Will review labs and schedule if appropriate based on A1c and CMP Return in about 2-3 weeks for injection if appropriate.  For now recommend Tylenol, Rest, stretches, and warm compresses to area for management             Return in about 3 months (around 10/23/2023) for HTN, Diabetes follow up, HLD.   I,  E , PA-C, have reviewed all documentation for this visit. The documentation on 07/25/23 for  the exam, diagnosis, procedures, and orders are all accurate and complete.   Jacquelin Hawking, MHS, PA-C [facility]

## 2023-07-23 NOTE — Patient Instructions (Addendum)
  I recommend the following at this time to help relieve that discomfort:  Rest Warm compresses to the area (20 minutes on, minimum of 30 minutes off) You can alternate Tylenol and Ibuprofen for pain management but Ibuprofen is typically preferred to reduce inflammation.  Tylenol is preferred due to your kidney function Gentle stretches and exercises that I have included in your paperwork Try to reduce excess strain to the area and rest as much as possible  Wear supportive shoes and, if you must lift anything, use proper lifting techniques that spare your back.   If these measures do not lead to improvement in your symptoms over the next 2-4 weeks please let us know

## 2023-07-25 NOTE — Assessment & Plan Note (Signed)
Chronic, historic condition Currently prescribed  Pravastatin 80 mg PO weekly but reports he has not been taking due to pain Reviewed importance of this for managing cholesterol especially in setting of CAD May need to switch to another agent for control if he is not able to maintain compliance Recheck lipid panel today- results to dictate further management Follow up in 3 months or sooner if concerns arise

## 2023-07-25 NOTE — Assessment & Plan Note (Addendum)
Chronic, historic condition Appears well controlled with current regimen comprised of Losartan 50 mg PO every day, Metoprolol 50 mg PO every day,  Continue current regimen Continue to check BP at home for monitoring  Follow up in 3 months or sooner if concerns arise

## 2023-07-25 NOTE — Assessment & Plan Note (Signed)
Chronic, historic condition Previous A1c was 5.9  Recheck today Currently taking Ozempic 1 mg weekly injection and appears to be tolerating well  Continue current regimen  Follow up in 3 months or sooner if concerns arise

## 2023-07-25 NOTE — Assessment & Plan Note (Signed)
Chronic, historic condition He reports running out of Celexa several weeks ago and there has been noted change to mood Will send in refills of Celexa 40 mg PO every day and Bupropion 300 mg PO every day to resume regimen Follow up in about 3 months or sooner if concerns arise

## 2023-07-25 NOTE — Assessment & Plan Note (Addendum)
Chronic, ongoing Currently followed by Cardiology- continue collaboration and will defer to their recommendations as needed Continue with Pravastatin 80 mg PO weekly for now- reviewed importance of resuming regimen for plaque stabilization and cholesterol management  Recheck lipid panel today for monitoring- results to dictate further management Follow up in 3 months or sooner if concerns arise

## 2023-07-30 NOTE — Progress Notes (Signed)
Your electrolytes, liver and kidney function are overall normal  Your A1c is staying stable at 5.9 Your cholesterol is elevated - At this time it may be prudent to take your statin more than once per week. We can try a lower dose every other day if this would be better for you in terms of side effects. Otherwise we may need to explore other cholesterol medications for better control. Let us know how you would prefer to proceed.

## 2023-08-28 ENCOUNTER — Other Ambulatory Visit: Payer: Self-pay | Admitting: Nurse Practitioner

## 2023-08-28 ENCOUNTER — Other Ambulatory Visit: Payer: Self-pay

## 2023-08-28 DIAGNOSIS — R600 Localized edema: Secondary | ICD-10-CM

## 2023-08-28 MED ORDER — CLOPIDOGREL BISULFATE 75 MG PO TABS
75.0000 mg | ORAL_TABLET | Freq: Every day | ORAL | 11 refills | Status: DC
  Filled 2023-08-28: qty 30, 30d supply, fill #0

## 2023-08-28 NOTE — Telephone Encounter (Signed)
Medication Refill - Medication: furosemide (LASIX) 20 MG tablet   Has the patient contacted their pharmacy? Yes.   (Agent: If no, request that the patient contact the pharmacy for the refill. If patient does not wish to contact the pharmacy document the reason why and proceed with request.) (Agent: If yes, when and what did the pharmacy advise?)  Preferred Pharmacy (with phone number or street name): [id number] - [place], [place] - [address] AT [address] OF [address]  Has the patient been seen for an appointment in the last year OR does the patient have an upcoming appointment? Yes.    Agent: Please be advised that RX refills may take up to 3 business days. We ask that you follow-up with your pharmacy.

## 2023-08-28 NOTE — Telephone Encounter (Signed)
Requested medication (s) are due for refill today: expired medication   Requested medication (s) are on the active medication list: yes   Last refill:  5//5/23 #901 refiklls  Future visit scheduled: yes   Notes to clinic:  expired medication do you want to refill Rx?     Requested Prescriptions  Pending Prescriptions Disp Refills   furosemide (LASIX) 20 MG tablet 90 tablet 1    Sig: Hold until followup with your outpatient doctor because your blood pressure was normal without it and you had acute kidney injury.     Cardiovascular:  Diuretics - Loop Failed - 08/28/2023  1:06 PM      Failed - Cr in normal range and within 180 days    Creatinine  Date Value Ref Range Status  07/15/2014 1.07 0.60 - 1.30 mg/dL Final   Creatinine, Ser  Date Value Ref Range Status  07/23/2023 1.31 (H) 0.76 - 1.27 mg/dL Final         Failed - Cl in normal range and within 180 days    Chloride  Date Value Ref Range Status  07/23/2023 107 (H) 96 - 106 mmol/L Final  07/15/2014 111 (H) 98 - 107 mmol/L Final         Failed - Mg Level in normal range and within 180 days    Magnesium  Date Value Ref Range Status  10/19/2020 2.2 1.7 - 2.4 mg/dL Final    Comment:    Performed at [facility], [address] [place], [place] [address]         Passed - K in normal range and within 180 days    Potassium  Date Value Ref Range Status  07/23/2023 5.0 3.5 - 5.2 mmol/L Final  07/15/2014 4.4 3.5 - 5.1 mmol/L Final         Passed - Ca in normal range and within 180 days    Calcium  Date Value Ref Range Status  07/23/2023 9.1 8.6 - 10.2 mg/dL Final   Calcium, Total  Date Value Ref Range Status  07/15/2014 8.3 (L) 8.5 - 10.1 mg/dL Final         Passed - Na in normal range and within 180 days    Sodium  Date Value Ref Range Status  07/23/2023 140 134 - 144 mmol/L Final  07/15/2014 144 136 - 145 mmol/L Final         Passed - Last BP in normal range    BP Readings from Last 1  Encounters:  07/23/23 99/65         Passed - Valid encounter within last 6 months    Recent Outpatient Visits           1 month ago Essential hypertension   [place] [facility] Mecum, Oswaldo Conroy, PA-C   4 months ago [facility] [employer] preventive visit   [place] [facility] [place], Clydie Braun, NP   7 months ago Type 2 diabetes mellitus with diabetic neuropathy, without long-term current use of insulin (HCC)   [place] [facility] Larae Grooms, NP   10 months ago Annual physical exam   [facility] Larae Grooms, NP   1 year ago Unstable angina [facility])   [place] [facility] Larae Grooms, NP       Future Appointments             In 1 month Larae Grooms, NP  [facility]

## 2023-08-29 ENCOUNTER — Other Ambulatory Visit: Payer: Self-pay

## 2023-09-06 ENCOUNTER — Emergency Department: Payer: Medicare Other

## 2023-09-06 ENCOUNTER — Other Ambulatory Visit: Payer: Self-pay

## 2023-09-06 ENCOUNTER — Emergency Department
Admission: EM | Admit: 2023-09-06 | Discharge: 2023-09-06 | Disposition: A | Discharge status: Home / Self Care | Payer: Medicare Other | Attending: Emergency Medicine | Admitting: Emergency Medicine

## 2023-09-06 DIAGNOSIS — M79605 Pain in left leg: Secondary | ICD-10-CM | POA: Diagnosis not present

## 2023-09-06 DIAGNOSIS — R9431 Abnormal electrocardiogram [ECG] [EKG]: Secondary | ICD-10-CM | POA: Diagnosis not present

## 2023-09-06 DIAGNOSIS — Z91148 Patient's other noncompliance with medication regimen for other reason: Secondary | ICD-10-CM | POA: Insufficient documentation

## 2023-09-06 DIAGNOSIS — M79662 Pain in left lower leg: Secondary | ICD-10-CM | POA: Diagnosis not present

## 2023-09-06 DIAGNOSIS — Z7902 Long term (current) use of antithrombotics/antiplatelets: Secondary | ICD-10-CM | POA: Diagnosis not present

## 2023-09-06 DIAGNOSIS — M7989 Other specified soft tissue disorders: Secondary | ICD-10-CM | POA: Diagnosis not present

## 2023-09-06 DIAGNOSIS — R0789 Other chest pain: Secondary | ICD-10-CM | POA: Diagnosis not present

## 2023-09-06 LAB — CBC
HCT: 39.8 % (ref 39.0–52.0)
Hemoglobin: 13.4 g/dL (ref 13.0–17.0)
MCH: 26.5 pg (ref 26.0–34.0)
MCHC: 33.7 g/dL (ref 30.0–36.0)
MCV: 78.8 fL — ABNORMAL LOW (ref 80.0–100.0)
Platelets: 147 10*3/uL — ABNORMAL LOW (ref 150–400)
RBC: 5.05 MIL/uL (ref 4.22–5.81)
RDW: 13.6 % (ref 11.5–15.5)
WBC: 7.8 10*3/uL (ref 4.0–10.5)
nRBC: 0 % (ref 0.0–0.2)

## 2023-09-06 LAB — PROTIME-INR
INR: 1.1 (ref 0.8–1.2)
Prothrombin Time: 13.9 seconds (ref 11.4–15.2)

## 2023-09-06 LAB — BASIC METABOLIC PANEL
Anion gap: 10 (ref 5–15)
BUN: 15 mg/dL (ref 8–23)
CO2: 25 mmol/L (ref 22–32)
Calcium: 8.9 mg/dL (ref 8.9–10.3)
Chloride: 103 mmol/L (ref 98–111)
Creatinine, Ser: 1.46 mg/dL — ABNORMAL HIGH (ref 0.61–1.24)
GFR, Estimated: 53 mL/min — ABNORMAL LOW (ref >60)
Glucose, Bld: 128 mg/dL — ABNORMAL HIGH (ref 70–99)
Potassium: 3.9 mmol/L (ref 3.5–5.1)
Sodium: 138 mmol/L (ref 135–145)

## 2023-09-06 LAB — TROPONIN I (HIGH SENSITIVITY): Troponin I (High Sensitivity): 3 ng/L (ref <18)

## 2023-09-06 NOTE — Discharge Instructions (Signed)
You were seen in the emergency department today for your intermittent left leg pain.  No evidence of a blood clot in your leg at this time.  Other laboratory workup was reassuring.  You can take Tylenol over-the-counter to help with pain symptoms.  Otherwise follow-up with your primary care provider

## 2023-09-06 NOTE — ED Triage Notes (Addendum)
Pt to ED c/o left calf swelling x 1.5 weeks. Pt reports painful. No redness noted. No known injury. Pt takes Plavix. Pt concerned for DVT  Pt also reports cp and SHOB x months, intermittent in nature.

## 2023-09-06 NOTE — ED Provider Notes (Signed)
[facility] Provider Note    Event Date/Time   First MD Initiated Contact with Patient 09/06/23 2217     (approximate)   History   Leg Swelling (Left, ) and Chest Pain   HPI Steven Vang is a 67 y.o. male presenting today for left leg swelling.  Over the past 1.5 weeks patient has noticed swelling, pain, and cramping in his left lower extremity that has not been present on the right side.  His wife noticed that it felt warmer today and they were concerned for possible blood clot.  He has not had difficulty ambulating on it.  They do note that he is typically on Plavix and ran out of it and was out of it for several weeks before restarting over the past couple of days.  No prior history of blood clots.  No recent long travel.  No recent surgeries.  Denies any fevers, chills, shortness of breath, chest pain.     Physical Exam   Triage Vital Signs: ED Triage Vitals  Encounter Vitals Group     BP 09/06/23 2108 134/74     Systolic BP Percentile --      Diastolic BP Percentile --      Pulse Rate 09/06/23 2108 70     Resp 09/06/23 2108 14     Temp 09/06/23 2108 98.1 F (36.7 C)     Temp Source 09/06/23 2108 Oral     SpO2 09/06/23 2108 95 %     Weight 09/06/23 2109 240 lb (108.9 kg)     Height 09/06/23 2109 5\' 10"  (1.778 m)     Head Circumference --      Peak Flow --      Pain Score 09/06/23 2108 7     Pain Loc --      Pain Education --      Exclude from Growth Chart --     Most recent vital signs: Vitals:   09/06/23 2108 09/06/23 2334  BP: 134/74 (!) 150/74  Pulse: 70 64  Resp: 14 15  Temp: 98.1 F (36.7 C)   SpO2: 95% 95%   I have reviewed the vital signs. General:  Awake, alert, no acute distress. Head:  Normocephalic, Atraumatic. EENT:  PERRL, EOMI, Oral mucosa pink and moist, Neck is supple. Cardiovascular: Regular rate, 2+ distal pulses. Respiratory:  Normal respiratory effort, symmetrical expansion, no distress.   Extremities:   Moving all four extremities through full ROM without pain.  Mild tenderness palpation to left calf.  Overall no significant swelling when compared to the right side.  Mild warmth noted.  No erythema present. Neuro:  Alert and oriented.  Interacting appropriately.   Skin:  Warm, dry, no rash.   Psych: Appropriate affect.    ED Results / Procedures / Treatments   Labs (all labs ordered are listed, but only abnormal results are displayed) Labs Reviewed  BASIC METABOLIC PANEL - Abnormal; Notable for the following components:      Result Value   Glucose, Bld 128 (*)    Creatinine, Ser 1.46 (*)    GFR, Estimated 53 (*)    All other components within normal limits  CBC - Abnormal; Notable for the following components:   MCV 78.8 (*)    Platelets 147 (*)    All other components within normal limits  PROTIME-INR  TROPONIN I (HIGH SENSITIVITY)     EKG My EKG interpretation: Rate of 68, normal sinus rhythm, normal axis, normal intervals.  No acute ST elevations or depressions   RADIOLOGY Independently interpreted duplex left lower extremity with no DVT   PROCEDURES:  Critical Care performed: No  Procedures   MEDICATIONS ORDERED IN ED: Medications - No data to display   IMPRESSION / MDM / ASSESSMENT AND PLAN / ED COURSE  I reviewed the triage vital signs and the nursing notes.                              Differential diagnosis includes, but is not limited to, DVT, cellulitis, muscular cramping.  Patient's presentation is most consistent with acute complicated illness / injury requiring diagnostic workup.  Patient is a 67 year old male presenting today for intermittent left lower leg pain.  Exam shows no obvious swelling or erythema or warmth.  No evidence of cellulitis.  Duplex of left lower extremity was ordered and showed no evidence of DVT.  Suspect at this time intermittent pain symptoms are more likely due to cramping or muscular injury.  Patient otherwise stable and  asymptomatic here in the emergency department.  Safe for discharge and follow-up with primary care provider.  Clinical Course as of 09/08/23 1112  Sat Sep 06, 2023  2220 Basic metabolic panel(!) Creatinine largely comparable with baseline [DW]  2220 Troponin I (High Sensitivity): 3 [DW]  2220 CBC(!) Unremarkable [DW]  2315 US Venous Img Lower Unilateral Left No evidence of DVT in the left lower extremity [DW]    Clinical Course User Index [DW] Janith Lima, MD     FINAL CLINICAL IMPRESSION(S) / ED DIAGNOSES   Final diagnoses:  Left leg pain     Rx / DC Orders   ED Discharge Orders     None        Note:  This document was prepared using Dragon voice recognition software and may include unintentional dictation errors.   Janith Lima, MD 09/08/23 [phone]

## 2023-09-08 DIAGNOSIS — Z7902 Long term (current) use of antithrombotics/antiplatelets: Secondary | ICD-10-CM | POA: Diagnosis not present

## 2023-09-08 DIAGNOSIS — F3342 Major depressive disorder, recurrent, in full remission: Secondary | ICD-10-CM | POA: Diagnosis not present

## 2023-09-09 ENCOUNTER — Other Ambulatory Visit: Payer: Self-pay

## 2023-09-09 ENCOUNTER — Telehealth: Payer: Self-pay

## 2023-09-09 NOTE — Transitions of Care (Post Inpatient/ED Visit) (Signed)
09/09/2023  Name: Steven Vang Student MRN: [id number] DOB: 14-Nov-1956  Today's TOC FU Call Status: Today's TOC FU Call Status:: Successful TOC FU Call Completed TOC FU Call Complete Date: 09/09/23 Patient's Name and Date of Birth confirmed.  Transition Care Management Follow-up Telephone Call Date of Discharge: 09/06/23 Discharge Facility: [facility]) Type of Discharge: Emergency Department How have you been since you were released from the hospital?: Better  Items Reviewed: Did you receive and understand the discharge instructions provided?: Yes Medications obtained,verified, and reconciled?: Yes (Medications Reviewed) Any new allergies since your discharge?: No Dietary orders reviewed?: No Do you have support at home?: Yes People in Home: spouse  Medications Reviewed Today: Medications Reviewed Today     Reviewed by Pablo Ledger, CMA (Certified Medical Assistant) on 09/09/23 at 1604  Med List Status: <None>   Medication Order Taking? Sig Documenting Provider Last Dose Status Informant  aspirin 81 MG EC tablet [id number] No Take 1 tablet by mouth daily.  Patient not taking: Reported on 09/09/2023   [provider] Not Taking Active Spouse/Significant Other  Blood Glucose Monitoring Suppl (ONE TOUCH ULTRA 2) w/Device KIT [id number] Yes 1 each by Does not apply route daily. Larae Grooms, NP Taking Active   buPROPion (WELLBUTRIN XL) 300 MG 24 hr tablet [id number] Yes Take 1 tablet (300 mg total) by mouth daily. Mecum, Erin E, PA-C Taking Active   citalopram (CELEXA) 40 MG tablet [id number] Yes Take 1 tablet (40 mg total) by mouth daily. Mecum, Erin E, PA-C Taking Active   clopidogrel (PLAVIX) 75 MG tablet [id number] Yes Take 1 tablet (75 mg total) by mouth daily.  Taking Active   furosemide (LASIX) 20 MG tablet [id number] Yes Hold until followup with your outpatient doctor because your blood pressure was normal without it and you had acute  kidney injury. Larae Grooms, NP Taking Active   gabapentin (NEURONTIN) 300 MG capsule [id number] Yes Take 2 capsules (600 mg total) by mouth at bedtime as needed. Mecum, Erin E, PA-C Taking Active   glucose blood (ONETOUCH ULTRA) test strip [id number] Yes 1 each by Other route daily. Use as instructed Larae Grooms, NP Taking Active   Lancets [facility] ULTRASOFT) lancets [id number] Yes Use as instructed Larae Grooms, NP Taking Active   losartan (COZAAR) 50 MG tablet [id number] Yes Take 1 tablet (50 mg total) by mouth daily. Mecum, Erin E, PA-C Taking Active   metoprolol succinate (TOPROL-XL) 50 MG 24 hr tablet [id number] Yes Take 1 tablet (50 mg total) by mouth daily. Mecum, Erin E, PA-C Taking Active   nitroGLYCERIN (NITROSTAT) 0.4 MG SL tablet [id number] Yes Place 1 tablet (0.4 mg total) under the tongue every 5 (five) minutes as needed for Chest pain May take up to 3 doses. Larae Grooms, NP Taking Active   omeprazole (PRILOSEC) 20 MG capsule [id number] Yes Take 1 capsule (20 mg total) by mouth daily. Mecum, Oswaldo Conroy, PA-C Taking Active   pravastatin (PRAVACHOL) 80 MG tablet [id number] Yes Take 1 tablet (80 mg total) by mouth once a week. Take 1 tab once weekly Mecum, Erin E, PA-C Taking Active   Semaglutide, 1 MG/DOSE, (OZEMPIC, 1 MG/DOSE,) 4 MG/3ML SOPN [id number] Yes Inject 1 mg as directed once a week. Mecum, Oswaldo Conroy, PA-C Taking Active   traZODone (DESYREL) 50 MG tablet [id number]  TAKE 1 TABLET BY MOUTH ONCE DAILY AT BEDTIME AS NEEDED Larae Grooms, NP  Expired 04/19/23 2359  Home Care and Equipment/Supplies: Were Home Health Services Ordered?: No Any new equipment or medical supplies ordered?: No  Functional Questionnaire: Do you need assistance with bathing/showering or dressing?: No Do you need assistance with meal preparation?: No Do you need assistance with eating?: No Do you have difficulty maintaining continence: No Do you need assistance with getting  out of bed/getting out of a chair/moving?: No Do you have difficulty managing or taking your medications?: No  Follow up appointments reviewed: PCP Follow-up appointment confirmed?: Yes Date of PCP follow-up appointment?: 09/15/23 Follow-up Provider: Prescott Gum, NP Specialist Hospital Follow-up appointment confirmed?: No Do you need transportation to your follow-up appointment?: No Do you understand care options if your condition(s) worsen?: Yes-patient verbalized understanding    SIGNATURE: Wilhemena Durie, CMA

## 2023-09-15 ENCOUNTER — Ambulatory Visit (INDEPENDENT_AMBULATORY_CARE_PROVIDER_SITE_OTHER): Payer: Medicare Other | Admitting: Family Medicine

## 2023-09-15 VITALS — BP 102/57 | HR 63 | Temp 98.3°F | Ht 71.26 in | Wt 239.4 lb

## 2023-09-15 DIAGNOSIS — E1140 Type 2 diabetes mellitus with diabetic neuropathy, unspecified: Secondary | ICD-10-CM | POA: Diagnosis not present

## 2023-09-15 DIAGNOSIS — R252 Cramp and spasm: Secondary | ICD-10-CM

## 2023-09-15 DIAGNOSIS — Z7985 Long-term (current) use of injectable non-insulin antidiabetic drugs: Secondary | ICD-10-CM

## 2023-09-15 MED ORDER — ONETOUCH ULTRA VI STRP: 1.0000 | ORAL_STRIP | Freq: Every day | 3 refills | Status: AC

## 2023-09-15 NOTE — Progress Notes (Unsigned)
/  BP (!) 102/57   Pulse 63   Temp 98.3 F (36.8 C) (Oral)   Ht 5' 11.26" (1.81 m)   Wt 239 lb 6.4 oz (108.6 kg)   SpO2 95%   BMI 33.15 kg/m    Subjective:    Patient ID: Steven Vang, male    DOB: 11/10/1956, 67 y.o.   MRN: [id number]  HPI: Steven Vang is a 67 y.o. male  Chief Complaint  Patient presents with   Diabetes   Follow-up   Patient states he may have taken his BP medication twice today; he cannot remember, could be cause for low B Patient is here for ED follow up, was present in ED on 09/06/2023 for 1 day for left leg swelling, pain, and cramping along with chest pain. Independently interpreted duplex left lower extremity with no DVT, ED suggested symptoms likely due to cramping or muscular injury. CBC, BMP, troponin, protim-INR obtained.  He complains of left lower extremity aching 10/10 that is constant, denies it waking him up at night, states mustard helps with his cramping. He describes the cramping to occur to the extent where he cannot walk, states this occurred before when he use to work out. He describes it as a nagging pain.  He complains of falling yesterday on his right leg and obtained a bruise with soreness 2-3/10. Dont know what happened with ozempic, wellbutrin (reach out to pharmacy).   DIABETES Previous A1c 5.9% one month ago. He is taking Ozempic 1 MG weekly, weight is down 9lbs. Diet is okay.  Hypoglycemic episodes:no Polydipsia/polyuria: yes polydipsia Visual disturbance: no Chest pain: no Paresthesias: no Glucose Monitoring: yes  Accucheck frequency:  Every 3 days  Fasting glucose:130-134 Taking Insulin?: no Blood Pressure Monitoring: a few times a week 138/60, 138/75, 160/40 Retinal Examination: Up to Date Foot Exam: Up to Date Diabetic Education: Completed Pneumovax: Up to Date last given November 2023 Influenza:  Can get today Aspirin: no    Relevant past medical, surgical, family and social history reviewed and updated as  indicated. Interim medical history since our last visit reviewed. Allergies and medications reviewed and updated.  Review of Systems  Constitutional:  Negative for fever.  Respiratory: Negative.    Cardiovascular: Negative.   Musculoskeletal:  Positive for arthralgias and myalgias.    Per HPI unless specifically indicated above     Objective:    BP (!) 102/57   Pulse 63   Temp 98.3 F (36.8 C) (Oral)   Ht 5' 11.26" (1.81 m)   Wt 239 lb 6.4 oz (108.6 kg)   SpO2 95%   BMI 33.15 kg/m   Wt Readings from Last 3 Encounters:  09/15/23 239 lb 6.4 oz (108.6 kg)  09/06/23 240 lb (108.9 kg)  07/23/23 248 lb 6.4 oz (112.7 kg)    Physical Exam Vitals and nursing note reviewed.  Constitutional:      General: He is awake. He is not in acute distress.    Appearance: Normal appearance. He is well-developed and well-groomed. He is obese. He is not ill-appearing.  HENT:     Head: Normocephalic and atraumatic.     Right Ear: Hearing and external ear normal. No drainage.     Left Ear: Hearing and external ear normal. No drainage.     Nose: Nose normal.  Eyes:     General: Lids are normal.        Right eye: No discharge.        Left eye:  No discharge.     Conjunctiva/sclera: Conjunctivae normal.  Cardiovascular:     Rate and Rhythm: Normal rate and regular rhythm.     Pulses:          Radial pulses are 2+ on the right side and 2+ on the left side.       Posterior tibial pulses are 2+ on the right side and 2+ on the left side.     Heart sounds: Normal heart sounds, S1 normal and S2 normal. No murmur heard.    No gallop.  Pulmonary:     Effort: Pulmonary effort is normal. No accessory muscle usage or respiratory distress.     Breath sounds: Normal breath sounds.  Musculoskeletal:        General: Normal range of motion.     Cervical back: Full passive range of motion without pain and normal range of motion.     Right lower leg: No edema.     Left lower leg: No edema.  Skin:     General: Skin is warm and dry.     Capillary Refill: Capillary refill takes less than 2 seconds.  Neurological:     Mental Status: He is alert and oriented to person, place, and time.  Psychiatric:        Attention and Perception: Attention normal.        Mood and Affect: Mood normal.        Speech: Speech normal.        Behavior: Behavior normal. Behavior is cooperative.        Thought Content: Thought content normal.     Results for orders placed or performed during the hospital encounter of 09/06/23  Basic metabolic panel  Result Value Ref Range   Sodium 138 135 - 145 mmol/L   Potassium 3.9 3.5 - 5.1 mmol/L   Chloride 103 98 - 111 mmol/L   CO2 25 22 - 32 mmol/L   Glucose, Bld 128 (H) 70 - 99 mg/dL   BUN 15 8 - 23 mg/dL   Creatinine, Ser 8.65 (H) 0.61 - 1.24 mg/dL   Calcium 8.9 8.9 - 78.4 mg/dL   GFR, Estimated 53 (L) >60 mL/min   Anion gap 10 5 - 15  CBC  Result Value Ref Range   WBC 7.8 4.0 - 10.5 K/uL   RBC 5.05 4.22 - 5.81 MIL/uL   Hemoglobin 13.4 13.0 - 17.0 g/dL   HCT 69.6 29.5 - 28.4 %   MCV 78.8 (L) 80.0 - 100.0 fL   MCH 26.5 26.0 - 34.0 pg   MCHC 33.7 30.0 - 36.0 g/dL   RDW 13.2 44.0 - 10.2 %   Platelets 147 (L) 150 - 400 K/uL   nRBC 0.0 0.0 - 0.2 %  Protime-INR  Result Value Ref Range   Prothrombin Time 13.9 11.4 - 15.2 seconds   INR 1.1 0.8 - 1.2  Troponin I (High Sensitivity)  Result Value Ref Range   Troponin I (High Sensitivity) 3 <18 ng/L      Assessment & Plan:   Problem List Items Addressed This Visit     Type 2 diabetes mellitus with diabetic neuropathy, unspecified (HCC) - Primary   Relevant Orders   Comp Met (CMET)   Other Visit Diagnoses     Cramp in lower leg            Follow up plan: No follow-ups on file.

## 2023-09-15 NOTE — Patient Instructions (Addendum)
Try OTC Magnesium 400 MG daily for muscle cramping, try heat to the area for pain relief.   Try stretching legs and exercises such as dorsiflexion and plantar flexion  Practice 150 minutes of physical activity weekly.

## 2023-09-16 LAB — COMPREHENSIVE METABOLIC PANEL
ALT: 14 [IU]/L (ref 0–44)
AST: 27 [IU]/L (ref 0–40)
Albumin: 4.1 g/dL (ref 3.9–4.9)
Alkaline Phosphatase: 79 [IU]/L (ref 44–121)
BUN/Creatinine Ratio: 10 (ref 10–24)
BUN: 16 mg/dL (ref 8–27)
Bilirubin Total: 0.3 mg/dL (ref 0.0–1.2)
CO2: 22 mmol/L (ref 20–29)
Calcium: 9.3 mg/dL (ref 8.6–10.2)
Chloride: 107 mmol/L — ABNORMAL HIGH (ref 96–106)
Creatinine, Ser: 1.62 mg/dL — ABNORMAL HIGH (ref 0.76–1.27)
Globulin, Total: 2.3 g/dL (ref 1.5–4.5)
Glucose: 94 mg/dL (ref 70–99)
Potassium: 4.8 mmol/L (ref 3.5–5.2)
Sodium: 142 mmol/L (ref 134–144)
Total Protein: 6.4 g/dL (ref 6.0–8.5)
eGFR: 47 mL/min/{1.73_m2} — ABNORMAL LOW (ref >59)

## 2023-09-17 DIAGNOSIS — R252 Cramp and spasm: Secondary | ICD-10-CM | POA: Insufficient documentation

## 2023-09-17 MED ORDER — OZEMPIC (1 MG/DOSE) 4 MG/3ML ~~LOC~~ SOPN
1.0000 mg | PEN_INJECTOR | SUBCUTANEOUS | 0 refills | Status: DC
Start: 2023-09-17 — End: 2023-10-23

## 2023-09-17 NOTE — Assessment & Plan Note (Signed)
Acute, left, ongoing. Recommend OTC Magnesium 400MG  daily, stretching exercises of lower extremities, and 150 mins of Physical acitivity weekly. Can also try Voltaren gel, lidocaine patches or otc ibuprofen or Aleve for pain relief.

## 2023-09-17 NOTE — Assessment & Plan Note (Addendum)
Chronic, stable Previous A1c was 5.9  Recheck today Currently taking Ozempic 1 mg weekly injection and appears to be tolerating well  Continue current regimen. Refills given Follow up in 6 months or sooner if concerns arise

## 2023-09-22 MED ORDER — DAPAGLIFLOZIN PROPANEDIOL 10 MG PO TABS: 10.0000 mg | ORAL_TABLET | Freq: Every day | ORAL | 2 refills | Status: AC

## 2023-09-22 NOTE — Addendum Note (Signed)
Addended by: Prescott Gum on: 09/22/2023 12:16 PM   Modules accepted: Orders

## 2023-09-22 NOTE — Progress Notes (Signed)
Hi Steven Vang, your electrolytes and liver function have returned as normal. Your kidney function levels have decreased, it is recommended to start a medication called Marcelline Deist, this medication is used to provide kidney protection for diabetics. I am sending it over to your pharmacy for you to start. Thank you for allowing me to participate in your care.

## 2023-10-22 NOTE — Progress Notes (Unsigned)
There were no vitals taken for this visit.   Subjective:    Patient ID: Steven Vang, male    DOB: 18-Nov-1956, 67 y.o.   MRN: [id number]  HPI: Steven Vang is a 67 y.o. male  No chief complaint on file.   HYPERLIPIDEMIA-  Does not have an appt with Cardiology. tolerating the pravastatin 1x a week Hyperlipidemia status: good compliance Satisfied with current treatment?  yes Side effects:  not on this dose Medication compliance: good compliance Aspirin:  no The 10-year ASCVD risk score (Arnett DK, et al., 2019) is: 25.1%   Values used to calculate the score:     Age: 34 years     Sex: Male     Is Non-Hispanic African American: No     Diabetic: Yes     Tobacco smoker: No     Systolic Blood Pressure: 102 mmHg     Is BP treated: Yes     HDL Cholesterol: 33 mg/dL     Total Cholesterol: 207 mg/dL Chest pain:  no Coronary artery disease:  yes  DEPRESSION Patient states he feels like the increased dose of Wellbutrin is going alright.  He feels like his motivation has been better.   Mood status: better Satisfied with current treatment?: no Symptom severity: mild  Duration of current treatment :  about a month Side effects: no Medication compliance: good compliance Psychotherapy/counseling: no  Previous psychiatric medications: celexa, wellbutrin Depressed mood: yes Anxious mood: yes Anhedonia: no Significant weight loss or gain: yes Insomnia: no  Fatigue: yes Feelings of worthlessness or guilt: no Impaired concentration/indecisiveness: no Suicidal ideations: no Hopelessness: no Crying spells: no    09/15/2023    3:03 PM 09/15/2023    1:50 PM 07/23/2023    9:55 AM 04/22/2023    8:43 AM 01/22/2023   11:30 AM  Depression screen PHQ 2/9  Decreased Interest 1 1 2 1 1   Down, Depressed, Hopeless 1 1 0 0 0  PHQ - 2 Score 2 2 2 1 1   Altered sleeping 0 0 0 2 1  Tired, decreased energy 1 1 1 1 1   Change in appetite 1 1 0 2 1  Feeling bad or failure about yourself  0 0  0 0 0  Trouble concentrating 1 1 2 1  0  Moving slowly or fidgety/restless 1 1 1  0 1  Suicidal thoughts 0 0 0 0 0  PHQ-9 Score 6 6 6 7 5   Difficult doing work/chores Not difficult at all Not difficult at all Somewhat difficult Somewhat difficult Somewhat difficult   DIABETES Has been out of Ozempic for 2.5 weeks.  Can feel his cravings coming back. Hypoglycemic episodes:no Polydipsia/polyuria: no Visual disturbance: no Chest pain: no Paresthesias: no Glucose Monitoring: yes  Accucheck frequency: Daily  Fasting glucose: 117  Post prandial:  Evening:  Before meals: Taking Insulin?: no  Long acting insulin:  Short acting insulin: Blood Pressure Monitoring: not checking Retinal Examination: Up to Date Foot Exam: Up to Date Diabetic Education: Not Completed Pneumovax: Up to Date Influenza: Up to Date Aspirin: no   Relevant past medical, surgical, family and social history reviewed and updated as indicated. Interim medical history since our last visit reviewed. Allergies and medications reviewed and updated.  Review of Systems  Constitutional: Negative.   Eyes:  Negative for visual disturbance.  Respiratory: Negative.  Negative for chest tightness and shortness of breath.   Cardiovascular: Negative.  Negative for chest pain, palpitations and leg swelling.  Gastrointestinal:  Negative.   Endocrine: Negative for polydipsia and polyuria.  Musculoskeletal:        Left foot pain  Neurological: Negative.  Negative for dizziness, light-headedness, numbness and headaches.  Psychiatric/Behavioral:  Positive for dysphoric mood. Negative for agitation, behavioral problems, confusion, decreased concentration, hallucinations, self-injury, sleep disturbance and suicidal ideas. The patient is not nervous/anxious and is not hyperactive.     Per HPI unless specifically indicated above     Objective:    There were no vitals taken for this visit.  Wt Readings from Last 3 Encounters:   09/15/23 239 lb 6.4 oz (108.6 kg)  09/06/23 240 lb (108.9 kg)  07/23/23 248 lb 6.4 oz (112.7 kg)    Physical Exam Vitals and nursing note reviewed.  Constitutional:      General: He is not in acute distress.    Appearance: Normal appearance. He is not ill-appearing, toxic-appearing or diaphoretic.  HENT:     Head: Normocephalic and atraumatic.     Right Ear: External ear normal.     Left Ear: External ear normal.     Nose: Nose normal. No congestion or rhinorrhea.     Mouth/Throat:     Mouth: Mucous membranes are moist.     Pharynx: Oropharynx is clear.  Eyes:     General: No scleral icterus.       Right eye: No discharge.        Left eye: No discharge.     Extraocular Movements: Extraocular movements intact.     Conjunctiva/sclera: Conjunctivae normal.     Pupils: Pupils are equal, round, and reactive to light.  Cardiovascular:     Rate and Rhythm: Normal rate and regular rhythm.     Heart sounds: No murmur heard. Pulmonary:     Effort: Pulmonary effort is normal. No respiratory distress.     Breath sounds: Normal breath sounds. No wheezing, rhonchi or rales.     Comments: Speaking in full sentences Abdominal:     General: Abdomen is flat. Bowel sounds are normal.  Musculoskeletal:        General: Normal range of motion.     Cervical back: Normal range of motion and neck supple.     Left foot: Tenderness present.     Comments: Heal and arch pain with palpation  Skin:    General: Skin is warm and dry.     Capillary Refill: Capillary refill takes less than 2 seconds.     Coloration: Skin is not jaundiced or pale.     Findings: No bruising, erythema, lesion or rash.  Neurological:     General: No focal deficit present.     Mental Status: He is alert and oriented to person, place, and time. Mental status is at baseline.  Psychiatric:        Mood and Affect: Mood normal.        Behavior: Behavior normal.        Thought Content: Thought content normal.        Judgment:  Judgment normal.    Results for orders placed or performed in visit on 09/15/23  Comp Met (CMET)  Result Value Ref Range   Glucose 94 70 - 99 mg/dL   BUN 16 8 - 27 mg/dL   Creatinine, Ser 1.61 (H) 0.76 - 1.27 mg/dL   eGFR 47 (L) >09 UE/AVW/0.98   BUN/Creatinine Ratio 10 10 - 24   Sodium 142 134 - 144 mmol/L   Potassium 4.8 3.5 - 5.2 mmol/L  Chloride 107 (H) 96 - 106 mmol/L   CO2 22 20 - 29 mmol/L   Calcium 9.3 8.6 - 10.2 mg/dL   Total Protein 6.4 6.0 - 8.5 g/dL   Albumin 4.1 3.9 - 4.9 g/dL   Globulin, Total 2.3 1.5 - 4.5 g/dL   Bilirubin Total 0.3 0.0 - 1.2 mg/dL   Alkaline Phosphatase 79 44 - 121 IU/L   AST 27 0 - 40 IU/L   ALT 14 0 - 44 IU/L      Assessment & Plan:   Problem List Items Addressed This Visit       Cardiovascular and Mediastinum   Aortic atherosclerosis (HCC) - Primary   CHF (congestive heart failure) (HCC)     Endocrine   Hyperlipidemia associated with type 2 diabetes mellitus (HCC)   Type 2 diabetes mellitus with diabetic neuropathy, unspecified (HCC)     Other   Mild episode of recurrent major depressive disorder (HCC)   Morbid obesity (HCC)       Follow up plan: No follow-ups on file.

## 2023-10-23 ENCOUNTER — Ambulatory Visit: Payer: Medicare Other | Admitting: Nurse Practitioner

## 2023-10-23 ENCOUNTER — Encounter: Payer: Self-pay | Admitting: Nurse Practitioner

## 2023-10-23 VITALS — BP 121/77 | HR 79 | Ht 70.5 in | Wt 247.4 lb

## 2023-10-23 DIAGNOSIS — F330 Major depressive disorder, recurrent, mild: Secondary | ICD-10-CM

## 2023-10-23 DIAGNOSIS — Z23 Encounter for immunization: Secondary | ICD-10-CM

## 2023-10-23 DIAGNOSIS — I700 Atherosclerosis of aorta: Secondary | ICD-10-CM | POA: Diagnosis not present

## 2023-10-23 DIAGNOSIS — E785 Hyperlipidemia, unspecified: Secondary | ICD-10-CM | POA: Diagnosis not present

## 2023-10-23 DIAGNOSIS — E1140 Type 2 diabetes mellitus with diabetic neuropathy, unspecified: Secondary | ICD-10-CM

## 2023-10-23 DIAGNOSIS — E1169 Type 2 diabetes mellitus with other specified complication: Secondary | ICD-10-CM

## 2023-10-23 DIAGNOSIS — I509 Heart failure, unspecified: Secondary | ICD-10-CM | POA: Diagnosis not present

## 2023-10-23 DIAGNOSIS — Z7985 Long-term (current) use of injectable non-insulin antidiabetic drugs: Secondary | ICD-10-CM

## 2023-10-23 LAB — MICROALBUMIN, URINE WAIVED
Creatinine, Urine Waived: 200 mg/dL (ref 10–300)
Microalb, Ur Waived: 30 mg/L — ABNORMAL HIGH (ref 0–19)
Microalb/Creat Ratio: <30 mg/g (ref <30)

## 2023-10-23 MED ORDER — TRAZODONE HCL 50 MG PO TABS: ORAL_TABLET | Freq: Every evening | ORAL | 1 refills | Status: DC | PRN

## 2023-10-23 MED ORDER — SEMAGLUTIDE (2 MG/DOSE) 8 MG/3ML ~~LOC~~ SOPN: 2.0000 mg | PEN_INJECTOR | SUBCUTANEOUS | 1 refills | Status: DC

## 2023-10-23 MED ORDER — FUROSEMIDE 20 MG PO TABS
ORAL_TABLET | ORAL | 1 refills | Status: DC
Start: 2023-10-23 — End: 2024-08-23

## 2023-10-23 NOTE — Assessment & Plan Note (Addendum)
Chronic.  Continue with current medication regimen.  On lasix daily- has been out but refilled today.  Continue to follow up with Cardiology.  Follow up in 3 months.  Call sooner if concerns arise. Has gained 5lbs but does not appear to be fluid. He appears Euvolemic on exam. Not weighing at home.   - Reminded to call for an overnight weight gain of >2 pounds or a weekly weight gain of >5 pounds - not adding salt to food and read food labels. Reviewed the importance of keeping daily sodium intake to 2000mg  daily. - Avoid Ibuprofen products.

## 2023-10-23 NOTE — Assessment & Plan Note (Signed)
Recommended eating smaller high protein, low fat meals more frequently and exercising 30 mins a day 5 times a week with a goal of 10-15lb weight loss in the next 3 months.  

## 2023-10-23 NOTE — Assessment & Plan Note (Signed)
Chronic.  Improved.  Continue with Celexa and Wellbutrin. Refills sent today.  Follow up in 3 months. Call sooner if concerns arise.

## 2023-10-23 NOTE — Assessment & Plan Note (Signed)
Chronic.  Controlled.  Continue with current medication regimen.  Continue with Plavix.  On Pravastatin weekly- doesn't tolerate more than weekly.  Labs ordered today.  Return to clinic in 3 months for reevaluation.  Call sooner if concerns arise.

## 2023-10-23 NOTE — Assessment & Plan Note (Signed)
Chronic.  Controlled.  Continue with current medication regimen of Pravastatin weekly.  Does not tolerate increased dosing.  Labs ordered today.  Return to clinic in 3 months for reevaluation.  Call sooner if concerns arise.

## 2023-10-23 NOTE — Assessment & Plan Note (Signed)
Chronic.  Controlled at 5.9%.  Admits that he has not been eating as well and has gained 5lbs.  Will increase Ozempic to 2mg .  Continue with other medications.  Labs ordered today.  Return to clinic in 3 months for reevaluation.  Call sooner if concerns arise.

## 2023-10-24 LAB — LIPID PANEL
Chol/HDL Ratio: 6.6 ratio — ABNORMAL HIGH (ref 0.0–5.0)
Cholesterol, Total: 223 mg/dL — ABNORMAL HIGH (ref 100–199)
HDL: 34 mg/dL — ABNORMAL LOW (ref >39)
LDL Chol Calc (NIH): 161 mg/dL — ABNORMAL HIGH (ref 0–99)
Triglycerides: 150 mg/dL — ABNORMAL HIGH (ref 0–149)
VLDL Cholesterol Cal: 28 mg/dL (ref 5–40)

## 2023-10-24 LAB — COMPREHENSIVE METABOLIC PANEL
ALT: 16 [IU]/L (ref 0–44)
AST: 21 [IU]/L (ref 0–40)
Albumin: 4.1 g/dL (ref 3.9–4.9)
Alkaline Phosphatase: 80 [IU]/L (ref 44–121)
BUN/Creatinine Ratio: 9 — ABNORMAL LOW (ref 10–24)
BUN: 12 mg/dL (ref 8–27)
Bilirubin Total: 0.3 mg/dL (ref 0.0–1.2)
CO2: 21 mmol/L (ref 20–29)
Calcium: 8.8 mg/dL (ref 8.6–10.2)
Chloride: 103 mmol/L (ref 96–106)
Creatinine, Ser: 1.41 mg/dL — ABNORMAL HIGH (ref 0.76–1.27)
Globulin, Total: 2.1 g/dL (ref 1.5–4.5)
Glucose: 83 mg/dL (ref 70–99)
Potassium: 5.1 mmol/L (ref 3.5–5.2)
Sodium: 139 mmol/L (ref 134–144)
Total Protein: 6.2 g/dL (ref 6.0–8.5)
eGFR: 55 mL/min/{1.73_m2} — ABNORMAL LOW (ref >59)

## 2023-10-24 LAB — HEMOGLOBIN A1C
Est. average glucose Bld gHb Est-mCnc: 120 mg/dL
Hgb A1c MFr Bld: 5.8 % — ABNORMAL HIGH (ref 4.8–5.6)

## 2023-12-02 ENCOUNTER — Other Ambulatory Visit: Payer: Self-pay | Admitting: Physician Assistant

## 2023-12-02 NOTE — Telephone Encounter (Signed)
Requested Prescriptions  Refused Prescriptions Disp Refills   clopidogrel (PLAVIX) 75 MG tablet [Pharmacy Med Name: CLOPIDOGREL 75MG  TABLETS] 90 tablet     Sig: TAKE 1 TABLET(75 MG) BY MOUTH AT BEDTIME     Hematology: Antiplatelets - clopidogrel Failed - 12/02/2023  5:24 PM      Failed - PLT in normal range and within 180 days    Platelets  Date Value Ref Range Status  09/06/2023 147 (L) 150 - 400 K/uL Final  10/21/2022 161 150 - 450 x10E3/uL Final         Failed - Cr in normal range and within 360 days    Creatinine  Date Value Ref Range Status  07/15/2014 1.07 0.60 - 1.30 mg/dL Final   Creatinine, Ser  Date Value Ref Range Status  10/23/2023 1.41 (H) 0.76 - 1.27 mg/dL Final         Passed - HCT in normal range and within 180 days    HCT  Date Value Ref Range Status  09/06/2023 39.8 39.0 - 52.0 % Final   Hematocrit  Date Value Ref Range Status  10/21/2022 41.1 37.5 - 51.0 % Final         Passed - HGB in normal range and within 180 days    Hemoglobin  Date Value Ref Range Status  09/06/2023 13.4 13.0 - 17.0 g/dL Final  16/09/9603 54.0 13.0 - 17.7 g/dL Final         Passed - Valid encounter within last 6 months    Recent Outpatient Visits           1 month ago Aortic atherosclerosis (HCC)   [place] [facility] Larae Grooms, NP   2 months ago Type 2 diabetes mellitus with diabetic neuropathy, without long-term current use of insulin (HCC)   [place] [facility] Pearley, Sherran Needs, NP   4 months ago Essential hypertension   [place] [facility] Mecum, Oswaldo Conroy, PA-C   7 months ago [facility] to [employer] preventive visit   [place] [facility] Burt, Clydie Braun, NP   10 months ago Type 2 diabetes mellitus with diabetic neuropathy, without long-term current use of insulin [facility])   [place] [facility] Larae Grooms, NP       Future Appointments             In 1 month  Larae Grooms, NP [place] [facility]

## 2023-12-15 ENCOUNTER — Telehealth: Payer: Self-pay | Admitting: Nurse Practitioner

## 2023-12-15 NOTE — Telephone Encounter (Signed)
Pt called in , says Dr Caren Griffins, changed him to the 2mg  of Ozempic, but says he went to pharmacy and was still given the 1 mg. Please cb to discuss

## 2024-01-08 DIAGNOSIS — I10 Essential (primary) hypertension: Secondary | ICD-10-CM | POA: Diagnosis not present

## 2024-01-08 DIAGNOSIS — Z955 Presence of coronary angioplasty implant and graft: Secondary | ICD-10-CM | POA: Diagnosis not present

## 2024-01-08 DIAGNOSIS — E782 Mixed hyperlipidemia: Secondary | ICD-10-CM | POA: Diagnosis not present

## 2024-01-08 DIAGNOSIS — I2510 Atherosclerotic heart disease of native coronary artery without angina pectoris: Secondary | ICD-10-CM | POA: Diagnosis not present

## 2024-01-19 ENCOUNTER — Other Ambulatory Visit: Payer: Self-pay | Admitting: Physician Assistant

## 2024-01-19 DIAGNOSIS — F330 Major depressive disorder, recurrent, mild: Secondary | ICD-10-CM

## 2024-01-20 NOTE — Telephone Encounter (Signed)
 Requested Prescriptions  Pending Prescriptions Disp Refills   buPROPion  (WELLBUTRIN  XL) 300 MG 24 hr tablet [Pharmacy Med Name: BUPROPION  XL 300MG  TABLETS] 90 tablet 1    Sig: TAKE 1 TABLET(300 MG) BY MOUTH DAILY     Psychiatry: Antidepressants - bupropion  Failed - 01/20/2024  3:35 PM      Failed - Cr in normal range and within 360 days    Creatinine  Date Value Ref Range Status  07/15/2014 1.07 0.60 - 1.30 mg/dL Final   Creatinine, Ser  Date Value Ref Range Status  10/23/2023 1.41 (H) 0.76 - 1.27 mg/dL Final         Passed - AST in normal range and within 360 days    AST  Date Value Ref Range Status  10/23/2023 21 0 - 40 IU/L Final         Passed - ALT in normal range and within 360 days    ALT  Date Value Ref Range Status  10/23/2023 16 0 - 44 IU/L Final         Passed - Completed PHQ-2 or PHQ-9 in the last 360 days      Passed - Last BP in normal range    BP Readings from Last 1 Encounters:  10/23/23 121/77         Passed - Valid encounter within last 6 months    Recent Outpatient Visits           2 months ago Aortic atherosclerosis (HCC)   [place] [facility] Melvin Pao, NP   4 months ago Type 2 diabetes mellitus with diabetic neuropathy, without long-term current use of insulin  (HCC)   [place] [facility] Pearley, Hyla Givens, NP   6 months ago Essential hypertension   [place] [facility] Mecum, Rocky BRAVO, PA-C   9 months ago Welcome to [employer] preventive visit   [place] [facility] [place], Pao, NP   12 months ago Type 2 diabetes mellitus with diabetic neuropathy, without long-term current use of insulin  [facility])   [place] [facility] Melvin Pao, NP       Future Appointments             In 3 days Melvin Pao, NP [place] [facility]             citalopram  (CELEXA ) 40 MG tablet [Pharmacy Med Name: CITALOPRAM  40MG  TABLETS]  90 tablet 1    Sig: TAKE 1 TABLET(40 MG) BY MOUTH DAILY     Psychiatry:  Antidepressants - SSRI Passed - 01/20/2024  3:35 PM      Passed - Completed PHQ-2 or PHQ-9 in the last 360 days      Passed - Valid encounter within last 6 months    Recent Outpatient Visits           2 months ago Aortic atherosclerosis [facility])   [place] [facility] Melvin Pao, NP   4 months ago Type 2 diabetes mellitus with diabetic neuropathy, without long-term current use of insulin  [place] [facility])   [place] [facility] Pearley, Hyla Givens, NP   6 months ago Essential hypertension   [place] [facility] Mecum, Rocky BRAVO, PA-C   9 months ago Welcome to [employer] preventive visit   [place] [facility] [place], Pao, NP   12 months ago Type 2 diabetes mellitus with diabetic neuropathy, without long-term current use of insulin  (HCC)   [facility] Melvin Pao, NP       Future Appointments             In 3 days Melvin Pao, NP [place] [facility]

## 2024-01-23 ENCOUNTER — Ambulatory Visit: Payer: Medicare Other | Admitting: Nurse Practitioner

## 2024-01-23 ENCOUNTER — Encounter: Payer: Self-pay | Admitting: Nurse Practitioner

## 2024-01-23 ENCOUNTER — Telehealth: Payer: Self-pay

## 2024-01-23 VITALS — BP 145/77 | HR 61 | Ht 70.5 in | Wt 247.8 lb

## 2024-01-23 DIAGNOSIS — N183 Chronic kidney disease, stage 3 unspecified: Secondary | ICD-10-CM | POA: Insufficient documentation

## 2024-01-23 DIAGNOSIS — Z7985 Long-term (current) use of injectable non-insulin antidiabetic drugs: Secondary | ICD-10-CM

## 2024-01-23 DIAGNOSIS — E1140 Type 2 diabetes mellitus with diabetic neuropathy, unspecified: Secondary | ICD-10-CM | POA: Diagnosis not present

## 2024-01-23 DIAGNOSIS — F330 Major depressive disorder, recurrent, mild: Secondary | ICD-10-CM | POA: Diagnosis not present

## 2024-01-23 DIAGNOSIS — I509 Heart failure, unspecified: Secondary | ICD-10-CM

## 2024-01-23 DIAGNOSIS — E1169 Type 2 diabetes mellitus with other specified complication: Secondary | ICD-10-CM

## 2024-01-23 DIAGNOSIS — N1831 Chronic kidney disease, stage 3a: Secondary | ICD-10-CM

## 2024-01-23 DIAGNOSIS — E785 Hyperlipidemia, unspecified: Secondary | ICD-10-CM

## 2024-01-23 DIAGNOSIS — I700 Atherosclerosis of aorta: Secondary | ICD-10-CM

## 2024-01-23 MED ORDER — LOSARTAN POTASSIUM 100 MG PO TABS: 100.0000 mg | ORAL_TABLET | Freq: Every day | ORAL | 1 refills | Status: DC

## 2024-01-23 NOTE — Assessment & Plan Note (Signed)
 Recommended eating smaller high protein, low fat meals more frequently and exercising 30 mins a day 5 times a week with a goal of 10-15lb weight loss in the next 3 months.

## 2024-01-23 NOTE — Assessment & Plan Note (Signed)
 Chronic.  Controlled. Continue with Celexa  and Wellbutrin .  Follow up in 3 months. Call sooner if concerns arise.

## 2024-01-23 NOTE — Assessment & Plan Note (Signed)
 Chronic.  Controlled at 5.9%.  Admits that he has not been eating as well and has gained 5lbs.  Not taking Ozempic  due to cost.  Referral placed for Pharmacy.  Continue with other medications.  Labs ordered today.  Return to clinic in 3 months for reevaluation.  Call sooner if concerns arise.

## 2024-01-23 NOTE — Progress Notes (Signed)
 BP (!) 145/77 (BP Location: Left Arm, Patient Position: Sitting, Cuff Size: Large)   Pulse 61   Ht 5' 10.5 (1.791 m)   Wt 247 lb 12.8 oz (112.4 kg)   SpO2 98%   BMI 35.05 kg/m    Subjective:    Patient ID: Steven Vang, male    DOB: Apr 22, 1956, 68 y.o.   MRN: [id number]  HPI: [name] is a 68 y.o. male  Chief Complaint  Patient presents with   Diabetes   Hypertension   Hyperlipidemia   3 month follow up    HYPERTENSION without Chronic Kidney Disease Hypertension status: uncontrolled  Satisfied with current treatment? no Duration of hypertension: years BP monitoring frequency:  weekly BP range: 140/70 BP medication side effects:  no Medication compliance: excellent compliance Previous BP meds: Lasix , Metoprolol , losartan  Aspirin : no Recurrent headaches: no Visual changes: no Palpitations: no Dyspnea: no Chest pain: no Lower extremity edema: no Dizzy/lightheaded: no   DEPRESSION Patient states he's been okay.  Mood status: better Satisfied with current treatment?: no Symptom severity: mild  Duration of current treatment : years Side effects: no Medication compliance: good compliance Psychotherapy/counseling: no  Previous psychiatric medications: celexa , wellbutrin  Depressed mood: yes Anxious mood: yes Anhedonia: no Significant weight loss or gain: yes Insomnia: no  Fatigue: yes Feelings of worthlessness or guilt: no Impaired concentration/indecisiveness: no Suicidal ideations: no Hopelessness: no Crying spells: no    10/23/2023   10:09 AM 09/15/2023    3:03 PM 09/15/2023    1:50 PM 07/23/2023    9:55 AM 04/22/2023    8:43 AM  Depression screen PHQ 2/9  Decreased Interest 1 1 1 2 1   Down, Depressed, Hopeless 0 1 1 0 0  PHQ - 2 Score 1 2 2 2 1   Altered sleeping 0 0 0 0 2  Tired, decreased energy 1 1 1 1 1   Change in appetite 1 1 1  0 2  Feeling bad or failure about yourself  0 0 0 0 0  Trouble concentrating 1 1 1 2 1   Moving slowly or  fidgety/restless 0 1 1 1  0  Suicidal thoughts 0 0 0 0 0  PHQ-9 Score 4 6 6 6 7   Difficult doing work/chores  Not difficult at all Not difficult at all Somewhat difficult Somewhat difficult   DIABETES Ozempic  1mg . Hasn't been taking Ozempic  for about a month due to it costing him about $400. Hypoglycemic episodes:no Polydipsia/polyuria: no Visual disturbance: no Chest pain: no Paresthesias: no Glucose Monitoring: yes  Accucheck frequency:not checking  Fasting glucose:   Post prandial:  Evening:  Before meals: Taking Insulin ?: no  Long acting insulin :  Short acting insulin : Blood Pressure Monitoring: not checking Retinal Examination: Up to Date Foot Exam: Up to Date Diabetic Education: Not Completed Pneumovax: Up to Date Influenza: Up to Date Aspirin : no   Relevant past medical, surgical, family and social history reviewed and updated as indicated. Interim medical history since our last visit reviewed. Allergies and medications reviewed and updated.  Review of Systems  Eyes:  Negative for visual disturbance.  Respiratory: Negative.  Negative for chest tightness and shortness of breath.   Cardiovascular: Negative.  Negative for chest pain, palpitations and leg swelling.  Gastrointestinal: Negative.   Endocrine: Negative for polydipsia and polyuria.  Musculoskeletal:        Left foot pain  Neurological: Negative.  Negative for dizziness, light-headedness, numbness and headaches.  Psychiatric/Behavioral:  Positive for dysphoric mood. Negative for agitation, behavioral problems,  confusion, decreased concentration, hallucinations, self-injury, sleep disturbance and suicidal ideas. The patient is not nervous/anxious and is not hyperactive.     Per HPI unless specifically indicated above     Objective:    BP (!) 145/77 (BP Location: Left Arm, Patient Position: Sitting, Cuff Size: Large)   Pulse 61   Ht 5' 10.5 (1.791 m)   Wt 247 lb 12.8 oz (112.4 kg)   SpO2 98%   BMI  35.05 kg/m   Wt Readings from Last 3 Encounters:  01/23/24 247 lb 12.8 oz (112.4 kg)  10/23/23 247 lb 6.4 oz (112.2 kg)  09/15/23 239 lb 6.4 oz (108.6 kg)    Physical Exam Vitals and nursing note reviewed.  Constitutional:      General: He is not in acute distress.    Appearance: Normal appearance. He is obese. He is not ill-appearing, toxic-appearing or diaphoretic.  HENT:     Head: Normocephalic and atraumatic.     Right Ear: External ear normal.     Left Ear: External ear normal.     Nose: Nose normal. No congestion or rhinorrhea.     Mouth/Throat:     Mouth: Mucous membranes are moist.     Pharynx: Oropharynx is clear.  Eyes:     General: No scleral icterus.       Right eye: No discharge.        Left eye: No discharge.     Extraocular Movements: Extraocular movements intact.     Conjunctiva/sclera: Conjunctivae normal.     Pupils: Pupils are equal, round, and reactive to light.  Cardiovascular:     Rate and Rhythm: Normal rate and regular rhythm.     Heart sounds: No murmur heard. Pulmonary:     Effort: Pulmonary effort is normal. No respiratory distress.     Breath sounds: Normal breath sounds. No wheezing, rhonchi or rales.     Comments: Speaking in full sentences Abdominal:     General: Abdomen is flat. Bowel sounds are normal.  Musculoskeletal:        General: Normal range of motion.     Cervical back: Normal range of motion and neck supple.     Left foot: Tenderness present.     Comments: Heal and arch pain with palpation  Skin:    General: Skin is warm and dry.     Capillary Refill: Capillary refill takes less than 2 seconds.     Coloration: Skin is not jaundiced or pale.     Findings: No bruising, erythema, lesion or rash.  Neurological:     General: No focal deficit present.     Mental Status: He is alert and oriented to person, place, and time. Mental status is at baseline.  Psychiatric:        Mood and Affect: Mood normal.        Behavior: Behavior  normal.        Thought Content: Thought content normal.        Judgment: Judgment normal.     Results for orders placed or performed in visit on 10/23/23  Microalbumin, Urine Waived   Collection Time: 10/23/23 10:24 AM  Result Value Ref Range   Microalb, Ur Waived 30 (H) 0 - 19 mg/L   Creatinine, Urine Waived 200 10 - 300 mg/dL   Microalb/Creat Ratio <30 <30 mg/g  Comp Met (CMET)   Collection Time: 10/23/23 10:25 AM  Result Value Ref Range   Glucose 83 70 - 99 mg/dL  BUN 12 8 - 27 mg/dL   Creatinine, Ser 8.58 (H) 0.76 - 1.27 mg/dL   eGFR 55 (L) >40 fO/fpw/8.26   BUN/Creatinine Ratio 9 (L) 10 - 24   Sodium 139 134 - 144 mmol/L   Potassium 5.1 3.5 - 5.2 mmol/L   Chloride 103 96 - 106 mmol/L   CO2 21 20 - 29 mmol/L   Calcium 8.8 8.6 - 10.2 mg/dL   Total Protein 6.2 6.0 - 8.5 g/dL   Albumin  4.1 3.9 - 4.9 g/dL   Globulin, Total 2.1 1.5 - 4.5 g/dL   Bilirubin Total 0.3 0.0 - 1.2 mg/dL   Alkaline Phosphatase 80 44 - 121 IU/L   AST 21 0 - 40 IU/L   ALT 16 0 - 44 IU/L  Lipid Profile   Collection Time: 10/23/23 10:25 AM  Result Value Ref Range   Cholesterol, Total 223 (H) 100 - 199 mg/dL   Triglycerides 849 (H) 0 - 149 mg/dL   HDL 34 (L) >60 mg/dL   VLDL Cholesterol Cal 28 5 - 40 mg/dL   LDL Chol Calc (NIH) 838 (H) 0 - 99 mg/dL   Chol/HDL Ratio 6.6 (H) 0.0 - 5.0 ratio  HgB A1c   Collection Time: 10/23/23 10:25 AM  Result Value Ref Range   Hgb A1c MFr Bld 5.8 (H) 4.8 - 5.6 %   Est. average glucose Bld gHb Est-mCnc 120 mg/dL      Assessment & Plan:   Problem List Items Addressed This Visit       Cardiovascular and Mediastinum   Aortic atherosclerosis (HCC)   CHF (congestive heart failure) (HCC)     Endocrine   Hyperlipidemia associated with type 2 diabetes mellitus (HCC)   Type 2 diabetes mellitus with diabetic neuropathy, unspecified (HCC)     Genitourinary   CKD (chronic kidney disease), stage III (HCC)     Other   Mild episode of recurrent major depressive  disorder (HCC) - Primary   Morbid obesity (HCC)       Follow up plan: No follow-ups on file.

## 2024-01-23 NOTE — Assessment & Plan Note (Signed)
 Chronic.  Controlled.  Continue with current medication regimen.  Continue with Plavix.  On Pravastatin weekly- doesn't tolerate more than weekly.  Labs ordered today.  Return to clinic in 3 months for reevaluation.  Call sooner if concerns arise.

## 2024-01-23 NOTE — Progress Notes (Signed)
 Care Guide Pharmacy Note  01/23/2024 Name: Steven Vang MRN: [id number] DOB: 1956/09/29  Referred By: Melvin Pao, NP Reason for referral: Care Coordination (Outreach to schedule with Pharm d )   Steven Vang is a 68 y.o. year old male who is a primary care patient of Melvin Pao, NP.  Steven Vang was referred to the pharmacist for assistance related to: DMII  Successful contact was made with the patient to discuss pharmacy services including being ready for the pharmacist to call at least 5 minutes before the scheduled appointment time and to have medication bottles and any blood pressure readings ready for review. The patient agreed to meet with the pharmacist via telephone visit on (date/time).01/27/2024  [name] , RMA     [place]  [facility] Guide  Direct Dial: [phone]  Website: [email or web address]

## 2024-01-23 NOTE — Assessment & Plan Note (Signed)
 Found on Labs.  On Farxiga  and Losartan .  Work to get blood pressure under control.

## 2024-01-23 NOTE — Assessment & Plan Note (Signed)
 Chronic.  Controlled.  Continue with current medication regimen of Pravastatin weekly.  Does not tolerate increased dosing.  Labs ordered today.  Return to clinic in 3 months for reevaluation.  Call sooner if concerns arise.

## 2024-01-23 NOTE — Assessment & Plan Note (Signed)
 Chronic.  Continue with current medication regimen.  On lasix  daily- has been out but refilled today.  Continue to follow up with Cardiology.  Follow up in 3 months.  Call sooner if concerns arise.  He appears Euvolemic on exam. Not weighing at home.   - Reminded to call for an overnight weight gain of >2 pounds or a weekly weight gain of >5 pounds - not adding salt to food and read food labels. Reviewed the importance of keeping daily sodium intake to 2000mg  daily. - Avoid Ibuprofen products.

## 2024-01-27 ENCOUNTER — Other Ambulatory Visit: Payer: Self-pay

## 2024-01-27 NOTE — Progress Notes (Signed)
   01/27/2024 Name: Steven Vang MRN: [id number] DOB: August 22, 1956  Chief Complaint  Patient presents with   Medication Assistance   Steven Vang is a 68 y.o. year old male who presented for a telephone visit.   They were referred to the pharmacist by their PCP for assistance in managing medication access.   Subjective:  Care Team: Primary Care Provider: Larae Grooms, NP ; Next Scheduled Visit: 5/9  Medication Access/Adherence  Current Pharmacy:  [id number] [place], [place] - [address] AT [facility] [address] [place] [address] Phone: [phone] Fax: [phone]  -Patient reports affordability concerns with their medications: Yes  -Patient reports access/transportation concerns to their pharmacy: No  -Patient reports adherence concerns with their medications:  Yes    Diabetes: Current medications: Farxiga 10 mg daily -Patient is also prescribed Ozempic 2 mg weekly, but he has been without this medication for approximately 1 month due to cost.  When he went to pick up a refill this month, his co-pay was going to be over $400 for a 1 month supply. -Patient has been well-controlled on regimen of Ozempic 2 mg weekly and Farxiga 10 mg daily. -Most recent A1c 5.8%  Objective: Lab Results  Component Value Date   HGBA1C 5.8 (H) 10/23/2023   Lab Results  Component Value Date   CREATININE 1.41 (H) 10/23/2023   BUN 12 10/23/2023   NA 139 10/23/2023   K 5.1 10/23/2023   CL 103 10/23/2023   CO2 21 10/23/2023   Lab Results  Component Value Date   CHOL 223 (H) 10/23/2023   HDL 34 (L) 10/23/2023   LDLCALC 161 (H) 10/23/2023   TRIG 150 (H) 10/23/2023   CHOLHDL 6.6 (H) 10/23/2023   Assessment/Plan:   Diabetes: -Currently controlled -Meets financial criteria for Ozempic 2 mg patient assistance program through [employer].  Submitting online application for assistance, and contacting PCP office to see if they  happen to have any higher doses of Ozempic available that patient could get in the meantime.  Follow Up Plan: Will notify patient about sample availability and keep updated on progress of PAP.  At next follow-up, will discuss cholesterol, as LDL is elevated at 161.  Steven Vang, PharmD, DPLA

## 2024-01-27 NOTE — Progress Notes (Signed)
Hi there,  We do have the 0.25/0.5 mg dose in office currently?

## 2024-01-27 NOTE — Progress Notes (Signed)
Called patient and he is aware that he has a sample for pick up. He also needs labs so will be here in the am.

## 2024-01-28 ENCOUNTER — Telehealth: Payer: Self-pay

## 2024-01-28 ENCOUNTER — Other Ambulatory Visit: Payer: Medicare Other

## 2024-01-28 DIAGNOSIS — I10 Essential (primary) hypertension: Secondary | ICD-10-CM | POA: Diagnosis not present

## 2024-01-28 NOTE — Progress Notes (Signed)
   01/28/2024  Patient ID: Steven Vang, male   DOB: May 08, 1956, 68 y.o.   MRN: [id number]  Patient out reach to follow-up with Mr. Kleinpeter to verify he picked up Ozempic 0.5 mg weekly sample available at [facility] family [facility].  Patient has and will resume medication at this dose while we await patient assistance application determination from [employer] for Ozempic 2 mg.  Inform patient application was submitted online yesterday, and I will call to check on processing status the end of this week or beginning of next week and notify him.  Lenna Gilford, PharmD, DPLA

## 2024-01-29 ENCOUNTER — Encounter: Payer: Self-pay | Admitting: Nurse Practitioner

## 2024-01-29 LAB — COMPREHENSIVE METABOLIC PANEL
ALT: 13 [IU]/L (ref 0–44)
AST: 20 [IU]/L (ref 0–40)
Albumin: 4.2 g/dL (ref 3.9–4.9)
Alkaline Phosphatase: 82 [IU]/L (ref 44–121)
BUN/Creatinine Ratio: 9 — ABNORMAL LOW (ref 10–24)
BUN: 13 mg/dL (ref 8–27)
Bilirubin Total: 0.2 mg/dL (ref 0.0–1.2)
CO2: 23 mmol/L (ref 20–29)
Calcium: 9.1 mg/dL (ref 8.6–10.2)
Chloride: 104 mmol/L (ref 96–106)
Creatinine, Ser: 1.5 mg/dL — ABNORMAL HIGH (ref 0.76–1.27)
Globulin, Total: 2.5 g/dL (ref 1.5–4.5)
Glucose: 143 mg/dL — ABNORMAL HIGH (ref 70–99)
Potassium: 4.7 mmol/L (ref 3.5–5.2)
Sodium: 139 mmol/L (ref 134–144)
Total Protein: 6.7 g/dL (ref 6.0–8.5)
eGFR: 51 mL/min/{1.73_m2} — ABNORMAL LOW (ref >59)

## 2024-02-03 ENCOUNTER — Telehealth: Payer: Self-pay

## 2024-02-03 NOTE — Progress Notes (Signed)
   02/03/2024  Patient ID: Steven Vang, male   DOB: Jul 10, 1956, 68 y.o.   MRN: [id number]  Contacted [employer] to follow-up on processing status of patient's PAP application for Ozempic 2mg  weekly.  Application approved as of today- enrollment good through 12/15/2024.  Order went into process today and should be received at PCP office in the next 14 business days.  Company is currently experiencing shipping delays, though; so if medication is not received by 3/10, patient should be able to get a free 30 day voucher to fill medication at his local pharmacy.  The 0.5mg  weekly sample picked up last week will take him right up to the date we can get a voucher; so he should not have to go without medication.  Attempted to contact patient to make him aware but had to leave voicemail.  Lenna Gilford, PharmD, DPLA

## 2024-02-09 ENCOUNTER — Telehealth: Payer: Self-pay

## 2024-02-09 NOTE — Progress Notes (Signed)
   02/09/2024  Patient ID: Steven Vang, male   DOB: May 27, 1956, 68 y.o.   MRN: [id number]  Patient outreach to inform patient [employer] P[employer] application was been approved.  Patient was provided with sample by [facility] to use until medication arrives.  Patient has 2 doses left in pen- if medication has not arrived by 3/10, patient should be eligible for 30d voucher.  I will follow-up then and assist with voucher if needed.  Lenna Gilford, PharmD, DPLA

## 2024-02-23 ENCOUNTER — Telehealth: Payer: Self-pay

## 2024-02-23 MED ORDER — SEMAGLUTIDE (2 MG/DOSE) 8 MG/3ML ~~LOC~~ SOPN: 2.0000 mg | PEN_INJECTOR | SUBCUTANEOUS | 0 refills | Status: AC

## 2024-02-23 NOTE — Progress Notes (Addendum)
   02/23/2024  Patient ID: Steven Vang, male   DOB: 27-Apr-1956, 68 y.o.   MRN: [id number]  Contacted [employer] to check on shipping status of Ozempic 2mg , and prescription is still processing but has not shipped due to delivery delays.  Company was able to provide me with a 30 day voucher patient can use to get 1 month fill at his [facility] at no cost.  Prescription pending with voucher processing information for PCP to sign if in agreement.  Contacted patient to make him aware.  Lenna Gilford, PharmD, DPLA

## 2024-03-18 ENCOUNTER — Other Ambulatory Visit: Payer: Self-pay | Admitting: Nurse Practitioner

## 2024-03-18 ENCOUNTER — Telehealth: Payer: Self-pay

## 2024-03-18 DIAGNOSIS — F330 Major depressive disorder, recurrent, mild: Secondary | ICD-10-CM

## 2024-03-18 NOTE — Telephone Encounter (Signed)
 Copied from CRM [id number]. Topic: Clinical - Medication Refill >> Mar 18, 2024  9:24 AM Gery Pray wrote: Most Recent Primary Care Visit:  Provider: [facility]  Department: [facility]  Visit Type: LAB  Date: 01/28/2024  Medication: citalopram (CELEXA) 40 MG tablet   Has the patient contacted their pharmacy? No (Agent: If no, request that the patient contact the pharmacy for the refill. If patient does not wish to contact the pharmacy document the reason why and proceed with request.) (Agent: If yes, when and what did the pharmacy advise?) Bottle has no refills. Stated provider has to refill it   Is this the correct pharmacy for this prescription? Yes If no, delete pharmacy and type the correct one.  This is the patient's preferred pharmacy:  [facility] #[id number] [place], [place] - [address] AT [facility] OF [address] [place] [address] Phone: [phone] Fax: [phone]   Has the prescription been filled recently? No  Is the patient out of the medication? Yes  Has the patient been seen for an appointment in the last year OR does the patient have an upcoming appointment? Yes  Can we respond through MyChart? No   Agent: Please be advised that Rx refills may take up to 3 business days. We ask that you follow-up with your pharmacy.

## 2024-03-18 NOTE — Telephone Encounter (Signed)
 According to chart, new prescription was in in February for a 90 day supply with an additional refill.   Contacted [facility] and they are getting the prescription ready for the patient.   Called and notified patient of the above.

## 2024-03-18 NOTE — Telephone Encounter (Signed)
 Copied from CRM [id number]. Topic: Clinical - Prescription Issue >> Mar 18, 2024  2:27 PM Shelah Lewandowsky wrote: Reason for CRM: citalopram (CELEXA) 40 MG tablet- still waiting for refill- advised it is pending

## 2024-04-23 ENCOUNTER — Ambulatory Visit: Payer: Medicare Other | Admitting: Nurse Practitioner

## 2024-05-18 ENCOUNTER — Ambulatory Visit: Admitting: Nurse Practitioner

## 2024-05-18 ENCOUNTER — Encounter: Payer: Self-pay | Admitting: Nurse Practitioner

## 2024-05-18 VITALS — BP 109/71 | HR 68 | Ht 70.5 in | Wt 247.0 lb

## 2024-05-18 DIAGNOSIS — I509 Heart failure, unspecified: Secondary | ICD-10-CM | POA: Diagnosis not present

## 2024-05-18 DIAGNOSIS — Z0000 Encounter for general adult medical examination without abnormal findings: Secondary | ICD-10-CM

## 2024-05-18 DIAGNOSIS — E785 Hyperlipidemia, unspecified: Secondary | ICD-10-CM

## 2024-05-18 DIAGNOSIS — E1169 Type 2 diabetes mellitus with other specified complication: Secondary | ICD-10-CM

## 2024-05-18 DIAGNOSIS — Z7189 Other specified counseling: Secondary | ICD-10-CM

## 2024-05-18 DIAGNOSIS — F330 Major depressive disorder, recurrent, mild: Secondary | ICD-10-CM

## 2024-05-18 DIAGNOSIS — I10 Essential (primary) hypertension: Secondary | ICD-10-CM

## 2024-05-18 DIAGNOSIS — E1140 Type 2 diabetes mellitus with diabetic neuropathy, unspecified: Secondary | ICD-10-CM | POA: Diagnosis not present

## 2024-05-18 DIAGNOSIS — N1831 Chronic kidney disease, stage 3a: Secondary | ICD-10-CM

## 2024-05-18 MED ORDER — ARIPIPRAZOLE 2 MG PO TABS: 2.0000 mg | ORAL_TABLET | Freq: Every day | ORAL | 0 refills | Status: DC

## 2024-05-18 NOTE — Assessment & Plan Note (Signed)
Chronic, historic condition Appears well controlled with current regimen comprised of Losartan 50 mg PO every day, Metoprolol 50 mg PO every day,  Continue current regimen Continue to check BP at home for monitoring  Follow up in 3 months or sooner if concerns arise

## 2024-05-18 NOTE — Assessment & Plan Note (Signed)
 Chronic.  Controlled.  Continue with current medication regimen.  Labs ordered today.  Return to clinic in 6 months for reevaluation.  Call sooner if concerns arise.  ? ?

## 2024-05-18 NOTE — Assessment & Plan Note (Signed)
 A voluntary discussion about advance care planning including the explanation and discussion of advance directives was extensively discussed  with the patient for 5 minutes with patient.  Explanation about the health care proxy and Living will was reviewed and packet with forms with explanation of how to fill them out was given.  During this discussion, the patient was able to identify a health care proxy as his wife, Hettie Lota, and plans to fill out the paperwork required.  Patient was offered a separate Advance Care Planning visit for further assistance with forms.

## 2024-05-18 NOTE — Assessment & Plan Note (Signed)
 Followed by Cardiology.  Most recent note reviewed.  Continue to follow up with specialist.

## 2024-05-18 NOTE — Assessment & Plan Note (Signed)
 Recommended eating smaller high protein, low fat meals more frequently and exercising 30 mins a day 5 times a week with a goal of 10-15lb weight loss in the next 3 months.

## 2024-05-18 NOTE — Assessment & Plan Note (Signed)
 Chronic.  Controlled at 5.8%. Doing well with Ozempic  2mg .  Weight is the same.  Does have some GI upset at times with the medication.  Cost is no longer an issue. Continue with other medications.  Labs ordered today.  Return to clinic in 3 months for reevaluation.  Call sooner if concerns arise.

## 2024-05-18 NOTE — Progress Notes (Signed)
 Subjective:   Steven Vang is a 68 y.o. male who presents for Medicare Annual/Subsequent preventive examination.  Visit Complete: In person  Patient Medicare AWV questionnaire was completed by the patient on 05/18/2024; I have confirmed that all information answered by patient is correct and no changes since this date.        Objective:     Today's Vitals   05/18/24 1024  BP: 109/71  Pulse: 68  Weight: 247 lb (112 kg)  Height: 5' 10.5" (1.791 m)   Body mass index is 34.94 kg/m.     09/06/2023    9:12 PM 11/28/2021   10:42 AM 10/15/2020    6:12 PM 10/15/2020   10:12 AM 10/01/2020    4:06 PM 09/27/2020    3:19 PM 09/27/2020   10:32 AM  Advanced Directives  Does Patient Have a Medical Advance Directive? No No No No No No No  Would patient like information on creating a medical advance directive? No - Patient declined No - Patient declined No - Patient declined  No - Patient declined No - Patient declined No - Patient declined    Current Medications (verified) Outpatient Encounter Medications as of 05/18/2024  Medication Sig   ARIPiprazole (ABILIFY) 2 MG tablet Take 1 tablet (2 mg total) by mouth daily.   aspirin  81 MG EC tablet Take 1 tablet by mouth daily.   Blood Glucose Monitoring Suppl (ONE TOUCH ULTRA 2) w/Device KIT 1 each by Does not apply route daily.   buPROPion  (WELLBUTRIN  XL) 300 MG 24 hr tablet TAKE 1 TABLET(300 MG) BY MOUTH DAILY   citalopram  (CELEXA ) 40 MG tablet TAKE 1 TABLET(40 MG) BY MOUTH DAILY   clopidogrel  (PLAVIX ) 75 MG tablet Take 1 tablet (75 mg total) by mouth daily.   dapagliflozin  propanediol (FARXIGA ) 10 MG TABS tablet Take 1 tablet (10 mg total) by mouth daily before breakfast.   furosemide  (LASIX ) 20 MG tablet Take 1 tablet daily   gabapentin  (NEURONTIN ) 300 MG capsule Take 2 capsules (600 mg total) by mouth at bedtime as needed.   glucose blood (ONETOUCH ULTRA) test strip 1 each by Other route daily. Use as instructed   Lancets  (ONETOUCH ULTRASOFT) lancets Use as instructed   losartan  (COZAAR ) 100 MG tablet Take 1 tablet (100 mg total) by mouth daily.   metoprolol  succinate (TOPROL -XL) 50 MG 24 hr tablet Take 1 tablet (50 mg total) by mouth daily.   nitroGLYCERIN  (NITROSTAT ) 0.4 MG SL tablet Place 1 tablet (0.4 mg total) under the tongue every 5 (five) minutes as needed for Chest pain May take up to 3 doses.   omeprazole  (PRILOSEC) 20 MG capsule Take 1 capsule (20 mg total) by mouth daily.   pravastatin  (PRAVACHOL ) 80 MG tablet Take 1 tablet (80 mg total) by mouth once a week. Take 1 tab once weekly   Semaglutide , 2 MG/DOSE, 8 MG/3ML SOPN Inject 2 mg as directed once a week. Do NOT bill insurance, please bill manufacturer voucher:  Bin [id number], PCN [id number], Grp [id number], ID [id number]   traZODone  (DESYREL ) 50 MG tablet TAKE 1 TABLET BY MOUTH ONCE DAILY AT BEDTIME AS NEEDED   No facility-administered encounter medications on file as of 05/18/2024.    Allergies (verified) Patient has no known allergies.   History: Past Medical History:  Diagnosis Date   Abnormal EKG    Anxiety disorder    CAD (coronary artery disease)    CHF (congestive heart failure) (HCC)    Chronic systolic heart failure (  HCC)    Depression    Diabetes mellitus (HCC)    GERD (gastroesophageal reflux disease)    Hypertension    Lipoma of head 03/31/2020   Mixed hyperlipidemia    Non morbid obesity due to excess calories    Past Surgical History:  Procedure Laterality Date   ANKLE FRACTURE SURGERY Right    CARDIAC CATHETERIZATION     CHOLECYSTECTOMY     COLONOSCOPY N/A 11/28/2021   Procedure: COLONOSCOPY;  Surgeon: Selena Daily, MD;  Location: [facility] ENDOSCOPY;  Service: Gastroenterology;  Laterality: N/A;   CORONARY STENT PLACEMENT  2015   3 stents   HAND SURGERY Right    LEFT HEART CATH AND CORONARY ANGIOGRAPHY Left 06/02/2020   Procedure: LEFT HEART CATH AND CORONARY ANGIOGRAPHY;  Surgeon: Cherrie Cornwall, MD;  Location: [facility]  INVASIVE CV LAB;  Service: Cardiovascular;  Laterality: Left;   Family History  Problem Relation Age of Onset   CAD Mother    Diabetes Mother    Colon cancer Father    Breast cancer Paternal Grandmother    Stomach cancer Paternal Grandfather    Social History   Socioeconomic History   Marital status: Married    Spouse name: Not on file   Number of children: Not on file   Years of education: Not on file   Highest education level: Not on file  Occupational History   Not on file  Tobacco Use   Smoking status: Former    Current packs/day: 0.00    Types: Cigarettes    Quit date: 1990    Years since quitting: 35.4   Smokeless tobacco: Never  Vaping Use   Vaping status: Never Used  Substance and Sexual Activity   Alcohol use: Not Currently   Drug use: Never   Sexual activity: Not on file  Other Topics Concern   Not on file  Social History Narrative   Not on file   Social Drivers of Health   Financial Resource Strain: Not on file  Food Insecurity: Not on file  Transportation Needs: Not on file  Physical Activity: Not on file  Stress: Not on file  Social Connections: Not on file    Tobacco Counseling Counseling given: Not Answered   Clinical Intake:                        Activities of Daily Living     No data to display          Patient Care Team: Aileen Alexanders, NP as PCP - General (Nurse Practitioner) Bryna Car, [facility]  Indicate any recent Medical Services you may have received from other than [facility] providers in the past year (date may be approximate).     Assessment:    This is a routine wellness examination for Steven Vang.  Hearing/Vision screen No results found.   Goals Addressed   None    Depression Screen    05/18/2024   10:40 AM 01/23/2024    8:19 AM 10/23/2023   10:09 AM 09/15/2023    3:03 PM 09/15/2023    1:50 PM 07/23/2023    9:55 AM 04/22/2023    8:43 AM  PHQ 2/9 Scores  PHQ - 2 Score 1 1 1 2 2 2 1   PHQ- 9  Score 1 4 4 6 6 6 7     Fall Risk    07/23/2023    9:55 AM 04/22/2023    8:43 AM 01/22/2023   11:30  AM 10/21/2022    9:39 AM 05/31/2022   11:42 AM  Fall Risk   Falls in the past year? 1 0 0 0 0  Number falls in past yr: 0 0 0 0 0  Injury with Fall? 0 0 0 0 0  Risk for fall due to : No Fall Risks No Fall Risks No Fall Risks No Fall Risks No Fall Risks  Follow up Falls evaluation completed Falls evaluation completed Falls evaluation completed Falls evaluation completed Falls evaluation completed    MEDICARE RISK AT HOME:    TIMED UP AND GO:  Was the test performed?  No    Cognitive Function:        04/22/2023    8:46 AM  6CIT Screen  What Year? 0 points  What month? 0 points  What time? 0 points  Count back from 20 0 points  Months in reverse 0 points  Repeat phrase 4 points  Total Score 4 points    Immunizations Immunization History  Administered Date(s) Administered   Fluad Quad(high Dose 65+) 10/21/2022   Fluad Trivalent(High Dose 65+) 10/23/2023   Influenza,inj,Quad PF,6+ Mos 12/05/2015, 02/05/2017, 10/22/2017, 09/22/2018, 11/22/2020, 10/17/2021   Influenza-Unspecified 11/15/2014, 10/29/2019   PNEUMOCOCCAL CONJUGATE-20 10/21/2022   Pneumococcal Polysaccharide-23 12/05/2015   Zoster Recombinant(Shingrix) 10/29/2019    TDAP status: Due, Education has been provided regarding the importance of this vaccine. Advised may receive this vaccine at local pharmacy or Health Dept. Aware to provide a copy of the vaccination record if obtained from local pharmacy or Health Dept. Verbalized acceptance and understanding.  Flu Vaccine status: Up to date  Pneumococcal vaccine status: Up to date  Covid-19 vaccine status: Declined, Education has been provided regarding the importance of this vaccine but patient still declined. Advised may receive this vaccine at local pharmacy or Health Dept.or vaccine clinic. Aware to provide a copy of the vaccination record if obtained from local  pharmacy or Health Dept. Verbalized acceptance and understanding.  Qualifies for Shingles Vaccine? Yes   Zostavax completed No   Shingrix Completed?: No.    Education has been provided regarding the importance of this vaccine. Patient has been advised to call insurance company to determine out of pocket expense if they have not yet received this vaccine. Advised may also receive vaccine at local pharmacy or Health Dept. Verbalized acceptance and understanding.  Screening Tests Health Maintenance  Topic Date Due   DTaP/Tdap/Td (1 - Tdap) Never done   OPHTHALMOLOGY EXAM  10/02/2023   Medicare Annual Wellness (AWV)  04/29/2024   HEMOGLOBIN A1C  04/21/2024   INFLUENZA VACCINE  07/16/2024   Diabetic kidney evaluation - Urine ACR  10/22/2024   FOOT EXAM  10/22/2024   Diabetic kidney evaluation - eGFR measurement  01/27/2025   Colonoscopy  11/28/2026   Pneumonia Vaccine 61+ Years old  Completed   Hepatitis C Screening  Completed   HPV VACCINES  Aged Out   Meningococcal B Vaccine  Aged Out   COVID-19 Vaccine  Discontinued   Zoster Vaccines- Shingrix  Discontinued    Health Maintenance  Health Maintenance Due  Topic Date Due   DTaP/Tdap/Td (1 - Tdap) Never done   OPHTHALMOLOGY EXAM  10/02/2023   Medicare Annual Wellness (AWV)  04/29/2024   HEMOGLOBIN A1C  04/21/2024    Colorectal cancer screening: Type of screening: Colonoscopy. Completed 11/28/2021. Repeat every 5 years  Lung Cancer Screening: (Low Dose CT Chest recommended if Age 54-80 years, 20 pack-year currently smoking OR have quit w/in  15years.) does not qualify.   Lung Cancer Screening Referral: n/a  Additional Screening:  Hepatitis C Screening: does not qualify; Completed 10/2021  Vision Screening: Recommended annual ophthalmology exams for early detection of glaucoma and other disorders of the eye. Is the patient up to date with their annual eye exam?  Yes  Who is the provider or what is the name of the office in  which the patient attends annual eye exams? Currently seeing [facility] but looking for a new practice If pt is not established with a provider, would they like to be referred to a provider to establish care? No .   Dental Screening: Recommended annual dental exams for proper oral hygiene   Community Resource Referral / Chronic Care Management: CRR required this visit?  No   CCM required this visit?  No     Plan:     I have personally reviewed and noted the following in the patient's chart:   Medical and social history Use of alcohol, tobacco or illicit drugs  Current medications and supplements including opioid prescriptions. Patient is not currently taking opioid prescriptions. Functional ability and status Nutritional status Physical activity Advanced directives List of other physicians Hospitalizations, surgeries, and ER visits in previous 12 months Vitals Screenings to include cognitive, depression, and falls Referrals and appointments  In addition, I have reviewed and discussed with patient certain preventive protocols, quality metrics, and best practice recommendations. A written personalized care plan for preventive services as well as general preventive health recommendations were provided to patient.     Marnie Siren, CMA   05/18/2024   After Visit Summary: (In Person-Printed) AVS printed and given to the patient  Nurse Notes: Needs diabetic foot exam at next visit.

## 2024-05-18 NOTE — Progress Notes (Signed)
 BP 109/71   Pulse 68   Ht 5' 10.5" (1.791 m)   Wt 247 lb (112 kg)   BMI 34.94 kg/m    Subjective:    Patient ID: ED MANDICH, male    DOB: 05-05-1956, 68 y.o.   MRN: [id number]  HPI: Steven Vang is a 68 y.o. male presenting on 05/18/2024 for comprehensive medical examination. Current medical complaints include:none  He currently lives with: His Wife Hettie Lota Interim Problems from his last visit: no  HYPERTENSION without Chronic Kidney Disease CHF, CAD. Atherosclerosis.  Has a followed up with [facility] on June 23. Hypertension status: controlled Satisfied with current treatment? no Duration of hypertension: years BP monitoring frequency:  sometimes BP range: 109/70 BP medication side effects:  no Medication compliance: excellent compliance Previous BP meds:Lasix , Metoprolol , losartan  Aspirin : no Recurrent headaches: no Visual changes: no Palpitations: no Dyspnea: no Chest pain: yes- need to follow up with Cardiology. Lower extremity edema: no Dizzy/lightheaded: no   DEPRESSION Feels irritable all the time.  His wife states he stays quick to anger all the time. Mood status: better Satisfied with current treatment?: no Symptom severity: mild  Duration of current treatment : years Side effects: no Medication compliance: good compliance Psychotherapy/counseling: no  Previous psychiatric medications: celexa , wellbutrin  Depressed mood: yes Anxious mood: yes Anhedonia: no Significant weight loss or gain: yes Insomnia: no  Fatigue: yes Feelings of worthlessness or guilt: no Impaired concentration/indecisiveness: no Suicidal ideations: no Hopelessness: no Crying spells: no    01/23/2024    8:19 AM 10/23/2023   10:09 AM 09/15/2023    3:03 PM 09/15/2023    1:50 PM 07/23/2023    9:55 AM  Depression screen PHQ 2/9  Decreased Interest 1 1 1 1 2   Down, Depressed, Hopeless  0 1 1 0  PHQ - 2 Score 1 1 2 2 2   Altered sleeping 0 0 0 0 0  Tired, decreased energy 1 1 1  1 1   Change in appetite 2 1 1 1  0  Feeling bad or failure about yourself  0 0 0 0 0  Trouble concentrating 0 1 1 1 2   Moving slowly or fidgety/restless 0 0 1 1 1   Suicidal thoughts 0 0 0 0 0  PHQ-9 Score 4 4 6 6 6   Difficult doing work/chores   Not difficult at all Not difficult at all Somewhat difficult   DIABETES Ozempic  2mg . Does have some nausea and upset stomach.  Hypoglycemic episodes:no Polydipsia/polyuria: no Visual disturbance: no Chest pain: no Paresthesias: no Glucose Monitoring: yes  Accucheck frequency: sometimes  Fasting glucose: 130-140  Post prandial:  Evening:  Before meals: Taking Insulin ?: no  Long acting insulin :  Short acting insulin : Blood Pressure Monitoring: not checking Retinal Examination: Up to Date Foot Exam: Up to Date Diabetic Education: Not Completed Pneumovax: Up to Date Influenza: Up to Date Aspirin : no  Depression Screen done today and results listed below:     05/18/2024   10:40 AM 01/23/2024    8:19 AM 10/23/2023   10:09 AM 09/15/2023    3:03 PM 09/15/2023    1:50 PM  Depression screen PHQ 2/9  Decreased Interest 1 1 1 1 1   Down, Depressed, Hopeless 0  0 1 1  PHQ - 2 Score 1 1 1 2 2   Altered sleeping 0 0 0 0 0  Tired, decreased energy 0 1 1 1 1   Change in appetite 0 2 1 1 1   Feeling bad or failure about yourself  0 0 0 0 0  Trouble concentrating 0 0 1 1 1   Moving slowly or fidgety/restless 0 0 0 1 1  Suicidal thoughts 0 0 0 0 0  PHQ-9 Score 1 4 4 6 6   Difficult doing work/chores Not difficult at all   Not difficult at all Not difficult at all    The patient does not have a history of falls. I did complete a risk assessment for falls. A plan of care for falls was documented.   Past Medical History:  Past Medical History:  Diagnosis Date   Abnormal EKG    Anxiety disorder    CAD (coronary artery disease)    CHF (congestive heart failure) (HCC)    Chronic systolic heart failure (HCC)    Depression    Diabetes mellitus (HCC)     GERD (gastroesophageal reflux disease)    Hypertension    Lipoma of head 03/31/2020   Mixed hyperlipidemia    Non morbid obesity due to excess calories     Surgical History:  Past Surgical History:  Procedure Laterality Date   ANKLE FRACTURE SURGERY Right    CARDIAC CATHETERIZATION     CHOLECYSTECTOMY     COLONOSCOPY N/A 11/28/2021   Procedure: COLONOSCOPY;  Surgeon: Selena Daily, MD;  Location: [facility];  Service: Gastroenterology;  Laterality: N/A;   CORONARY STENT PLACEMENT  2015   3 stents   HAND SURGERY Right    LEFT HEART CATH AND CORONARY ANGIOGRAPHY Left 06/02/2020   Procedure: LEFT HEART CATH AND CORONARY ANGIOGRAPHY;  Surgeon: Cherrie Cornwall, MD;  Location: [facility]IVE [facility];  Service: Cardiovascular;  Laterality: Left;    Medications:  Current Outpatient Medications on File Prior to Visit  Medication Sig   aspirin  81 MG EC tablet Take 1 tablet by mouth daily.   Blood Glucose Monitoring Suppl (ONE TOUCH ULTRA 2) w/Device KIT 1 each by Does not apply route daily.   buPROPion  (WELLBUTRIN  XL) 300 MG 24 hr tablet TAKE 1 TABLET(300 MG) BY MOUTH DAILY   citalopram  (CELEXA ) 40 MG tablet TAKE 1 TABLET(40 MG) BY MOUTH DAILY   clopidogrel  (PLAVIX ) 75 MG tablet Take 1 tablet (75 mg total) by mouth daily.   dapagliflozin  propanediol (FARXIGA ) 10 MG TABS tablet Take 1 tablet (10 mg total) by mouth daily before breakfast.   furosemide  (LASIX ) 20 MG tablet Take 1 tablet daily   gabapentin  (NEURONTIN ) 300 MG capsule Take 2 capsules (600 mg total) by mouth at bedtime as needed.   glucose blood (ONETOUCH ULTRA) test strip 1 each by Other route daily. Use as instructed   Lancets (ONETOUCH ULTRASOFT) lancets Use as instructed   losartan  (COZAAR ) 100 MG tablet Take 1 tablet (100 mg total) by mouth daily.   metoprolol  succinate (TOPROL -XL) 50 MG 24 hr tablet Take 1 tablet (50 mg total) by mouth daily.   nitroGLYCERIN  (NITROSTAT ) 0.4 MG SL tablet Place 1 tablet (0.4 mg  total) under the tongue every 5 (five) minutes as needed for Chest pain May take up to 3 doses.   omeprazole  (PRILOSEC) 20 MG capsule Take 1 capsule (20 mg total) by mouth daily.   pravastatin  (PRAVACHOL ) 80 MG tablet Take 1 tablet (80 mg total) by mouth once a week. Take 1 tab once weekly   Semaglutide , 2 MG/DOSE, 8 MG/3ML SOPN Inject 2 mg as directed once a week. Do NOT bill insurance, please bill manufacturer voucher:  Bin [id number], PCN [place], Grp [id number], ID [id number]   traZODone  (DESYREL ) 50  MG tablet TAKE 1 TABLET BY MOUTH ONCE DAILY AT BEDTIME AS NEEDED   No current facility-administered medications on file prior to visit.    Allergies:  No Known Allergies  Social History:  Social History   Socioeconomic History   Marital status: Married    Spouse name: Not on file   Number of children: Not on file   Years of education: Not on file   Highest education level: Not on file  Occupational History   Not on file  Tobacco Use   Smoking status: Former    Current packs/day: 0.00    Types: Cigarettes    Quit date: 1990    Years since quitting: 35.4   Smokeless tobacco: Never  Vaping Use   Vaping status: Never Used  Substance and Sexual Activity   Alcohol use: Not Currently   Drug use: Never   Sexual activity: Not on file  Other Topics Concern   Not on file  Social History Narrative   Not on file   Social Drivers of Health   Financial Resource Strain: Not on file  Food Insecurity: Not on file  Transportation Needs: Not on file  Physical Activity: Not on file  Stress: Not on file  Social Connections: Not on file  Intimate Partner Violence: Not on file   Social History   Tobacco Use  Smoking Status Former   Current packs/day: 0.00   Types: Cigarettes   Quit date: 1990   Years since quitting: 35.4  Smokeless Tobacco Never   Social History   Substance and Sexual Activity  Alcohol Use Not Currently    Family History:  Family History  Problem Relation Age  of Onset   CAD Mother    Diabetes Mother    Colon cancer Father    Breast cancer Paternal Grandmother    Stomach cancer Paternal Grandfather     Past medical history, surgical history, medications, allergies, family history and social history reviewed with patient today and changes made to appropriate areas of the chart.   Review of Systems  Eyes:  Negative for blurred vision and double vision.  Respiratory:  Negative for shortness of breath.   Cardiovascular:  Positive for chest pain. Negative for palpitations and leg swelling.  Neurological:  Negative for dizziness and headaches.  Psychiatric/Behavioral:  Positive for depression. Negative for suicidal ideas. The patient is nervous/anxious.    All other ROS negative except what is listed above and in the HPI.      Objective:     BP 109/71   Pulse 68   Ht 5' 10.5" (1.791 m)   Wt 247 lb (112 kg)   BMI 34.94 kg/m   Wt Readings from Last 3 Encounters:  05/18/24 247 lb (112 kg)  01/23/24 247 lb 12.8 oz (112.4 kg)  10/23/23 247 lb 6.4 oz (112.2 kg)    Physical Exam Vitals and nursing note reviewed.  Constitutional:      General: He is not in acute distress.    Appearance: Normal appearance. He is not ill-appearing, toxic-appearing or diaphoretic.  HENT:     Head: Normocephalic.     Right Ear: Tympanic membrane, ear canal and external ear normal.     Left Ear: Tympanic membrane, ear canal and external ear normal.     Nose: Nose normal. No congestion or rhinorrhea.     Mouth/Throat:     Mouth: Mucous membranes are moist.  Eyes:     General:  Right eye: No discharge.        Left eye: No discharge.     Extraocular Movements: Extraocular movements intact.     Conjunctiva/sclera: Conjunctivae normal.     Pupils: Pupils are equal, round, and reactive to light.  Cardiovascular:     Rate and Rhythm: Normal rate and regular rhythm.     Heart sounds: No murmur heard. Pulmonary:     Effort: Pulmonary effort is normal. No  respiratory distress.     Breath sounds: Normal breath sounds. No wheezing, rhonchi or rales.  Abdominal:     General: Abdomen is flat. Bowel sounds are normal. There is no distension.     Palpations: Abdomen is soft.     Tenderness: There is no abdominal tenderness. There is no guarding.  Musculoskeletal:     Cervical back: Normal range of motion and neck supple.  Skin:    General: Skin is warm and dry.     Capillary Refill: Capillary refill takes less than 2 seconds.  Neurological:     General: No focal deficit present.     Mental Status: He is alert and oriented to person, place, and time.     Cranial Nerves: No cranial nerve deficit.     Motor: No weakness.     Deep Tendon Reflexes: Reflexes normal.  Psychiatric:        Mood and Affect: Mood normal.        Behavior: Behavior normal.        Thought Content: Thought content normal.        Judgment: Judgment normal.     Results for orders placed or performed in visit on 01/28/24  Comp Met (CMET)   Collection Time: 01/28/24  8:53 AM  Result Value Ref Range   Glucose 143 (H) 70 - 99 mg/dL   BUN 13 8 - 27 mg/dL   Creatinine, Ser 1.61 (H) 0.76 - 1.27 mg/dL   eGFR 51 (L) >09 UE/AVW/0.98   BUN/Creatinine Ratio 9 (L) 10 - 24   Sodium 139 134 - 144 mmol/L   Potassium 4.7 3.5 - 5.2 mmol/L   Chloride 104 96 - 106 mmol/L   CO2 23 20 - 29 mmol/L   Calcium 9.1 8.6 - 10.2 mg/dL   Total Protein 6.7 6.0 - 8.5 g/dL   Albumin  4.2 3.9 - 4.9 g/dL   Globulin, Total 2.5 1.5 - 4.5 g/dL   Bilirubin Total 0.2 0.0 - 1.2 mg/dL   Alkaline Phosphatase 82 44 - 121 IU/L   AST 20 0 - 40 IU/L   ALT 13 0 - 44 IU/L      Assessment & Plan:   Problem List Items Addressed This Visit       Cardiovascular and Mediastinum   Essential hypertension   Chronic, historic condition Appears well controlled with current regimen comprised of Losartan  50 mg PO every day, Metoprolol  50 mg PO every day,  Continue current regimen Continue to check BP at home  for monitoring  Follow up in 3 months or sooner if concerns arise       CHF (congestive heart failure) (HCC)   Followed by Cardiology.  Most recent note reviewed.  Continue to follow up with specialist.        Endocrine   Hyperlipidemia associated with type 2 diabetes mellitus (HCC)   Chronic.  Controlled.  Continue with current medication regimen.  Labs ordered today.  Return to clinic in 6 months for reevaluation.  Call sooner if concerns  arise.        Relevant Orders   Lipid panel   Type 2 diabetes mellitus with diabetic neuropathy, unspecified (HCC)   Chronic.  Controlled at 5.8%. Doing well with Ozempic  2mg .  Weight is the same.  Does have some GI upset at times with the medication.  Cost is no longer an issue. Continue with other medications.  Labs ordered today.  Return to clinic in 3 months for reevaluation.  Call sooner if concerns arise.       Relevant Orders   Hemoglobin A1c     Genitourinary   CKD (chronic kidney disease), stage III (HCC)   Chronic.  Controlled.  Continue with current medication regimen.  Labs ordered today.  Return to clinic in 6 months for reevaluation.  Call sooner if concerns arise.          Other   Mild episode of recurrent major depressive disorder (HCC)   Chronic. Not well controlled.  Continue with Celexa  and Wellbutrin .  Will add Abilify 2mg  daily.  Side effects and benefits of medication discussed.  Can wean down on Celexa  if symptoms improve.  Follow up in 3 months.  Call sooner if concerns arise.       Morbid obesity (HCC)   Recommended eating smaller high protein, low fat meals more frequently and exercising 30 mins a day 5 times a week with a goal of 10-15lb weight loss in the next 3 months.        Advanced care planning/counseling discussion   A voluntary discussion about advance care planning including the explanation and discussion of advance directives was extensively discussed  with the patient for 5 minutes with patient.   Explanation about the health care proxy and Living will was reviewed and packet with forms with explanation of how to fill them out was given.  During this discussion, the patient was able to identify a health care proxy as his wife, Hettie Lota, and plans to fill out the paperwork required.  Patient was offered a separate Advance Care Planning visit for further assistance with forms.         Other Visit Diagnoses       Encounter for Medicare annual wellness exam    -  Primary     Annual physical exam       Health maintenance reviewed during visit today.  Labs ordered.  Vaccines reviewed.  Needs updated eye exam.  Colonoscopy up to date.   Relevant Orders   Hemoglobin A1c   TSH   PSA   Lipid panel   CBC with Differential/Platelet   Comprehensive metabolic panel with GFR        Discussed aspirin  prophylaxis for myocardial infarction prevention and decision was it was made to continue.  LABORATORY TESTING:  Health maintenance labs ordered today as discussed above.   The natural history of prostate cancer and ongoing controversy regarding screening and potential treatment outcomes of prostate cancer has been discussed with the patient. The meaning of a false positive PSA and a false negative PSA has been discussed. He indicates understanding of the limitations of this screening test and wishes to proceed with screening PSA testing.   IMMUNIZATIONS:   - Tdap: Tetanus vaccination status reviewed: On Medicare. - Influenza: Postponed to flu season - Pneumovax: Up to date - Prevnar: Up to date - COVID: Up to date - HPV: Not applicable - Shingrix vaccine: Up to date  SCREENING: - Colonoscopy: Up to date  Discussed with patient purpose  of the colonoscopy is to detect colon cancer at curable precancerous or early stages   - AAA Screening: Up to date  -Hearing Test: Not applicable  -Spirometry: Not applicable   PATIENT COUNSELING:    Sexuality: Discussed sexually transmitted diseases,  partner selection, use of condoms, avoidance of unintended pregnancy  and contraceptive alternatives.   Advised to avoid cigarette smoking.  I discussed with the patient that most people either abstain from alcohol or drink within safe limits (<=14/week and <=4 drinks/occasion for males, <=7/weeks and <= 3 drinks/occasion for females) and that the risk for alcohol disorders and other health effects rises proportionally with the number of drinks per week and how often a drinker exceeds daily limits.  Discussed cessation/primary prevention of drug use and availability of treatment for abuse.   Diet: Encouraged to adjust caloric intake to maintain  or achieve ideal body weight, to reduce intake of dietary saturated fat and total fat, to limit sodium intake by avoiding high sodium foods and not adding table salt, and to maintain adequate dietary potassium and calcium preferably from fresh fruits, vegetables, and low-fat dairy products.    stressed the importance of regular exercise  Injury prevention: Discussed safety belts, safety helmets, smoke detector, smoking near bedding or upholstery.   Dental health: Discussed importance of regular tooth brushing, flossing, and dental visits.   Follow up plan: NEXT PREVENTATIVE PHYSICAL DUE IN 1 YEAR. Return in about 3 months (around 08/18/2024) for HTN, HLD, DM2 FU.

## 2024-05-18 NOTE — Assessment & Plan Note (Signed)
 Chronic. Not well controlled.  Continue with Celexa  and Wellbutrin .  Will add Abilify 2mg  daily.  Side effects and benefits of medication discussed.  Can wean down on Celexa  if symptoms improve.  Follow up in 3 months.  Call sooner if concerns arise.

## 2024-05-19 ENCOUNTER — Ambulatory Visit: Payer: Self-pay | Admitting: Nurse Practitioner

## 2024-05-19 LAB — COMPREHENSIVE METABOLIC PANEL WITH GFR
ALT: 15 IU/L (ref 0–44)
AST: 23 IU/L (ref 0–40)
Albumin: 4.2 g/dL (ref 3.9–4.9)
Alkaline Phosphatase: 86 IU/L (ref 44–121)
BUN/Creatinine Ratio: 10 (ref 10–24)
BUN: 15 mg/dL (ref 8–27)
Bilirubin Total: 0.3 mg/dL (ref 0.0–1.2)
CO2: 21 mmol/L (ref 20–29)
Calcium: 8.8 mg/dL (ref 8.6–10.2)
Chloride: 104 mmol/L (ref 96–106)
Creatinine, Ser: 1.57 mg/dL — ABNORMAL HIGH (ref 0.76–1.27)
Globulin, Total: 2.4 g/dL (ref 1.5–4.5)
Glucose: 118 mg/dL — ABNORMAL HIGH (ref 70–99)
Potassium: 4.3 mmol/L (ref 3.5–5.2)
Sodium: 139 mmol/L (ref 134–144)
Total Protein: 6.6 g/dL (ref 6.0–8.5)
eGFR: 48 mL/min/{1.73_m2} — ABNORMAL LOW (ref >59)

## 2024-05-19 LAB — CBC WITH DIFFERENTIAL/PLATELET
Basophils Absolute: 0.1 10*3/uL (ref 0.0–0.2)
Basos: 1 %
EOS (ABSOLUTE): 0.6 10*3/uL — ABNORMAL HIGH (ref 0.0–0.4)
Eos: 8 %
Hematocrit: 43.3 % (ref 37.5–51.0)
Hemoglobin: 13.9 g/dL (ref 13.0–17.7)
Immature Grans (Abs): 0 10*3/uL (ref 0.0–0.1)
Immature Granulocytes: 0 %
Lymphocytes Absolute: 2 10*3/uL (ref 0.7–3.1)
Lymphs: 27 %
MCH: 26.5 pg — ABNORMAL LOW (ref 26.6–33.0)
MCHC: 32.1 g/dL (ref 31.5–35.7)
MCV: 83 fL (ref 79–97)
Monocytes Absolute: 0.6 10*3/uL (ref 0.1–0.9)
Monocytes: 8 %
Neutrophils Absolute: 4 10*3/uL (ref 1.4–7.0)
Neutrophils: 56 %
Platelets: 157 10*3/uL (ref 150–450)
RBC: 5.24 x10E6/uL (ref 4.14–5.80)
RDW: 13.4 % (ref 11.6–15.4)
WBC: 7.2 10*3/uL (ref 3.4–10.8)

## 2024-05-19 LAB — LIPID PANEL
Chol/HDL Ratio: 6.8 ratio — ABNORMAL HIGH (ref 0.0–5.0)
Cholesterol, Total: 210 mg/dL — ABNORMAL HIGH (ref 100–199)
HDL: 31 mg/dL — ABNORMAL LOW (ref >39)
LDL Chol Calc (NIH): 138 mg/dL — ABNORMAL HIGH (ref 0–99)
Triglycerides: 229 mg/dL — ABNORMAL HIGH (ref 0–149)
VLDL Cholesterol Cal: 41 mg/dL — ABNORMAL HIGH (ref 5–40)

## 2024-05-19 LAB — PSA: Prostate Specific Ag, Serum: 2.5 ng/mL (ref 0.0–4.0)

## 2024-05-19 LAB — TSH: TSH: 2.59 u[IU]/mL (ref 0.450–4.500)

## 2024-05-19 LAB — HEMOGLOBIN A1C
Est. average glucose Bld gHb Est-mCnc: 120 mg/dL
Hgb A1c MFr Bld: 5.8 % — ABNORMAL HIGH (ref 4.8–5.6)

## 2024-05-21 ENCOUNTER — Telehealth: Payer: Self-pay

## 2024-05-21 NOTE — Telephone Encounter (Signed)
 Copied from CRM [id number]. Topic: Clinical - Medication Prior Auth >> May 21, 2024 10:04 AM Loreda Rodriguez T wrote: Reason for CRM: Patients spouse Hettie Lota called to f/u on the PA for ARIPiprazole (ABILIFY) 2 MG tablet. Please f/u with patient

## 2024-05-24 ENCOUNTER — Telehealth: Payer: Self-pay

## 2024-05-24 ENCOUNTER — Other Ambulatory Visit (HOSPITAL_COMMUNITY): Payer: Self-pay

## 2024-05-24 NOTE — Telephone Encounter (Signed)
 Pharmacy Patient Advocate Encounter   Received notification from Pt Calls Messages that prior authorization for ARIPiprazole  2MG  tablets is required/requested.   Insurance verification completed.   The patient is insured through [facility] .   Per test claim: PA required; PA submitted to above mentioned insurance via CoverMyMeds Key/confirmation #/EOC [id number] Status is pending

## 2024-05-24 NOTE — Telephone Encounter (Signed)
 PA request has been Submitted. New Encounter has been or will be created for follow up. For additional info see Pharmacy Prior Auth telephone encounter from 05/24/2024.

## 2024-05-25 ENCOUNTER — Other Ambulatory Visit (HOSPITAL_COMMUNITY): Payer: Self-pay

## 2024-05-25 NOTE — Telephone Encounter (Signed)
 Called and notified patient of approval and cost of medication.

## 2024-05-25 NOTE — Telephone Encounter (Signed)
 PA request has been Approved. Med should be $18.00 for a 90 day supply.

## 2024-05-25 NOTE — Telephone Encounter (Signed)
 Pharmacy Patient Advocate Encounter  Received notification from [facility] that Prior Authorization for ARIPiprazole  2MG  tablets has been APPROVED from 05/24/2024 to 05/24/2025. Ran test claim, Copay is $18.00. This test claim was processed through [facility] copay amounts may vary at other pharmacies due to pharmacy/plan contracts, or as the patient moves through the different stages of their insurance plan.   PA #/Case ID/Reference #: [id number]

## 2024-06-02 ENCOUNTER — Other Ambulatory Visit: Payer: Self-pay | Admitting: Nurse Practitioner

## 2024-06-02 DIAGNOSIS — F330 Major depressive disorder, recurrent, mild: Secondary | ICD-10-CM

## 2024-06-04 NOTE — Telephone Encounter (Signed)
 Requested Prescriptions  Pending Prescriptions Disp Refills   traZODone  (DESYREL ) 50 MG tablet [Pharmacy Med Name: TRAZODONE  50MG  TABLETS] 90 tablet 1    Sig: TAKE 1 TABLET BY MOUTH DAILY AT BEDTIME AS NEEDED     Psychiatry: Antidepressants - Serotonin Modulator Passed - 06/04/2024  2:18 PM      Passed - Completed PHQ-2 or PHQ-9 in the last 360 days      Passed - Valid encounter within last 6 months    Recent Outpatient Visits           2 weeks ago Encounter for Medicare annual wellness exam   [place] [facility] Aileen Alexanders, NP   4 months ago Mild episode of recurrent major depressive disorder [facility])   [place] [facility] Aileen Alexanders, NP

## 2024-07-07 DIAGNOSIS — F3342 Major depressive disorder, recurrent, in full remission: Secondary | ICD-10-CM | POA: Diagnosis not present

## 2024-07-07 DIAGNOSIS — G8929 Other chronic pain: Secondary | ICD-10-CM | POA: Diagnosis not present

## 2024-07-26 DIAGNOSIS — I2510 Atherosclerotic heart disease of native coronary artery without angina pectoris: Secondary | ICD-10-CM | POA: Diagnosis not present

## 2024-07-26 DIAGNOSIS — E782 Mixed hyperlipidemia: Secondary | ICD-10-CM | POA: Diagnosis not present

## 2024-07-26 DIAGNOSIS — Z955 Presence of coronary angioplasty implant and graft: Secondary | ICD-10-CM | POA: Diagnosis not present

## 2024-07-26 DIAGNOSIS — I10 Essential (primary) hypertension: Secondary | ICD-10-CM | POA: Diagnosis not present

## 2024-08-23 ENCOUNTER — Other Ambulatory Visit: Payer: Self-pay | Admitting: Nurse Practitioner

## 2024-08-23 DIAGNOSIS — E1140 Type 2 diabetes mellitus with diabetic neuropathy, unspecified: Secondary | ICD-10-CM

## 2024-08-23 DIAGNOSIS — K219 Gastro-esophageal reflux disease without esophagitis: Secondary | ICD-10-CM

## 2024-08-23 DIAGNOSIS — I10 Essential (primary) hypertension: Secondary | ICD-10-CM

## 2024-08-23 DIAGNOSIS — F330 Major depressive disorder, recurrent, mild: Secondary | ICD-10-CM

## 2024-08-23 DIAGNOSIS — I509 Heart failure, unspecified: Secondary | ICD-10-CM

## 2024-08-23 NOTE — Telephone Encounter (Unsigned)
 Copied from CRM #[id number]. Topic: Clinical - Medication Refill >> Aug 23, 2024  9:08 AM Ahlexyia S wrote: Medication:  ARIPiprazole  (ABILIFY ) 2 MG tablet buPROPion  (WELLBUTRIN  XL) 300 MG 24 hr tablet citalopram  (CELEXA ) 40 MG tablet clopidogrel  (PLAVIX ) 75 MG tablet  furosemide  (LASIX ) 20 MG tablet  gabapentin  (NEURONTIN ) 300 MG capsule  losartan  (COZAAR ) 50 MG tablet metoprolol  succinate (TOPROL -XL) 50 MG 24 hr tablet omeprazole  (PRILOSEC) 20 MG capsule  traZODone  (DESYREL ) 50 MG tablet   Has the patient contacted their pharmacy? Yes, pharmacy stated that pt has no refills (Agent: If no, request that the patient contact the pharmacy for the refill. If patient does not wish to contact the pharmacy document the reason why and proceed with request.) (Agent: If yes, when and what did the pharmacy advise?)  This is the patient's preferred pharmacy:  [id number] [place], [place] - [address] AT [facility] OF [address] [place] [address] Phone: [phone] Fax: [phone]  Is this the correct pharmacy for this prescription? Yes If no, delete pharmacy and type the correct one.   Has the prescription been filled recently? No  Is the patient out of the medication? Yes  Has the patient been seen for an appointment in the last year OR does the patient have an upcoming appointment? Yes  Can we respond through MyChart? No, pt prefers phone calls.  Agent: Please be advised that Rx refills may take up to 3 business days. We ask that you follow-up with your pharmacy.

## 2024-08-24 ENCOUNTER — Ambulatory Visit: Admitting: Nurse Practitioner

## 2024-08-24 ENCOUNTER — Other Ambulatory Visit: Payer: Self-pay | Admitting: Nurse Practitioner

## 2024-08-24 ENCOUNTER — Encounter: Payer: Self-pay | Admitting: Nurse Practitioner

## 2024-08-24 VITALS — BP 134/72 | HR 71 | Temp 98.0°F | Ht 70.5 in | Wt 249.0 lb

## 2024-08-24 DIAGNOSIS — N1831 Chronic kidney disease, stage 3a: Secondary | ICD-10-CM

## 2024-08-24 DIAGNOSIS — E782 Mixed hyperlipidemia: Secondary | ICD-10-CM

## 2024-08-24 DIAGNOSIS — I700 Atherosclerosis of aorta: Secondary | ICD-10-CM

## 2024-08-24 DIAGNOSIS — Z7985 Long-term (current) use of injectable non-insulin antidiabetic drugs: Secondary | ICD-10-CM

## 2024-08-24 DIAGNOSIS — I509 Heart failure, unspecified: Secondary | ICD-10-CM

## 2024-08-24 DIAGNOSIS — E1140 Type 2 diabetes mellitus with diabetic neuropathy, unspecified: Secondary | ICD-10-CM

## 2024-08-24 DIAGNOSIS — Z23 Encounter for immunization: Secondary | ICD-10-CM

## 2024-08-24 DIAGNOSIS — F330 Major depressive disorder, recurrent, mild: Secondary | ICD-10-CM | POA: Diagnosis not present

## 2024-08-24 MED ORDER — FUROSEMIDE 20 MG PO TABS: ORAL_TABLET | ORAL | 2 refills | Status: AC

## 2024-08-24 MED ORDER — METOPROLOL SUCCINATE ER 50 MG PO TB24: 50.0000 mg | ORAL_TABLET | Freq: Every day | ORAL | 2 refills | Status: AC

## 2024-08-24 MED ORDER — GABAPENTIN 300 MG PO CAPS
600.0000 mg | ORAL_CAPSULE | Freq: Every evening | ORAL | 1 refills | Status: AC | PRN
Start: 2024-08-24 — End: ?

## 2024-08-24 MED ORDER — TRIAMCINOLONE ACETONIDE 0.1 % EX CREA: 1.0000 | TOPICAL_CREAM | Freq: Two times a day (BID) | CUTANEOUS | 0 refills | Status: AC

## 2024-08-24 MED ORDER — CITALOPRAM HYDROBROMIDE 40 MG PO TABS: 40.0000 mg | ORAL_TABLET | Freq: Every day | ORAL | 2 refills | Status: AC

## 2024-08-24 MED ORDER — OMEPRAZOLE 20 MG PO CPDR
20.0000 mg | DELAYED_RELEASE_CAPSULE | Freq: Every day | ORAL | 2 refills | Status: AC
Start: 2024-08-24 — End: ?

## 2024-08-24 MED ORDER — CLOPIDOGREL BISULFATE 75 MG PO TABS: 75.0000 mg | ORAL_TABLET | Freq: Every day | ORAL | 2 refills | Status: AC

## 2024-08-24 MED ORDER — BUPROPION HCL ER (XL) 300 MG PO TB24: 300.0000 mg | ORAL_TABLET | Freq: Every day | ORAL | 2 refills | Status: AC

## 2024-08-24 MED ORDER — ARIPIPRAZOLE 2 MG PO TABS: 2.0000 mg | ORAL_TABLET | Freq: Every day | ORAL | 1 refills | Status: DC

## 2024-08-24 NOTE — Assessment & Plan Note (Signed)
 Followed by Cardiology.  Most recent note reviewed.  Continue to follow up with specialist.

## 2024-08-24 NOTE — Assessment & Plan Note (Signed)
Chronic.  Controlled.  Continue with current medication regimen of Pravastatin.  Labs ordered today.  Return to clinic in 6 months for reevaluation.  Call sooner if concerns arise.   

## 2024-08-24 NOTE — Assessment & Plan Note (Signed)
 Recommended eating smaller high protein, low fat meals more frequently and exercising 30 mins a day 5 times a week with a goal of 10-15lb weight loss in the next 3 months.  Continue with Ozempic.

## 2024-08-24 NOTE — Assessment & Plan Note (Signed)
 Chronic.  Controlled.  Continue with current medication regimen.  Continue with Plavix .  On Pravastatin  weekly- doesn't tolerate more than weekly.  Labs ordered today.  Return to clinic in 6 months for reevaluation.  Call sooner if concerns arise.

## 2024-08-24 NOTE — Telephone Encounter (Signed)
 Requested medication (s) are due for refill today: yes  Requested medication (s) are on the active medication list: yes  Last refill:  05/18/24  Future visit scheduled: yes  Notes to clinic:  Unable to refill per protocol, cannot delegate.      Requested Prescriptions  Pending Prescriptions Disp Refills   ARIPiprazole  (ABILIFY ) 2 MG tablet 90 tablet 0    Sig: Take 1 tablet (2 mg total) by mouth daily.     Not Delegated - Psychiatry:  Antipsychotics - Second Generation (Atypical) - aripiprazole  Failed - 08/24/2024 12:36 PM      Failed - This refill cannot be delegated      Failed - Lipid Panel in normal range within the last 12 months    Cholesterol, Total  Date Value Ref Range Status  05/18/2024 210 (H) 100 - 199 mg/dL Final   LDL Chol Calc (NIH)  Date Value Ref Range Status  05/18/2024 138 (H) 0 - 99 mg/dL Final   HDL  Date Value Ref Range Status  05/18/2024 31 (L) >39 mg/dL Final   Triglycerides  Date Value Ref Range Status  05/18/2024 229 (H) 0 - 149 mg/dL Final         Passed - TSH in normal range and within 360 days    TSH  Date Value Ref Range Status  05/18/2024 2.590 0.450 - 4.500 uIU/mL Final         Passed - Completed PHQ-2 or PHQ-9 in the last 360 days      Passed - Last BP in normal range    BP Readings from Last 1 Encounters:  08/24/24 134/72         Passed - Last Heart Rate in normal range    Pulse Readings from Last 1 Encounters:  08/24/24 71         Passed - Valid encounter within last 6 months    Recent Outpatient Visits           Today Type 2 diabetes mellitus with diabetic neuropathy, without long-term current use of insulin  (HCC)   [place] [facility] Melvin Pao, NP   3 months ago Encounter for [employer] annual wellness exam   [place] [facility]vin [facility], NP   7 months ago Mild episode of recurrent major depressive disorder [facility])   [place] [facility] Melvin Pao, NP              Passed - CBC within normal limits and completed in the last 12 months    WBC  Date Value Ref Range Status  05/18/2024 7.2 3.4 - 10.8 x10E3/uL Final  09/06/2023 7.8 4.0 - 10.5 K/uL Final   RBC  Date Value Ref Range Status  05/18/2024 5.24 4.14 - 5.80 x10E6/uL Final  09/06/2023 5.05 4.22 - 5.81 MIL/uL Final   Hemoglobin  Date Value Ref Range Status  05/18/2024 13.9 13.0 - 17.7 g/dL Final   Hematocrit  Date Value Ref Range Status  05/18/2024 43.3 37.5 - 51.0 % Final   MCHC  Date Value Ref Range Status  05/18/2024 32.1 31.5 - 35.7 g/dL Final  90/78/7975 66.2 30.0 - 36.0 g/dL Final   [facility]  Date Value Ref Range Status  05/18/2024 26.5 (L) 26.6 - 33.0 pg Final  09/06/2023 26.5 26.0 - 34.0 pg Final   MCV  Date Value Ref Range Status  05/18/2024 83 79 - 97 fL Final  07/01/2014 82 80 - 100 fL Final  No results found for: PLTCOUNTKUC, LABPLAT, POCPLA RDW  Date Value Ref Range Status  05/18/2024 13.4 11.6 - 15.4 % Final  07/01/2014 14.8 (H) 11.5 - 14.5 % Final         Passed - CMP within normal limits and completed in the last 12 months    Albumin   Date Value Ref Range Status  05/18/2024 4.2 3.9 - 4.9 g/dL Final   Alkaline Phosphatase  Date Value Ref Range Status  05/18/2024 86 44 - 121 IU/L Final   ALT  Date Value Ref Range Status  05/18/2024 15 0 - 44 IU/L Final   AST  Date Value Ref Range Status  05/18/2024 23 0 - 40 IU/L Final   BUN  Date Value Ref Range Status  05/18/2024 15 8 - 27 mg/dL Final  92/68/7984 14 7 - 18 mg/dL Final   Calcium  Date Value Ref Range Status  05/18/2024 8.8 8.6 - 10.2 mg/dL Final   Calcium, Total  Date Value Ref Range Status  07/15/2014 8.3 (L) 8.5 - 10.1 mg/dL Final   CO2  Date Value Ref Range Status  05/18/2024 21 20 - 29 mmol/L Final   Co2  Date Value Ref Range Status  07/15/2014 27 21 - 32 mmol/L Final   Bicarbonate  Date Value Ref Range Status  09/27/2020 26.6 20.0 - 28.0 mmol/L  Final   Creatinine  Date Value Ref Range Status  07/15/2014 1.07 0.60 - 1.30 mg/dL Final   Creatinine, Ser  Date Value Ref Range Status  05/18/2024 1.57 (H) 0.76 - 1.27 mg/dL Final   Glucose  Date Value Ref Range Status  05/18/2024 118 (H) 70 - 99 mg/dL Final  92/68/7984 893 (H) 65 - 99 mg/dL Final   Glucose, Bld  Date Value Ref Range Status  09/06/2023 128 (H) 70 - 99 mg/dL Final    Comment:    Glucose reference range applies only to samples taken after fasting for at least 8 hours.   Glucose-Capillary  Date Value Ref Range Status  10/19/2020 102 (H) 70 - 99 mg/dL Final    Comment:    Glucose reference range applies only to samples taken after fasting for at least 8 hours.   Potassium  Date Value Ref Range Status  05/18/2024 4.3 3.5 - 5.2 mmol/L Final  07/15/2014 4.4 3.5 - 5.1 mmol/L Final   Sodium  Date Value Ref Range Status  05/18/2024 139 134 - 144 mmol/L Final  07/15/2014 144 136 - 145 mmol/L Final   Bilirubin Total  Date Value Ref Range Status  05/18/2024 0.3 0.0 - 1.2 mg/dL Final   Protein, ur  Date Value Ref Range Status  10/15/2020 30 (A) NEGATIVE mg/dL Final   Protein,UA  Date Value Ref Range Status  10/21/2022 Negative Negative/Trace Final   Total Protein  Date Value Ref Range Status  05/18/2024 6.6 6.0 - 8.5 g/dL Final   EGFR (African American)  Date Value Ref Range Status  07/15/2014 >60  Final   GFR calc Af Amer  Date Value Ref Range Status  01/26/2021 51 (L) >59 mL/min/1.73 Final    Comment:    **In accordance with recommendations from the NKF-ASN Task force,**   [facility] is in the process of updating its eGFR calculation to the   2021 CKD-EPI creatinine equation that estimates kidney function   without a race variable.    eGFR  Date Value Ref Range Status  05/18/2024 48 (L) >59 mL/min/1.73 Final   EGFR (Non-African Amer.)  Date Value Ref Range Status  07/15/2014 >60  Final    Comment:    eGFR values <48mL/min/1.73 m2 may be an  indication of chronic kidney disease (CKD). Calculated eGFR is useful in patients with stable renal function. The eGFR calculation will not be reliable in acutely ill patients when serum creatinine is changing rapidly. It is not useful in  patients on dialysis. The eGFR calculation may not be applicable to patients at the low and high extremes of body sizes, pregnant women, and vegetarians.    GFR, Estimated  Date Value Ref Range Status  09/06/2023 53 (L) >60 mL/min Final    Comment:    (NOTE) Calculated using the CKD-EPI Creatinine Equation (2021)          Signed Prescriptions Disp Refills   buPROPion  (WELLBUTRIN  XL) 300 MG 24 hr tablet 90 tablet 2    Sig: Take 1 tablet (300 mg total) by mouth daily.     Psychiatry: Antidepressants - bupropion  Failed - 08/24/2024 12:36 PM      Failed - Cr in normal range and within 360 days    Creatinine  Date Value Ref Range Status  07/15/2014 1.07 0.60 - 1.30 mg/dL Final   Creatinine, Ser  Date Value Ref Range Status  05/18/2024 1.57 (H) 0.76 - 1.27 mg/dL Final         Passed - AST in normal range and within 360 days    AST  Date Value Ref Range Status  05/18/2024 23 0 - 40 IU/L Final         Passed - ALT in normal range and within 360 days    ALT  Date Value Ref Range Status  05/18/2024 15 0 - 44 IU/L Final         Passed - Completed PHQ-2 or PHQ-9 in the last 360 days      Passed - Last BP in normal range    BP Readings from Last 1 Encounters:  08/24/24 134/72         Passed - Valid encounter within last 6 months    Recent Outpatient Visits           Today Type 2 diabetes mellitus with diabetic neuropathy, without long-term current use of insulin  [facility])   [place] [facility] Melvin Pao, NP   3 months ago Encounter for [employer] annual wellness exam   [place] [facility] Melvin Pao, NP   7 months ago Mild episode of recurrent major depressive disorder [facility])   [place]  [facility] Melvin Pao, NP               citalopram  (CELEXA ) 40 MG tablet 90 tablet 2    Sig: Take 1 tablet (40 mg total) by mouth daily.     Psychiatry:  Antidepressants - SSRI Passed - 08/24/2024 12:36 PM      Passed - Completed PHQ-2 or PHQ-9 in the last 360 days      Passed - Valid encounter within last 6 months    Recent Outpatient Visits           Today Type 2 diabetes mellitus with diabetic neuropathy, without long-term current use of insulin  [facility])   [place] [facility] Melvin Pao, NP   3 months ago Encounter for [employer] annual wellness exam   [place] [facility] Melvin Pao, NP   7 months ago Mild episode of recurrent major depressive disorder [facility])   [place]  [facility] Melvin Pao, NP               clopidogrel  (PLAVIX ) 75 MG tablet 90 tablet 2    Sig: Take 1 tablet (75 mg total) by mouth daily.     Hematology: Antiplatelets - clopidogrel  Failed - 08/24/2024 12:36 PM      Failed - Cr in normal range and within 360 days    Creatinine  Date Value Ref Range Status  07/15/2014 1.07 0.60 - 1.30 mg/dL Final   Creatinine, Ser  Date Value Ref Range Status  05/18/2024 1.57 (H) 0.76 - 1.27 mg/dL Final         Passed - HCT in normal range and within 180 days    Hematocrit  Date Value Ref Range Status  05/18/2024 43.3 37.5 - 51.0 % Final         Passed - HGB in normal range and within 180 days    Hemoglobin  Date Value Ref Range Status  05/18/2024 13.9 13.0 - 17.7 g/dL Final         Passed - PLT in normal range and within 180 days    Platelets  Date Value Ref Range Status  05/18/2024 157 150 - 450 x10E3/uL Final         Passed - Valid encounter within last 6 months    Recent Outpatient Visits           Today Type 2 diabetes mellitus with diabetic neuropathy, without long-term current use of insulin  (HCC)   [place] [facility] Melvin Pao, NP    3 months ago Encounter for [employer] annual wellness exam   [place] [facility] Melvin Pa[facility], NP   7 months ago Mild episode of recurrent major depressive disorder [facility])   [place] [facility] Melvin Pao, NP               furosemide  (LASIX ) 20 MG tablet 90 tablet 2    Sig: Take 1 tablet daily     Cardiovascular:  Diuretics - Loop Failed - 08/24/2024 12:36 PM      Failed - Cr in normal range and within 180 days    Creatinine  Date Value Ref Range Status  07/15/2014 1.07 0.60 - 1.30 mg/dL Final   Creatinine, Ser  Date Value Ref Range Status  05/18/2024 1.57 (H) 0.76 - 1.27 mg/dL Final         Failed - Mg Level in normal range and within 180 days    Magnesium  Date Value Ref Range Status  10/19/2020 2.2 1.7 - 2.4 mg/dL Final    Comment:    Performed at [facility], [address] [place], [place] [address]         Passed - K in normal range and within 180 days    Potassium  Date Value Ref Range Status  05/18/2024 4.3 3.5 - 5.2 mmol/L Final  07/15/2014 4.4 3.5 - 5.1 mmol/L Final         Passed - Ca in normal range and within 180 days    Calcium  Date Value Ref Range Status  05/18/2024 8.8 8.6 - 10.2 mg/dL Final   Calcium, Total  Date Value Ref Range Status  07/15/2014 8.3 (L) 8.5 - 10.1 mg/dL Final         Passed - Na in normal range and within 180 days    Sodium  Date Value Ref Range Status  05/18/2024 139 134 -  144 mmol/L Final  07/15/2014 144 136 - 145 mmol/L Final         Passed - Cl in normal range and within 180 days    Chloride  Date Value Ref Range Status  05/18/2024 104 96 - 106 mmol/L Final  07/15/2014 111 (H) 98 - 107 mmol/L Final         Passed - Last BP in normal range    BP Readings from Last 1 Encounters:  08/24/24 134/72         Passed - Valid encounter within last 6 months    Recent Outpatient Visits           Today Type 2 diabetes mellitus with diabetic neuropathy, without  long-term current use of insulin  [facility])   [place] [facility] Melvin Pao, NP   3 months ago Encounter for [employer] annual wellness exam   [place] [facility] Melvin Pao, NP   7 months ago Mild episode of recurrent major depressive disorder [facility])   [place] [facility] Melvin Pao, NP               gabapentin  (NEURONTIN ) 300 MG capsule 180 capsule 1    Sig: Take 2 capsules (600 mg total) by mouth at bedtime as needed.     Neurology: Anticonvulsants - gabapentin  Failed - 08/24/2024 12:36 PM      Failed - Cr in normal range and within 360 days    Creatinine  Date Value Ref Range Status  07/15/2014 1.07 0.60 - 1.30 mg/dL Final   Creatinine, Ser  Date Value Ref Range Status  05/18/2024 1.57 (H) 0.76 - 1.27 mg/dL Final         Passed - Completed PHQ-2 or PHQ-9 in the last 360 days      Passed - Valid encounter within last 12 months    Recent Outpatient Visits           Today Type 2 diabetes mellitus with diabetic neuropathy, without long-term current use of insulin  [facility])   [place] [facility] Melvin Pao, NP   3 months ago Encounter for [employer] annual wellness exam   [place] [facility] Melvin Pa[facility], NP   7 months ago Mild episode of recurrent major depressive disorder [facility])   [place] [facility] Melvin Pao, NP               metoprolol  succinate (TOPROL -XL) 50 MG 24 hr tablet 90 tablet 2    Sig: Take 1 tablet (50 mg total) by mouth daily.     Cardiovascular:  Beta Blockers Passed - 08/24/2024 12:36 PM      Passed - Last BP in normal range    BP Readings from Last 1 Encounters:  08/24/24 134/72         Passed - Last Heart Rate in normal range    Pulse Readings from Last 1 Encounters:  08/24/24 71         Passed - Valid encounter within last 6 months    Recent Outpatient Visits           Today Type 2 diabetes mellitus with  diabetic neuropathy, without long-term current use of insulin  [facility])   [place] [facility] Melvin Pao, NP   3 months ago Encounter for [employer] annual wellness exam   [place] [facility] Melvin Pao, NP   7 months ago Mild episode of recurrent major depressive disorder (HCC)  [place] [facility] Melvin Pao, NP               omeprazole  (PRILOSEC) 20 MG capsule 90 capsule 2    Sig: Take 1 capsule (20 mg total) by mouth daily.     Gastroenterology: Proton Pump Inhibitors Passed - 08/24/2024 12:36 PM      Passed - Valid encounter within last 12 months    Recent Outpatient Visits           Today Type 2 diabetes mellitus with diabetic neuropathy, without long-term current use of insulin  [facility])   [place] [facility] Melvin Pao, NP   3 months ago Encounter for [employer] annual wellness exam   [place] [facility] Melvin Pao, NP   7 months ago Mild episode of recurrent major depressive disorder [facility])   [place] [facility] Melvin Pao, NP              Refused Prescriptions Disp Refills   losartan  (COZAAR ) 100 MG tablet 90 tablet 1    Sig: Take 1 tablet (100 mg total) by mouth daily.     Cardiovascular:  Angiotensin Receptor Blockers Failed - 08/24/2024 12:36 PM      Failed - Cr in normal range and within 180 days    Creatinine  Date Value Ref Range Status  07/15/2014 1.07 0.60 - 1.30 mg/dL Final   Creatinine, Ser  Date Value Ref Range Status  05/18/2024 1.57 (H) 0.76 - 1.27 mg/dL Final         Passed - K in normal range and within 180 days    Potassium  Date Value Ref Range Status  05/18/2024 4.3 3.5 - 5.2 mmol/L Final  07/15/2014 4.4 3.5 - 5.1 mmol/L Final         Passed - Patient is not pregnant      Passed - Last BP in normal range    BP Readings from Last 1 Encounters:  08/24/24 134/72         Passed - Valid encounter within  last 6 months    Recent Outpatient Visits           Today Type 2 diabetes mellitus with diabetic neuropathy, without long-term current use of insulin  (HCC)   [place] [facility] Melvin Pao, NP   3 months ago Encounter for [employer] annual wellness exam   [place] [facility] Melvin Pao, NP   7 months ago Mild episode of recurrent major depressive disorder [facility])   [place] [facility] Melvin Pao, NP               traZODone  (DESYREL ) 50 MG tablet 90 tablet 1     Psychiatry: Antidepressants - Serotonin Modulator Passed - 08/24/2024 12:36 PM      Passed - Completed PHQ-2 or PHQ-9 in the last 360 days      Passed - Valid encounter within last 6 months    Recent Outpatient Visits           Today Type 2 diabetes mellitus with diabetic neuropathy, without long-term current use of insulin  [facility])   [place] [facility] Melvin Pao, NP   3 months ago Encounter for [employer] annual wellness exam   [place] [facility] Melvin Pao, NP   7 months ago Mild episode of recurrent major depressive disorder [facility])   [place] [facility] Melvin Pao, NP  cf

## 2024-08-24 NOTE — Telephone Encounter (Signed)
 Requested Prescriptions  Pending Prescriptions Disp Refills   ARIPiprazole  (ABILIFY ) 2 MG tablet 90 tablet 0    Sig: Take 1 tablet (2 mg total) by mouth daily.     Not Delegated - Psychiatry:  Antipsychotics - Second Generation (Atypical) - aripiprazole  Failed - 08/24/2024 12:33 PM      Failed - This refill cannot be delegated      Failed - Lipid Panel in normal range within the last 12 months    Cholesterol, Total  Date Value Ref Range Status  05/18/2024 210 (H) 100 - 199 mg/dL Final   LDL Chol Calc (NIH)  Date Value Ref Range Status  05/18/2024 138 (H) 0 - 99 mg/dL Final   HDL  Date Value Ref Range Status  05/18/2024 31 (L) >39 mg/dL Final   Triglycerides  Date Value Ref Range Status  05/18/2024 229 (H) 0 - 149 mg/dL Final         Passed - TSH in normal range and within 360 days    TSH  Date Value Ref Range Status  05/18/2024 2.590 0.450 - 4.500 uIU/mL Final         Passed - Completed PHQ-2 or PHQ-9 in the last 360 days      Passed - Last BP in normal range    BP Readings from Last 1 Encounters:  08/24/24 134/72         Passed - Last Heart Rate in normal range    Pulse Readings from Last 1 Encounters:  08/24/24 71         Passed - Valid encounter within last 6 months    Recent Outpatient Visits           Today Type 2 diabetes mellitus with diabetic neuropathy, without long-term current use of insulin  (HCC)   [place] [facility] Melvin Pao, NP   3 months ago Encounter for [employer] annual wellness exam   [place] [facility] Melvin Pao, NP   7 months ago Mild episode of recurrent major depressive disorder [facility])   [place] [facility] Melvin Pao, NP              Passed - CBC within normal limits and completed in the last 12 months    WBC  Date Value Ref Range Status  05/18/2024 7.2 3.4 - 10.8 x10E3/uL Final  09/06/2023 7.8 4.0 - 10.5 K/uL Final   RBC  Date Value Ref Range Status   05/18/2024 5.24 4.14 - 5.80 x10E6/uL Final  09/06/2023 5.05 4.22 - 5.81 MIL/uL Final   Hemoglobin  Date Value Ref Range Status  05/18/2024 13.9 13.0 - 17.7 g/dL Final   Hematocrit  Date Value Ref Range Status  05/18/2024 43.3 37.5 - 51.0 % Final   MCHC  Date Value Ref Range Status  05/18/2024 32.1 31.5 - 35.7 g/dL Final  90/78/7975 66.2 30.0 - 36.0 g/dL Final   [facility]  Date Value Ref Range Status  05/18/2024 26.5 (L) 26.6 - 33.0 pg Final  09/06/2023 26.5 26.0 - 34.0 pg Final   MCV  Date Value Ref Range Status  05/18/2024 83 79 - 97 fL Final  07/01/2014 82 80 - 100 fL Final   No results found for: PLTCOUNTKUC, LABPLAT, POCPLA RDW  Date Value Ref Range Status  05/18/2024 13.4 11.6 - 15.4 % Final  07/01/2014 14.8 (H) 11.5 - 14.5 % Final         Passed - CMP within normal limits and completed  in the last 12 months    Albumin   Date Value Ref Range Status  05/18/2024 4.2 3.9 - 4.9 g/dL Final   Alkaline Phosphatase  Date Value Ref Range Status  05/18/2024 86 44 - 121 IU/L Final   ALT  Date Value Ref Range Status  05/18/2024 15 0 - 44 IU/L Final   AST  Date Value Ref Range Status  05/18/2024 23 0 - 40 IU/L Final   BUN  Date Value Ref Range Status  05/18/2024 15 8 - 27 mg/dL Final  92/68/7984 14 7 - 18 mg/dL Final   Calcium  Date Value Ref Range Status  05/18/2024 8.8 8.6 - 10.2 mg/dL Final   Calcium, Total  Date Value Ref Range Status  07/15/2014 8.3 (L) 8.5 - 10.1 mg/dL Final   CO2  Date Value Ref Range Status  05/18/2024 21 20 - 29 mmol/L Final   Co2  Date Value Ref Range Status  07/15/2014 27 21 - 32 mmol/L Final   Bicarbonate  Date Value Ref Range Status  09/27/2020 26.6 20.0 - 28.0 mmol/L Final   Creatinine  Date Value Ref Range Status  07/15/2014 1.07 0.60 - 1.30 mg/dL Final   Creatinine, Ser  Date Value Ref Range Status  05/18/2024 1.57 (H) 0.76 - 1.27 mg/dL Final   Glucose  Date Value Ref Range Status  05/18/2024 118 (H) 70 -  99 mg/dL Final  92/68/7984 893 (H) 65 - 99 mg/dL Final   Glucose, Bld  Date Value Ref Range Status  09/06/2023 128 (H) 70 - 99 mg/dL Final    Comment:    Glucose reference range applies only to samples taken after fasting for at least 8 hours.   Glucose-Capillary  Date Value Ref Range Status  10/19/2020 102 (H) 70 - 99 mg/dL Final    Comment:    Glucose reference range applies only to samples taken after fasting for at least 8 hours.   Potassium  Date Value Ref Range Status  05/18/2024 4.3 3.5 - 5.2 mmol/L Final  07/15/2014 4.4 3.5 - 5.1 mmol/L Final   Sodium  Date Value Ref Range Status  05/18/2024 139 134 - 144 mmol/L Final  07/15/2014 144 136 - 145 mmol/L Final   Bilirubin Total  Date Value Ref Range Status  05/18/2024 0.3 0.0 - 1.2 mg/dL Final   Protein, ur  Date Value Ref Range Status  10/15/2020 30 (A) NEGATIVE mg/dL Final   Protein,UA  Date Value Ref Range Status  10/21/2022 Negative Negative/Trace Final   Total Protein  Date Value Ref Range Status  05/18/2024 6.6 6.0 - 8.5 g/dL Final   EGFR (African American)  Date Value Ref Range Status  07/15/2014 >60  Final   GFR calc Af Amer  Date Value Ref Range Status  01/26/2021 51 (L) >59 mL/min/1.73 Final    Comment:    **In accordance with recommendations from the NKF-ASN Task force,**   [facility] is in the process of updating its eGFR calculation to the   2021 CKD-EPI creatinine equation that estimates kidney function   without a race variable.    eGFR  Date Value Ref Range Status  05/18/2024 48 (L) >59 mL/min/1.73 Final   EGFR (Non-African Amer.)  Date Value Ref Range Status  07/15/2014 >60  Final    Comment:    eGFR values <29mL/min/1.73 m2 may be an indication of chronic kidney disease (CKD). Calculated eGFR is useful in patients with stable renal function. The eGFR calculation will not be reliable  in acutely ill patients when serum creatinine is changing rapidly. It is not useful in  patients on  dialysis. The eGFR calculation may not be applicable to patients at the low and high extremes of body sizes, pregnant women, and vegetarians.    GFR, Estimated  Date Value Ref Range Status  09/06/2023 53 (L) >60 mL/min Final    Comment:    (NOTE) Calculated using the CKD-EPI Creatinine Equation (2021)           buPROPion  (WELLBUTRIN  XL) 300 MG 24 hr tablet 90 tablet 2    Sig: Take 1 tablet (300 mg total) by mouth daily.     Psychiatry: Antidepressants - bupropion  Failed - 08/24/2024 12:33 PM      Failed - Cr in normal range and within 360 days    Creatinine  Date Value Ref Range Status  07/15/2014 1.07 0.60 - 1.30 mg/dL Final   Creatinine, Ser  Date Value Ref Range Status  05/18/2024 1.57 (H) 0.76 - 1.27 mg/dL Final         Passed - AST in normal range and within 360 days    AST  Date Value Ref Range Status  05/18/2024 23 0 - 40 IU/L Final         Passed - ALT in normal range and within 360 days    ALT  Date Value Ref Range Status  05/18/2024 15 0 - 44 IU/L Final         Passed - Completed PHQ-2 or PHQ-9 in the last 360 days      Passed - Last BP in normal range    BP Readings from Last 1 Encounters:  08/24/24 134/72         Passed - Valid encounter within last 6 months    Recent Outpatient Visits           Today Type 2 diabetes mellitus with diabetic neuropathy, without long-term current use of insulin  (HCC)   [place] [facility] Melvin Pao, NP   3 months ago Encounter for [employer] annual wellness exam   [place] [facility] Melvin Pao, NP   7 months ago Mild episode of recurrent major depressive disorder [facility])   [place] [facility] Melvin Pao, NP               citalopram  (CELEXA ) 40 MG tablet 90 tablet 2    Sig: Take 1 tablet (40 mg total) by mouth daily.     Psychiatry:  Antidepressants - SSRI Passed - 08/24/2024 12:33 PM      Passed - Completed PHQ-2 or PHQ-9 in the last 360  days      Passed - Valid encounter within last 6 months    Recent Outpatient Visits           Today Type 2 diabetes mellitus with diabetic neuropathy, without long-term current use of insulin  [facility])   [place] [facility] Melvin Pao, NP   3 months ago Encounter for [employer] annual wellness exam   [place] [facility] Melvin Pao, NP   7 months ago Mild episode of recurrent major depressive disorder [facility])    [facility] Melvin Pao, NP               clopidogrel  (PLAVIX ) 75 MG tablet 90 tablet 2    Sig: Take 1 tablet (75 mg total) by mouth daily.     Hematology: Antiplatelets - clopidogrel  Failed - 08/24/2024  12:33 PM      Failed - Cr in normal range and within 360 days    Creatinine  Date Value Ref Range Status  07/15/2014 1.07 0.60 - 1.30 mg/dL Final   Creatinine, Ser  Date Value Ref Range Status  05/18/2024 1.57 (H) 0.76 - 1.27 mg/dL Final         Passed - HCT in normal range and within 180 days    Hematocrit  Date Value Ref Range Status  05/18/2024 43.3 37.5 - 51.0 % Final         Passed - HGB in normal range and within 180 days    Hemoglobin  Date Value Ref Range Status  05/18/2024 13.9 13.0 - 17.7 g/dL Final         Passed - PLT in normal range and within 180 days    Platelets  Date Value Ref Range Status  05/18/2024 157 150 - 450 x10E3/uL Final         Passed - Valid encounter within last 6 months    Recent Outpatient Visits           Today Type 2 diabetes mellitus with diabetic neuropathy, without long-term current use of insulin  (HCC)   [place] [facility] Melvin Pa[facility], NP   3 months ago Encounter for [employer] annual wellness exam   [place] [facility] Melvin Pao, NP   7 months ago Mild episode of recurrent major depressive disorder [facility])   [place] [facility] Melvin Pao, NP               furosemide   (LASIX ) 20 MG tablet 90 tablet 2    Sig: Take 1 tablet daily     Cardiovascular:  Diuretics - Loop Failed - 08/24/2024 12:33 PM      Failed - Cr in normal range and within 180 days    Creatinine  Date Value Ref Range Status  07/15/2014 1.07 0.60 - 1.30 mg/dL Final   Creatinine, Ser  Date Value Ref Range Status  05/18/2024 1.57 (H) 0.76 - 1.27 mg/dL Final         Failed - Mg Level in normal range and within 180 days    Magnesium  Date Value Ref Range Status  10/19/2020 2.2 1.7 - 2.4 mg/dL Final    Comment:    Performed at [facility], [address] [place], [place] [address]         Passed - K in normal range and within 180 days    Potassium  Date Value Ref Range Status  05/18/2024 4.3 3.5 - 5.2 mmol/L Final  07/15/2014 4.4 3.5 - 5.1 mmol/L Final         Passed - Ca in normal range and within 180 days    Calcium  Date Value Ref Range Status  05/18/2024 8.8 8.6 - 10.2 mg/dL Final   Calcium, Total  Date Value Ref Range Status  07/15/2014 8.3 (L) 8.5 - 10.1 mg/dL Final         Passed - Na in normal range and within 180 days    Sodium  Date Value Ref Range Status  05/18/2024 139 134 - 144 mmol/L Final  07/15/2014 144 136 - 145 mmol/L Final         Passed - Cl in normal range and within 180 days    Chloride  Date Value Ref Range Status  05/18/2024 104 96 - 106 mmol/L Final  07/15/2014 111 (H) 98 -  107 mmol/L Final         Passed - Last BP in normal range    BP Readings from Last 1 Encounters:  08/24/24 134/72         Passed - Valid encounter within last 6 months    Recent Outpatient Visits           Today Type 2 diabetes mellitus with diabetic neuropathy, without long-term current use of insulin  [facility])   [place] [facility] Melvin Pao, NP   3 months ago Encounter for [employer] annual wellness exam   [place] [facility] Melvin Pao, NP   7 months ago Mild episode of recurrent major depressive disorder  [facility])   [place] [facility] Melvin Pao, NP               gabapentin  (NEURONTIN ) 300 MG capsule 180 capsule 1    Sig: Take 2 capsules (600 mg total) by mouth at bedtime as needed.     Neurology: Anticonvulsants - gabapentin  Failed - 08/24/2024 12:33 PM      Failed - Cr in normal range and within 360 days    Creatinine  Date Value Ref Range Status  07/15/2014 1.07 0.60 - 1.30 mg/dL Final   Creatinine, Ser  Date Value Ref Range Status  05/18/2024 1.57 (H) 0.76 - 1.27 mg/dL Final         Passed - Completed PHQ-2 or PHQ-9 in the last 360 days      Passed - Valid encounter within last 12 months    Recent Outpatient Visits           Today Type 2 diabetes mellitus with diabetic neuropathy, without long-term current use of insulin  [facility])   [place] [facility] Melvin Pao, NP   3 months ago Encounter for [employer] annual wellness exam   [place] [facility] Melvin Pao, NP   7 months ago Mild episode of recurrent major depressive disorder [facility])   [place] [facility] Melvin Pao, NP               metoprolol  succinate (TOPROL -XL) 50 MG 24 hr tablet 90 tablet 2    Sig: Take 1 tablet (50 mg total) by mouth daily.     Cardiovascular:  Beta Blockers Passed - 08/24/2024 12:33 PM      Passed - Last BP in normal range    BP Readings from Last 1 Encounters:  08/24/24 134/72         Passed - Last Heart Rate in normal range    Pulse Readings from Last 1 Encounters:  08/24/24 71         Passed - Valid encounter within last 6 months    Recent Outpatient Visits           Today Type 2 diabetes mellitus with diabetic neuropathy, without long-term current use of insulin  (HCC)   [place] [facility] Melvin Pao, NP   3 months ago Encounter for [employer] annual wellness exam   [place] [facility] Melvin Pao, NP   7 months ago Mild episode of recurrent  major depressive disorder [facility])   [place] [facility] Melvin Pao, NP               omeprazole  (PRILOSEC) 20 MG capsule 90 capsule 2    Sig: Take 1 capsule (20 mg total) by mouth daily.     Gastroenterology: Proton Pump Inhibitors  Passed - 08/24/2024 12:33 PM      Passed - Valid encounter within last 12 months    Recent Outpatient Visits           Today Type 2 diabetes mellitus with diabetic neuropathy, without long-term current use of insulin  [facility])   [place] [facility] Melvin Pa[facility], NP   3 months ago Encounter for [employer] annual wellness exam   [place] [facility] Melvin Pao, NP   7 months ago Mild episode of recurrent major depressive disorder [facility])   [place] [facility] Melvin Pao, NP              Refused Prescriptions Disp Refills   losartan  (COZAAR ) 100 MG tablet 90 tablet 1    Sig: Take 1 tablet (100 mg total) by mouth daily.     Cardiovascular:  Angiotensin Receptor Blockers Failed - 08/24/2024 12:33 PM      Failed - Cr in normal range and within 180 days    Creatinine  Date Value Ref Range Status  07/15/2014 1.07 0.60 - 1.30 mg/dL Final   Creatinine, Ser  Date Value Ref Range Status  05/18/2024 1.57 (H) 0.76 - 1.27 mg/dL Final         Passed - K in normal range and within 180 days    Potassium  Date Value Ref Range Status  05/18/2024 4.3 3.5 - 5.2 mmol/L Final  07/15/2014 4.4 3.5 - 5.1 mmol/L Final         Passed - Patient is not pregnant      Passed - Last BP in normal range    BP Readings from Last 1 Encounters:  08/24/24 134/72         Passed - Valid encounter within last 6 months    Recent Outpatient Visits           Today Type 2 diabetes mellitus with diabetic neuropathy, without long-term current use of insulin  (HCC)   [place] [facility] Melvin Pao, NP   3 months ago Encounter for [employer] annual wellness exam   [facility] Melvin Pao, NP   7 months ago Mild episode of recurrent major depressive disorder [facility])   [place] [facility] Melvin Pao, NP               traZODone  (DESYREL ) 50 MG tablet 90 tablet 1     Psychiatry: Antidepressants - Serotonin Modulator Passed - 08/24/2024 12:33 PM      Passed - Completed PHQ-2 or PHQ-9 in the last 360 days      Passed - Valid encounter within last 6 months    Recent Outpatient Visits           Today Type 2 diabetes mellitus with diabetic neuropathy, without long-term current use of insulin  [facility])   [place] [facility] Melvin Pao, NP   3 months ago Encounter for [employer] annual wellness exam   [place] [facility] Melvin Pao, NP   7 months ago Mild episode of recurrent major depressive disorder [facility])   [place] [facility] Pao, NP

## 2024-08-24 NOTE — Assessment & Plan Note (Signed)
 Chronic. Well controlled.  Continue with Celexa  and Wellbutrin  and Abilify .  Side effects and benefits of medication discussed.  Can wean down on Celexa  if symptoms improve.  Follow up in 6 months.  Call sooner if concerns arise.

## 2024-08-24 NOTE — Progress Notes (Signed)
 BP 134/72   Pulse 71   Temp 98 F (36.7 C) (Oral)   Ht 5' 10.5 (1.791 m)   Wt 249 lb (112.9 kg)   SpO2 95%   BMI 35.22 kg/m    Subjective:    Patient ID: Steven Vang, male    DOB: 1956/12/05, 68 y.o.   MRN: [id number]  HPI: Steven Vang is a 68 y.o. male  Chief Complaint  Patient presents with   Depression   Diabetes    No recent eye exam per patient   Hyperlipidemia   Hypertension   Insect Bite    Patient states he has multiple small insect bites all over. States he has had them for about 2 to 3 weeks now and they do itch sometimes.     HYPERTENSION without Chronic Kidney Disease Hypertension status: controlled Satisfied with current treatment? no Duration of hypertension: years BP monitoring frequency:  weekly BP range: 130/70 BP medication side effects:  no Medication compliance: excellent compliance Previous BP meds:Lasix , Metoprolol , losartan  Aspirin : no Recurrent headaches: no Visual changes: no Palpitations: no Dyspnea: no Chest pain: no Lower extremity edema: no Dizzy/lightheaded: no   DEPRESSION Patient states he's been okay. He like his current medication regimen.  Mood status: better Satisfied with current treatment?: no Symptom severity: mild  Duration of current treatment : years Side effects: no Medication compliance: good compliance Psychotherapy/counseling: no  Previous psychiatric medications: celexa , wellbutrin  Depressed mood: yes Anxious mood: yes Anhedonia: no Significant weight loss or gain: yes Insomnia: no  Fatigue: yes Feelings of worthlessness or guilt: no Impaired concentration/indecisiveness: no Suicidal ideations: no Hopelessness: no Crying spells: no    08/24/2024    8:45 AM 05/18/2024   10:40 AM 01/23/2024    8:19 AM 10/23/2023   10:09 AM 09/15/2023    3:03 PM  Depression screen PHQ 2/9  Decreased Interest 1 1 1 1 1   Down, Depressed, Hopeless 0 0  0 1  PHQ - 2 Score 1 1 1 1 2   Altered sleeping 1 0 0 0 0   Tired, decreased energy 1 0 1 1 1   Change in appetite 2 0 2 1 1   Feeling bad or failure about yourself  0 0 0 0 0  Trouble concentrating 1 0 0 1 1  Moving slowly or fidgety/restless 0 0 0 0 1  Suicidal thoughts 0 0 0 0 0  PHQ-9 Score 6 1 4 4 6   Difficult doing work/chores Somewhat difficult Not difficult at all   Not difficult at all   DIABETES Ozempic  2mg .  He started taking it at night and has less side effects from the medication.   Hypoglycemic episodes:no Polydipsia/polyuria: no Visual disturbance: no Chest pain: no Paresthesias: no Glucose Monitoring: yes  Accucheck frequency: sometimes  Fasting glucose: 162 this morning  Post prandial:  Evening:  Before meals: Taking Insulin ?: no  Long acting insulin :  Short acting insulin : Blood Pressure Monitoring: not checking Retinal Examination:  not up to date Foot Exam: Up to Date Diabetic Education: Not Completed Pneumovax: Up to Date Influenza: Up to Date Aspirin : no   Relevant past medical, surgical, family and social history reviewed and updated as indicated. Interim medical history since our last visit reviewed. Allergies and medications reviewed and updated.  Review of Systems  Eyes:  Negative for visual disturbance.  Respiratory: Negative.  Negative for chest tightness and shortness of breath.   Cardiovascular: Negative.  Negative for chest pain, palpitations and leg swelling.  Gastrointestinal:  Negative.   Endocrine: Negative for polydipsia and polyuria.  Musculoskeletal:        Left foot pain  Neurological: Negative.  Negative for dizziness, light-headedness, numbness and headaches.  Psychiatric/Behavioral:  Positive for dysphoric mood. Negative for agitation, behavioral problems, confusion, decreased concentration, hallucinations, self-injury, sleep disturbance and suicidal ideas. The patient is not nervous/anxious and is not hyperactive.     Per HPI unless specifically indicated above     Objective:     BP 134/72   Pulse 71   Temp 98 F (36.7 C) (Oral)   Ht 5' 10.5 (1.791 m)   Wt 249 lb (112.9 kg)   SpO2 95%   BMI 35.22 kg/m   Wt Readings from Last 3 Encounters:  08/24/24 249 lb (112.9 kg)  05/18/24 247 lb (112 kg)  01/23/24 247 lb 12.8 oz (112.4 kg)    Physical Exam Vitals and nursing note reviewed.  Constitutional:      General: He is not in acute distress.    Appearance: Normal appearance. He is obese. He is not ill-appearing, toxic-appearing or diaphoretic.  HENT:     Head: Normocephalic and atraumatic.     Right Ear: External ear normal.     Left Ear: External ear normal.     Nose: Nose normal. No congestion or rhinorrhea.     Mouth/Throat:     Mouth: Mucous membranes are moist.     Pharynx: Oropharynx is clear.  Eyes:     General: No scleral icterus.       Right eye: No discharge.        Left eye: No discharge.     Extraocular Movements: Extraocular movements intact.     Conjunctiva/sclera: Conjunctivae normal.     Pupils: Pupils are equal, round, and reactive to light.  Cardiovascular:     Rate and Rhythm: Normal rate and regular rhythm.     Heart sounds: No murmur heard. Pulmonary:     Effort: Pulmonary effort is normal. No respiratory distress.     Breath sounds: Normal breath sounds. No wheezing, rhonchi or rales.     Comments: Speaking in full sentences Abdominal:     General: Abdomen is flat. Bowel sounds are normal.  Musculoskeletal:        General: Normal range of motion.     Cervical back: Normal range of motion and neck supple.     Left foot: Tenderness present.     Comments: Heal and arch pain with palpation  Skin:    General: Skin is warm and dry.     Capillary Refill: Capillary refill takes less than 2 seconds.     Coloration: Skin is not jaundiced or pale.     Findings: No bruising, erythema, lesion or rash.  Neurological:     General: No focal deficit present.     Mental Status: He is alert and oriented to person, place, and time. Mental  status is at baseline.  Psychiatric:        Mood and Affect: Mood normal.        Behavior: Behavior normal.        Thought Content: Thought content normal.        Judgment: Judgment normal.     Results for orders placed or performed in visit on 05/18/24  Hemoglobin A1c   Collection Time: 05/18/24 11:07 AM  Result Value Ref Range   Hgb A1c MFr Bld 5.8 (H) 4.8 - 5.6 %   Est. average glucose Bld gHb Est-mCnc  120 mg/dL  TSH   Collection Time: 05/18/24 11:07 AM  Result Value Ref Range   TSH 2.590 0.450 - 4.500 uIU/mL  PSA   Collection Time: 05/18/24 11:07 AM  Result Value Ref Range   Prostate Specific Ag, Serum 2.5 0.0 - 4.0 ng/mL  Lipid panel   Collection Time: 05/18/24 11:07 AM  Result Value Ref Range   Cholesterol, Total 210 (H) 100 - 199 mg/dL   Triglycerides 770 (H) 0 - 149 mg/dL   HDL 31 (L) >60 mg/dL   VLDL Cholesterol Cal 41 (H) 5 - 40 mg/dL   LDL Chol Calc (NIH) 861 (H) 0 - 99 mg/dL   Chol/HDL Ratio 6.8 (H) 0.0 - 5.0 ratio  CBC with Differential/Platelet   Collection Time: 05/18/24 11:07 AM  Result Value Ref Range   WBC 7.2 3.4 - 10.8 x10E3/uL   RBC 5.24 4.14 - 5.80 x10E6/uL   Hemoglobin 13.9 13.0 - 17.7 g/dL   Hematocrit 56.6 62.4 - 51.0 %   MCV 83 79 - 97 fL   MCH 26.5 (L) 26.6 - 33.0 pg   MCHC 32.1 31.5 - 35.7 g/dL   RDW 86.5 88.3 - 84.5 %   Platelets 157 150 - 450 x10E3/uL   Neutrophils 56 Not Estab. %   Lymphs 27 Not Estab. %   Monocytes 8 Not Estab. %   Eos 8 Not Estab. %   Basos 1 Not Estab. %   Neutrophils Absolute 4.0 1.4 - 7.0 x10E3/uL   Lymphocytes Absolute 2.0 0.7 - 3.1 x10E3/uL   Monocytes Absolute 0.6 0.1 - 0.9 x10E3/uL   EOS (ABSOLUTE) 0.6 (H) 0.0 - 0.4 x10E3/uL   Basophils Absolute 0.1 0.0 - 0.2 x10E3/uL   Immature Granulocytes 0 Not Estab. %   Immature Grans (Abs) 0.0 0.0 - 0.1 x10E3/uL  Comprehensive metabolic panel with GFR   Collection Time: 05/18/24 11:07 AM  Result Value Ref Range   Glucose 118 (H) 70 - 99 mg/dL   BUN 15 8 - 27  mg/dL   Creatinine, Ser 8.42 (H) 0.76 - 1.27 mg/dL   eGFR 48 (L) >40 fO/fpw/8.26   BUN/Creatinine Ratio 10 10 - 24   Sodium 139 134 - 144 mmol/L   Potassium 4.3 3.5 - 5.2 mmol/L   Chloride 104 96 - 106 mmol/L   CO2 21 20 - 29 mmol/L   Calcium 8.8 8.6 - 10.2 mg/dL   Total Protein 6.6 6.0 - 8.5 g/dL   Albumin  4.2 3.9 - 4.9 g/dL   Globulin, Total 2.4 1.5 - 4.5 g/dL   Bilirubin Total 0.3 0.0 - 1.2 mg/dL   Alkaline Phosphatase 86 44 - 121 IU/L   AST 23 0 - 40 IU/L   ALT 15 0 - 44 IU/L      Assessment & Plan:   Problem List Items Addressed This Visit       Cardiovascular and Mediastinum   Aortic atherosclerosis (HCC)   Chronic.  Controlled.  Continue with current medication regimen.  Continue with Plavix .  On Pravastatin  weekly- doesn't tolerate more than weekly.  Labs ordered today.  Return to clinic in 6 months for reevaluation.  Call sooner if concerns arise.       Relevant Medications   losartan  (COZAAR ) 50 MG tablet   CHF (congestive heart failure) (HCC)   Followed by Cardiology.  Most recent note reviewed.  Continue to follow up with specialist.      Relevant Medications   losartan  (COZAAR ) 50 MG tablet  Endocrine   Type 2 diabetes mellitus with diabetic neuropathy, unspecified (HCC) - Primary   Chronic.  Controlled at 5.8%. Doing well with Ozempic  2mg .  Weight is the same.  Side effects have improved since giving it at bedtime. Cost is no longer an issue. Continue with other medications.  Labs ordered today.  Return to clinic in 3 months for reevaluation.  Call sooner if concerns arise.       Relevant Medications   losartan  (COZAAR ) 50 MG tablet   Other Relevant Orders   Comprehensive metabolic panel with GFR   Hemoglobin A1c     Genitourinary   CKD (chronic kidney disease), stage III (HCC)   Chronic.  Controlled.  Continue with current medication regimen.  Labs ordered today.  Return to clinic in 6 months for reevaluation.  Call sooner if concerns arise.          Other   Mild episode of recurrent major depressive disorder (HCC)   Chronic. Well controlled.  Continue with Celexa  and Wellbutrin  and Abilify .  Side effects and benefits of medication discussed.  Can wean down on Celexa  if symptoms improve.  Follow up in 6 months.  Call sooner if concerns arise.       Morbid obesity (HCC)   Recommended eating smaller high protein, low fat meals more frequently and exercising 30 mins a day 5 times a week with a goal of 10-15lb weight loss in the next 3 months. Continue with Ozempic .       Mixed hyperlipidemia   Chronic.  Controlled.  Continue with current medication regimen of Pravastatin .  Labs ordered today.  Return to clinic in 6 months for reevaluation.  Call sooner if concerns arise.        Relevant Medications   losartan  (COZAAR ) 50 MG tablet   Other Relevant Orders   Lipid panel   Other Visit Diagnoses       Need for influenza vaccination       Relevant Orders   Flu vaccine HIGH DOSE PF(Fluzone Trivalent) (Completed)          Follow up plan: Return in about 6 months (around 02/21/2025) for HTN, HLD, DM2 FU.

## 2024-08-24 NOTE — Assessment & Plan Note (Signed)
 Chronic.  Controlled at 5.8%. Doing well with Ozempic  2mg .  Weight is the same.  Side effects have improved since giving it at bedtime. Cost is no longer an issue. Continue with other medications.  Labs ordered today.  Return to clinic in 3 months for reevaluation.  Call sooner if concerns arise.

## 2024-08-24 NOTE — Assessment & Plan Note (Signed)
 Chronic.  Controlled.  Continue with current medication regimen.  Labs ordered today.  Return to clinic in 6 months for reevaluation.  Call sooner if concerns arise.  ? ?

## 2024-08-25 ENCOUNTER — Other Ambulatory Visit: Payer: Self-pay

## 2024-08-25 ENCOUNTER — Ambulatory Visit: Payer: Self-pay | Admitting: Nurse Practitioner

## 2024-08-25 LAB — LIPID PANEL
Chol/HDL Ratio: 6.5 ratio — ABNORMAL HIGH (ref 0.0–5.0)
Cholesterol, Total: 208 mg/dL — ABNORMAL HIGH (ref 100–199)
HDL: 32 mg/dL — ABNORMAL LOW (ref >39)
LDL Chol Calc (NIH): 137 mg/dL — ABNORMAL HIGH (ref 0–99)
Triglycerides: 215 mg/dL — ABNORMAL HIGH (ref 0–149)
VLDL Cholesterol Cal: 39 mg/dL (ref 5–40)

## 2024-08-25 LAB — COMPREHENSIVE METABOLIC PANEL WITH GFR
ALT: 16 IU/L (ref 0–44)
AST: 20 IU/L (ref 0–40)
Albumin: 4.2 g/dL (ref 3.9–4.9)
Alkaline Phosphatase: 75 IU/L (ref 44–121)
BUN/Creatinine Ratio: 10 (ref 10–24)
BUN: 14 mg/dL (ref 8–27)
Bilirubin Total: 0.2 mg/dL (ref 0.0–1.2)
CO2: 23 mmol/L (ref 20–29)
Calcium: 9.1 mg/dL (ref 8.6–10.2)
Chloride: 101 mmol/L (ref 96–106)
Creatinine, Ser: 1.4 mg/dL — ABNORMAL HIGH (ref 0.76–1.27)
Globulin, Total: 2.2 g/dL (ref 1.5–4.5)
Glucose: 90 mg/dL (ref 70–99)
Potassium: 4.8 mmol/L (ref 3.5–5.2)
Sodium: 138 mmol/L (ref 134–144)
Total Protein: 6.4 g/dL (ref 6.0–8.5)
eGFR: 55 mL/min/1.73 — ABNORMAL LOW (ref >59)

## 2024-08-25 LAB — HEMOGLOBIN A1C
Est. average glucose Bld gHb Est-mCnc: 126 mg/dL
Hgb A1c MFr Bld: 6 % — ABNORMAL HIGH (ref 4.8–5.6)

## 2024-08-25 MED ORDER — LOSARTAN POTASSIUM 50 MG PO TABS: 50.0000 mg | ORAL_TABLET | Freq: Every day | ORAL | 1 refills | Status: AC

## 2024-08-25 NOTE — Telephone Encounter (Signed)
 Refused Lasix  20 mg because this is a duplicate request.

## 2024-09-08 ENCOUNTER — Other Ambulatory Visit: Payer: Self-pay | Admitting: Nurse Practitioner

## 2024-09-08 DIAGNOSIS — F330 Major depressive disorder, recurrent, mild: Secondary | ICD-10-CM

## 2024-09-09 NOTE — Telephone Encounter (Signed)
 Requested Prescriptions  Refused Prescriptions Disp Refills   traZODone  (DESYREL ) 50 MG tablet [Pharmacy Med Name: TRAZODONE  50MG  TABLETS] 90 tablet 1    Sig: TAKE 1 TABLET BY MOUTH DAILY AT BEDTIME AS NEEDED     Psychiatry: Antidepressants - Serotonin Modulator Passed - 09/09/2024  5:12 PM      Passed - Completed PHQ-2 or PHQ-9 in the last 360 days      Passed - Valid encounter within last 6 months    Recent Outpatient Visits           2 weeks ago Type 2 diabetes mellitus with diabetic neuropathy, without long-term current use of insulin  [facility])   [place] [facility] Melvin Pao, NP   3 months ago Encounter for [employer] annual wellness exam   [place] [facility] Melvin Pao, NP   7 months ago Mild episode of recurrent major depressive disorder   [place] [facility] Melvin Pao, NP

## 2024-09-21 NOTE — Progress Notes (Signed)
 Steven Vang                                          MRN: [id number]   09/21/2024   The [facility] Quality Team Specialist reviewed this patient medical record for the purposes of chart review for care gap closure. The following were reviewed: chart review for care gap closure-kidney health evaluation for diabetes:eGFR  and uACR.    [facility] Quality Team

## 2024-09-30 NOTE — Progress Notes (Signed)
 Steven Vang                                          MRN: [id number]   09/30/2024   The [facility] Quality Team Specialist reviewed this patient medical record for the purposes of chart review for care gap closure. The following were reviewed: chart review for care gap closure-diabetic eye exam.    [facility] Quality Team

## 2024-10-18 DIAGNOSIS — E785 Hyperlipidemia, unspecified: Secondary | ICD-10-CM | POA: Diagnosis not present

## 2024-10-18 DIAGNOSIS — R7989 Other specified abnormal findings of blood chemistry: Secondary | ICD-10-CM | POA: Diagnosis not present

## 2024-10-18 DIAGNOSIS — Z9049 Acquired absence of other specified parts of digestive tract: Secondary | ICD-10-CM | POA: Diagnosis not present

## 2024-10-18 DIAGNOSIS — Z7984 Long term (current) use of oral hypoglycemic drugs: Secondary | ICD-10-CM | POA: Diagnosis not present

## 2024-10-18 DIAGNOSIS — S4992XA Unspecified injury of left shoulder and upper arm, initial encounter: Secondary | ICD-10-CM | POA: Diagnosis not present

## 2024-10-18 DIAGNOSIS — M542 Cervicalgia: Secondary | ICD-10-CM | POA: Diagnosis not present

## 2024-10-18 DIAGNOSIS — M19012 Primary osteoarthritis, left shoulder: Secondary | ICD-10-CM | POA: Diagnosis not present

## 2024-10-18 DIAGNOSIS — Z87891 Personal history of nicotine dependence: Secondary | ICD-10-CM | POA: Diagnosis not present

## 2024-10-18 DIAGNOSIS — Z79899 Other long term (current) drug therapy: Secondary | ICD-10-CM | POA: Diagnosis not present

## 2024-10-18 DIAGNOSIS — M25522 Pain in left elbow: Secondary | ICD-10-CM | POA: Diagnosis not present

## 2024-10-18 DIAGNOSIS — S0990XA Unspecified injury of head, initial encounter: Secondary | ICD-10-CM | POA: Diagnosis not present

## 2024-10-18 DIAGNOSIS — M25512 Pain in left shoulder: Secondary | ICD-10-CM | POA: Diagnosis not present

## 2024-10-18 DIAGNOSIS — M79622 Pain in left upper arm: Secondary | ICD-10-CM | POA: Diagnosis not present

## 2024-10-18 DIAGNOSIS — I110 Hypertensive heart disease with heart failure: Secondary | ICD-10-CM | POA: Diagnosis not present

## 2024-10-18 DIAGNOSIS — E119 Type 2 diabetes mellitus without complications: Secondary | ICD-10-CM | POA: Diagnosis not present

## 2024-10-18 DIAGNOSIS — I509 Heart failure, unspecified: Secondary | ICD-10-CM | POA: Diagnosis not present

## 2024-10-18 DIAGNOSIS — Z043 Encounter for examination and observation following other accident: Secondary | ICD-10-CM | POA: Diagnosis not present

## 2024-10-18 DIAGNOSIS — Z7982 Long term (current) use of aspirin: Secondary | ICD-10-CM | POA: Diagnosis not present

## 2024-10-18 DIAGNOSIS — W109XXA Fall (on) (from) unspecified stairs and steps, initial encounter: Secondary | ICD-10-CM | POA: Diagnosis not present

## 2024-10-18 DIAGNOSIS — Z7902 Long term (current) use of antithrombotics/antiplatelets: Secondary | ICD-10-CM | POA: Diagnosis not present

## 2024-10-18 DIAGNOSIS — I2510 Atherosclerotic heart disease of native coronary artery without angina pectoris: Secondary | ICD-10-CM | POA: Diagnosis not present

## 2024-12-04 ENCOUNTER — Other Ambulatory Visit: Payer: Self-pay | Admitting: Nurse Practitioner

## 2024-12-20 DIAGNOSIS — F330 Major depressive disorder, recurrent, mild: Secondary | ICD-10-CM

## 2024-12-20 NOTE — Telephone Encounter (Signed)
 Copied from CRM #[id number]. Topic: Clinical - Medication Refill >> Dec 20, 2024  9:47 AM Rea C wrote: Medication: traZODone  (DESYREL ) 50 MG tablet  Has the patient contacted their pharmacy? No (Agent: If no, request that the patient contact the pharmacy for the refill. If patient does not wish to contact the pharmacy document the reason why and proceed with request.) (Agent: If yes, when and what did the pharmacy advise?)  This is the patient's preferred pharmacy:  [id number] [place], [place] - [address] AT [facility] [address] [place] [address] Phone: [phone] Fax: [phone]  Is this the correct pharmacy for this prescription? Yes If no, delete pharmacy and type the correct one.   Has the prescription been filled recently? Yes  Is the patient out of the medication? Yes  Has the patient been seen for an appointment in the last year OR does the patient have an upcoming appointment? Yes  Can we respond through MyChart? Yes  Agent: Please be advised that Rx refills may take up to 3 business days. We ask that you follow-up with your pharmacy.

## 2024-12-21 MED ORDER — TRAZODONE HCL 50 MG PO TABS: 50.0000 mg | ORAL_TABLET | Freq: Every day | ORAL | 1 refills | Status: AC

## 2025-01-08 ENCOUNTER — Other Ambulatory Visit: Payer: Self-pay | Admitting: Nurse Practitioner

## 2025-01-10 NOTE — Telephone Encounter (Signed)
 Requested medication (s) are due for refill today - no  Requested medication (s) are on the active medication list -yes  Future visit scheduled -yes  Last refill: 08/24/24 #90 1RF  Notes to clinic: non delegated Rx  Requested Prescriptions  Pending Prescriptions Disp Refills   ARIPiprazole  (ABILIFY ) 2 MG tablet [Pharmacy Med Name: ARIPIPRAZOLE  2MG  TABLETS] 90 tablet 1    Sig: TAKE 1 TABLET(2 MG) BY MOUTH DAILY     Not Delegated - Psychiatry:  Antipsychotics - Second Generation (Atypical) - aripiprazole  Failed - 01/10/2025 11:50 AM      Failed - This refill cannot be delegated      Failed - Lipid Panel in normal range within the last 12 months    Cholesterol, Total  Date Value Ref Range Status  08/24/2024 208 (H) 100 - 199 mg/dL Final   LDL Chol Calc (NIH)  Date Value Ref Range Status  08/24/2024 137 (H) 0 - 99 mg/dL Final   HDL  Date Value Ref Range Status  08/24/2024 32 (L) >39 mg/dL Final   Triglycerides  Date Value Ref Range Status  08/24/2024 215 (H) 0 - 149 mg/dL Final         Passed - TSH in normal range and within 360 days    TSH  Date Value Ref Range Status  05/18/2024 2.590 0.450 - 4.500 uIU/mL Final         Passed - Completed PHQ-2 or PHQ-9 in the last 360 days      Passed - Last BP in normal range    BP Readings from Last 1 Encounters:  08/24/24 134/72         Passed - Last Heart Rate in normal range    Pulse Readings from Last 1 Encounters:  08/24/24 71         Passed - Valid encounter within last 6 months    Recent Outpatient Visits           4 months ago Type 2 diabetes mellitus with diabetic neuropathy, without long-term current use of insulin  (HCC)   [place] [facility], Darice, NP   7 months ago Encounter for [employer] annual wellness exam   [place] [facility] Melvin Darice, NP   11 months ago Mild episode of recurrent major depressive disorder   [place] [facility]  Melvin Darice, NP              Passed - CBC within normal limits and completed in the last 12 months    WBC  Date Value Ref Range Status  05/18/2024 7.2 3.4 - 10.8 x10E3/uL Final  09/06/2023 7.8 4.0 - 10.5 K/uL Final   RBC  Date Value Ref Range Status  05/18/2024 5.24 4.14 - 5.80 x10E6/uL Final  09/06/2023 5.05 4.22 - 5.81 MIL/uL Final   Hemoglobin  Date Value Ref Range Status  05/18/2024 13.9 13.0 - 17.7 g/dL Final   Hematocrit  Date Value Ref Range Status  05/18/2024 43.3 37.5 - 51.0 % Final   MCHC  Date Value Ref Range Status  05/18/2024 32.1 31.5 - 35.7 g/dL Final  90/78/7975 66.2 30.0 - 36.0 g/dL Final   [facility]  Date Value Ref Range Status  05/18/2024 26.5 (L) 26.6 - 33.0 pg Final  09/06/2023 26.5 26.0 - 34.0 pg Final   MCV  Date Value Ref Range Status  05/18/2024 83 79 - 97 fL Final  07/01/2014 82 80 - 100 fL Final   No results  found for: PLTCOUNTKUC, LABPLAT, POCPLA RDW  Date Value Ref Range Status  05/18/2024 13.4 11.6 - 15.4 % Final  07/01/2014 14.8 (H) 11.5 - 14.5 % Final         Passed - CMP within normal limits and completed in the last 12 months    Albumin   Date Value Ref Range Status  08/24/2024 4.2 3.9 - 4.9 g/dL Final   Alkaline Phosphatase  Date Value Ref Range Status  08/24/2024 75 44 - 121 IU/L Final    Comment:    **Effective August 30, 2024 Alkaline Phosphatase**   reference interval will be changing to:              Age                Male          Male           0 -  5 days         47 - 127       47 - 127           6 - 10 days         29 - 242       29 - 242          11 - 20 days        109 - 357      109 - 357          21 - 30 days         94 - 494       94 - 494           1 -  2 months      149 - 539      149 - 539           3 -  6 months      131 - 452      131 - 452           7 - 11 months      117 - 401      117 - 401   12 months -  6 years       158 - 369      158 - 369           7 - 12 years       150 - 409       150 - 409               13 years       156 - 435       78 - 227               14 years       114 - 375       64 - 161               15 years        88 - 279       56 - 134               16 years        74 - 207       51 - 121               17 years        63 - 161       54 -  113          18 - 20 years        51 - 125       42 - 106          21 - 50 years        47 - 123       41 - 116          51 - 80 years        49 - 135       51 - 125              >80 years        48 - 129       48 - 129    ALT  Date Value Ref Range Status  08/24/2024 16 0 - 44 IU/L Final   AST  Date Value Ref Range Status  08/24/2024 20 0 - 40 IU/L Final   BUN  Date Value Ref Range Status  08/24/2024 14 8 - 27 mg/dL Final  92/68/7984 14 7 - 18 mg/dL Final   Calcium  Date Value Ref Range Status  08/24/2024 9.1 8.6 - 10.2 mg/dL Final   Calcium, Total  Date Value Ref Range Status  07/15/2014 8.3 (L) 8.5 - 10.1 mg/dL Final   CO2  Date Value Ref Range Status  08/24/2024 23 20 - 29 mmol/L Final   Co2  Date Value Ref Range Status  07/15/2014 27 21 - 32 mmol/L Final   Bicarbonate  Date Value Ref Range Status  09/27/2020 26.6 20.0 - 28.0 mmol/L Final   Creatinine  Date Value Ref Range Status  07/15/2014 1.07 0.60 - 1.30 mg/dL Final   Creatinine, Ser  Date Value Ref Range Status  08/24/2024 1.40 (H) 0.76 - 1.27 mg/dL Final   Glucose  Date Value Ref Range Status  08/24/2024 90 70 - 99 mg/dL Final  92/68/7984 893 (H) 65 - 99 mg/dL Final   Glucose, Bld  Date Value Ref Range Status  09/06/2023 128 (H) 70 - 99 mg/dL Final    Comment:    Glucose reference range applies only to samples taken after fasting for at least 8 hours.   Glucose-Capillary  Date Value Ref Range Status  10/19/2020 102 (H) 70 - 99 mg/dL Final    Comment:    Glucose reference range applies only to samples taken after fasting for at least 8 hours.   Potassium  Date Value Ref Range Status  08/24/2024 4.8 3.5 - 5.2  mmol/L Final  07/15/2014 4.4 3.5 - 5.1 mmol/L Final   Sodium  Date Value Ref Range Status  08/24/2024 138 134 - 144 mmol/L Final  07/15/2014 144 136 - 145 mmol/L Final   Bilirubin Total  Date Value Ref Range Status  08/24/2024 0.2 0.0 - 1.2 mg/dL Final   Protein, ur  Date Value Ref Range Status  10/15/2020 30 (A) NEGATIVE mg/dL Final   Protein,UA  Date Value Ref Range Status  10/21/2022 Negative Negative/Trace Final   Total Protein  Date Value Ref Range Status  08/24/2024 6.4 6.0 - 8.5 g/dL Final   EGFR (African American)  Date Value Ref Range Status  07/15/2014 >60  Final   GFR calc Af Amer  Date Value Ref Range Status  01/26/2021 51 (L) >59 mL/min/1.73 Final    Comment:    **In accordance with recommendations from the NKF-ASN Task force,**   [facility] is in the process of updating its  eGFR calculation to the   2021 CKD-EPI creatinine equation that estimates kidney function   without a race variable.    eGFR  Date Value Ref Range Status  08/24/2024 55 (L) >59 mL/min/1.73 Final   EGFR (Non-African Amer.)  Date Value Ref Range Status  07/15/2014 >60  Final    Comment:    eGFR values <21mL/min/1.73 m2 may be an indication of chronic kidney disease (CKD). Calculated eGFR is useful in patients with stable renal function. The eGFR calculation will not be reliable in acutely ill patients when serum creatinine is changing rapidly. It is not useful in  patients on dialysis. The eGFR calculation may not be applicable to patients at the low and high extremes of body sizes, pregnant women, and vegetarians.    GFR, Estimated  Date Value Ref Range Status  09/06/2023 53 (L) >60 mL/min Final    Comment:    (NOTE) Calculated using the CKD-EPI Creatinine Equation (2021)             Requested Prescriptions  Pending Prescriptions Disp Refills   ARIPiprazole  (ABILIFY ) 2 MG tablet [Pharmacy Med Name: ARIPIPRAZOLE  2MG  TABLETS] 90 tablet 1    Sig: TAKE 1 TABLET(2 MG)  BY MOUTH DAILY     Not Delegated - Psychiatry:  Antipsychotics - Second Generation (Atypical) - aripiprazole  Failed - 01/10/2025 11:50 AM      Failed - This refill cannot be delegated      Failed - Lipid Panel in normal range within the last 12 months    Cholesterol, Total  Date Value Ref Range Status  08/24/2024 208 (H) 100 - 199 mg/dL Final   LDL Chol Calc (NIH)  Date Value Ref Range Status  08/24/2024 137 (H) 0 - 99 mg/dL Final   HDL  Date Value Ref Range Status  08/24/2024 32 (L) >39 mg/dL Final   Triglycerides  Date Value Ref Range Status  08/24/2024 215 (H) 0 - 149 mg/dL Final         Passed - TSH in normal range and within 360 days    TSH  Date Value Ref Range Status  05/18/2024 2.590 0.450 - 4.500 uIU/mL Final         Passed - Completed PHQ-2 or PHQ-9 in the last 360 days      Passed - Last BP in normal range    BP Readings from Last 1 Encounters:  08/24/24 134/72         Passed - Last Heart Rate in normal range    Pulse Readings from Last 1 Encounters:  08/24/24 71         Passed - Valid encounter within last 6 months    Recent Outpatient Visits           4 months ago Type 2 diabetes mellitus with diabetic neuropathy, without long-term current use of insulin  (HCC)   [place] [facility] Melvin Pao, NP   7 months ago Encounter for [employer] annual wellness exam   [place] [facility] Melvin Pao, NP   11 months ago Mild episode of recurrent major depressive disorder    [facility] Melvin Pao, NP              Passed - CBC within normal limits and completed in the last 12 months    WBC  Date Value Ref Range Status  05/18/2024 7.2 3.4 - 10.8 x10E3/uL Final  09/06/2023 7.8 4.0 - 10.5 K/uL Final   RBC  Date Value Ref Range Status  05/18/2024 5.24 4.14 - 5.80 x10E6/uL Final  09/06/2023 5.05 4.22 - 5.81 MIL/uL Final   Hemoglobin  Date Value Ref Range Status  05/18/2024  13.9 13.0 - 17.7 g/dL Final   Hematocrit  Date Value Ref Range Status  05/18/2024 43.3 37.5 - 51.0 % Final   MCHC  Date Value Ref Range Status  05/18/2024 32.1 31.5 - 35.7 g/dL Final  90/78/7975 66.2 30.0 - 36.0 g/dL Final   [facility]  Date Value Ref Range Status  05/18/2024 26.5 (L) 26.6 - 33.0 pg Final  09/06/2023 26.5 26.0 - 34.0 pg Final   MCV  Date Value Ref Range Status  05/18/2024 83 79 - 97 fL Final  07/01/2014 82 80 - 100 fL Final   No results found for: PLTCOUNTKUC, LABPLAT, POCPLA RDW  Date Value Ref Range Status  05/18/2024 13.4 11.6 - 15.4 % Final  07/01/2014 14.8 (H) 11.5 - 14.5 % Final         Passed - CMP within normal limits and completed in the last 12 months    Albumin   Date Value Ref Range Status  08/24/2024 4.2 3.9 - 4.9 g/dL Final   Alkaline Phosphatase  Date Value Ref Range Status  08/24/2024 75 44 - 121 IU/L Final    Comment:    **Effective August 30, 2024 Alkaline Phosphatase**   reference interval will be changing to:              Age                Male          Male           0 -  5 days         47 - 127       47 - 127           6 - 10 days         29 - 242       29 - 242          11 - 20 days        109 - 357      109 - 357          21 - 30 days         94 - 494       94 - 494           1 -  2 months      149 - 539      149 - 539           3 -  6 months      131 - 452      131 - 452           7 - 11 months      117 - 401      117 - 401   12 months -  6 years       158 - 369      158 - 369           7 - 12 years       150 - 409      150 - 409               13 years       156 - 435       78 - 227  14 years       114 - 375       64 - 161               15 years        88 - 279       56 - 134               16 years        74 - 207       51 - 121               17 years        63 - 161       47 - 113          18 - 20 years        51 - 125       42 - 106          21 - 50 years        47 - 123       41 - 116          51 -  80 years        49 - 135       51 - 125              >80 years        48 - 129       48 - 129    ALT  Date Value Ref Range Status  08/24/2024 16 0 - 44 IU/L Final   AST  Date Value Ref Range Status  08/24/2024 20 0 - 40 IU/L Final   BUN  Date Value Ref Range Status  08/24/2024 14 8 - 27 mg/dL Final  92/68/7984 14 7 - 18 mg/dL Final   Calcium  Date Value Ref Range Status  08/24/2024 9.1 8.6 - 10.2 mg/dL Final   Calcium, Total  Date Value Ref Range Status  07/15/2014 8.3 (L) 8.5 - 10.1 mg/dL Final   CO2  Date Value Ref Range Status  08/24/2024 23 20 - 29 mmol/L Final   Co2  Date Value Ref Range Status  07/15/2014 27 21 - 32 mmol/L Final   Bicarbonate  Date Value Ref Range Status  09/27/2020 26.6 20.0 - 28.0 mmol/L Final   Creatinine  Date Value Ref Range Status  07/15/2014 1.07 0.60 - 1.30 mg/dL Final   Creatinine, Ser  Date Value Ref Range Status  08/24/2024 1.40 (H) 0.76 - 1.27 mg/dL Final   Glucose  Date Value Ref Range Status  08/24/2024 90 70 - 99 mg/dL Final  92/68/7984 893 (H) 65 - 99 mg/dL Final   Glucose, Bld  Date Value Ref Range Status  09/06/2023 128 (H) 70 - 99 mg/dL Final    Comment:    Glucose reference range applies only to samples taken after fasting for at least 8 hours.   Glucose-Capillary  Date Value Ref Range Status  10/19/2020 102 (H) 70 - 99 mg/dL Final    Comment:    Glucose reference range applies only to samples taken after fasting for at least 8 hours.   Potassium  Date Value Ref Range Status  08/24/2024 4.8 3.5 - 5.2 mmol/L Final  07/15/2014 4.4 3.5 - 5.1 mmol/L Final   Sodium  Date Value Ref Range Status  08/24/2024 138 134 - 144 mmol/L Final  07/15/2014 144 136 - 145 mmol/L Final   Bilirubin Total  Date Value Ref Range Status  08/24/2024 0.2 0.0 - 1.2 mg/dL Final   Protein, ur  Date Value Ref Range Status  10/15/2020 30 (A) NEGATIVE mg/dL Final   Protein,UA  Date Value Ref Range Status  10/21/2022 Negative  Negative/Trace Final   Total Protein  Date Value Ref Range Status  08/24/2024 6.4 6.0 - 8.5 g/dL Final   EGFR (African American)  Date Value Ref Range Status  07/15/2014 >60  Final   GFR calc Af Amer  Date Value Ref Range Status  01/26/2021 51 (L) >59 mL/min/1.73 Final    Comment:    **In accordance with recommendations from the NKF-ASN Task force,**   [facility] is in the process of updating its eGFR calculation to the   2021 CKD-EPI creatinine equation that estimates kidney function   without a race variable.    eGFR  Date Value Ref Range Status  08/24/2024 55 (L) >59 mL/min/1.73 Final   EGFR (Non-African Amer.)  Date Value Ref Range Status  07/15/2014 >60  Final    Comment:    eGFR values <41mL/min/1.73 m2 may be an indication of chronic kidney disease (CKD). Calculated eGFR is useful in patients with stable renal function. The eGFR calculation will not be reliable in acutely ill patients when serum creatinine is changing rapidly. It is not useful in  patients on dialysis. The eGFR calculation may not be applicable to patients at the low and high extremes of body sizes, pregnant women, and vegetarians.    GFR, Estimated  Date Value Ref Range Status  09/06/2023 53 (L) >60 mL/min Final    Comment:    (NOTE) Calculated using the CKD-EPI Creatinine Equation (2021)

## 2025-02-21 ENCOUNTER — Ambulatory Visit: Admitting: Nurse Practitioner
